# Patient Record
Sex: Male | Born: 1955 | Hispanic: No | Marital: Married | Smoking: Never smoker
Health system: Southern US, Community
[De-identification: ages and names within clinical notes are randomized; demographics above are authoritative.]

## PROBLEM LIST (undated history)

## (undated) ENCOUNTER — Inpatient Hospital Stay
Admission: EM | Payer: No Typology Code available for payment source | Source: Home / Self Care | Attending: Student in an Organized Health Care Education/Training Program | Admitting: Student in an Organized Health Care Education/Training Program

## (undated) DIAGNOSIS — I2510 Atherosclerotic heart disease of native coronary artery without angina pectoris: Secondary | ICD-10-CM

## (undated) DIAGNOSIS — E785 Hyperlipidemia, unspecified: Secondary | ICD-10-CM

## (undated) DIAGNOSIS — J302 Other seasonal allergic rhinitis: Secondary | ICD-10-CM

## (undated) DIAGNOSIS — E559 Vitamin D deficiency, unspecified: Secondary | ICD-10-CM

## (undated) DIAGNOSIS — H539 Unspecified visual disturbance: Secondary | ICD-10-CM

## (undated) DIAGNOSIS — H9190 Unspecified hearing loss, unspecified ear: Secondary | ICD-10-CM

## (undated) DIAGNOSIS — I219 Acute myocardial infarction, unspecified: Secondary | ICD-10-CM

## (undated) DIAGNOSIS — D689 Coagulation defect, unspecified: Secondary | ICD-10-CM

## (undated) HISTORY — DX: Vitamin D deficiency, unspecified: E55.9

## (undated) HISTORY — DX: Other seasonal allergic rhinitis: J30.2

## (undated) HISTORY — PX: NOSE SURGERY: SHX723

## (undated) HISTORY — DX: Coagulation defect, unspecified: D68.9

## (undated) HISTORY — DX: Hyperlipidemia, unspecified: E78.5

---

## 2020-10-17 ENCOUNTER — Other Ambulatory Visit: Payer: Self-pay

## 2020-10-17 ENCOUNTER — Ambulatory Visit (INDEPENDENT_AMBULATORY_CARE_PROVIDER_SITE_OTHER): Payer: Medicaid Other | Admitting: Internal Medicine

## 2020-10-17 VITALS — BP 134/69 | HR 98 | Temp 98.0°F | Ht 66.0 in | Wt 174.2 lb

## 2020-10-17 DIAGNOSIS — E7800 Pure hypercholesterolemia, unspecified: Secondary | ICD-10-CM

## 2020-10-17 DIAGNOSIS — Z0000 Encounter for general adult medical examination without abnormal findings: Secondary | ICD-10-CM | POA: Diagnosis present

## 2020-10-17 DIAGNOSIS — E559 Vitamin D deficiency, unspecified: Secondary | ICD-10-CM

## 2020-10-17 DIAGNOSIS — N3943 Post-void dribbling: Secondary | ICD-10-CM

## 2020-10-17 DIAGNOSIS — H9192 Unspecified hearing loss, left ear: Secondary | ICD-10-CM

## 2020-10-17 LAB — POCT GLYCOSYLATED HEMOGLOBIN (HGB A1C): Hemoglobin A1C: 5.2 % (ref 4.0–5.6)

## 2020-10-17 LAB — GLUCOSE, CAPILLARY: Glucose-Capillary: 88 mg/dL (ref 70–99)

## 2020-10-17 NOTE — Patient Instructions (Signed)
Thank you for allowing [place] to provide your care today. Today you came in for check up and esrablish care with [place].  Your heart and lung exam are normal.   I order some blood work and will call you with the result. I refer you to ear-nose-throat (ENT) doctor for your hearing loss  Please come back to clinic in clinic in 3 months to follow up with your PCP or sooner as needed. As always, if having severe symptoms, please seek medical attention at emergency room. Should you have any questions or concerns please call the internal medicine clinic at [phone].    Thank you!    ?~    ?    ?    ? ?.  ? ?     ?.  ?   ?  ?? .     ?   ?     ? ?.    ? ~ ??  ~     ??  ?    3  ?  ~??~  ~??~  ~? .  ?     ?   Z  ~?.      ? ?  ~??~ ?   [phone]  ??.
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
## 2020-10-18 LAB — LIPID PANEL
Chol/HDL Ratio: 4.8 ratio (ref 0.0–5.0)
Cholesterol, Total: 207 mg/dL — ABNORMAL HIGH (ref 100–199)
HDL: 43 mg/dL (ref >39)
LDL Chol Calc (NIH): 134 mg/dL — ABNORMAL HIGH (ref 0–99)
Triglycerides: 165 mg/dL — ABNORMAL HIGH (ref 0–149)
VLDL Cholesterol Cal: 30 mg/dL (ref 5–40)

## 2020-10-18 LAB — PSA: Prostate Specific Ag, Serum: 3.2 ng/mL (ref 0.0–4.0)

## 2020-10-18 LAB — VITAMIN D 25 HYDROXY (VIT D DEFICIENCY, FRACTURES): Vit D, 25-Hydroxy: 24.9 ng/mL — ABNORMAL LOW (ref 30.0–100.0)

## 2020-10-19 DIAGNOSIS — Z0000 Encounter for general adult medical examination without abnormal findings: Secondary | ICD-10-CM | POA: Insufficient documentation

## 2020-10-19 DIAGNOSIS — N3943 Post-void dribbling: Secondary | ICD-10-CM | POA: Insufficient documentation

## 2020-10-19 DIAGNOSIS — H9192 Unspecified hearing loss, left ear: Secondary | ICD-10-CM | POA: Insufficient documentation

## 2020-10-19 NOTE — Assessment & Plan Note (Signed)
Patient reports some urinary dribbling at end of urination. Denies any frequency during the day. No difficulty in initiating urination. He would like to have his PSA checked. Explained that we do not need it as a screen test for BPH but will go ahead and order that per patient's request.   -PSA
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
## 2020-10-19 NOTE — Assessment & Plan Note (Addendum)
No lab in the chart and patient states that he has not seen a doctor in past few years for check up. Will order some screeing test. He is outdate for all age appropriate vaccination including COVID.  -HbA1c -Lipid panel -Vit D level -Will discuss screening colonoscopy next visit

## 2020-10-21 ENCOUNTER — Other Ambulatory Visit (HOSPITAL_COMMUNITY): Payer: Self-pay | Admitting: Internal Medicine

## 2020-10-21 MED ORDER — ROSUVASTATIN CALCIUM 20 MG PO TABS: 20.0000 mg | ORAL_TABLET | Freq: Every day | ORAL | 5 refills | Status: DC

## 2020-10-21 MED ORDER — VITAMIN D 50 MCG (2000 UT) PO TABS: 2000.0000 [IU] | ORAL_TABLET | Freq: Every day | ORAL | 1 refills | Status: DC

## 2020-10-21 MED FILL — ROSUVASTATIN CALCIUM 20 MG: 20 | 60 days supply | Qty: 60 | Fill #0

## 2020-10-21 NOTE — Addendum Note (Signed)
Addended byChevis Pretty on: 10/21/2020 09:36 AM   Modules accepted: Orders

## 2020-10-22 NOTE — Progress Notes (Signed)
Internal Medicine Clinic Attending  Case discussed with Dr. Basaraba  At the time of the visit.  We reviewed the resident's history and exam and pertinent patient test results.  I agree with the assessment, diagnosis, and plan of care documented in the resident's note.  

## 2020-10-29 NOTE — Congregational Nurse Program (Signed)
Patient came in today to discuss referrals that were sent out at his last PCP visit on 10/17/2020. He states that he would like to see his PCP again and that the 3 month follow up visit is too far away.   Per the last OV note, the patient has decreased hearing in his left ear and a referral to ENT (Dr. Suszanne Conners) was made. It was formally approved to be sent out on 2/10 because patient's Medicaid needed to be changed to [facility]. Called Dr. Avel Sensor office this morning and their office said that they don't take [facility] Medicaid. Spoke to Referral Coordinator at [facility] who said that they were aware of the problem and had put in a new request to a different office. Will have an appointment next week and that the sponsor will be notified.   Arman Bogus RN BSn PCCN  [facility]regational Nurse [phone]-cell [phone]-office

## 2020-11-11 ENCOUNTER — Telehealth: Payer: Self-pay | Admitting: Pediatric Intensive Care

## 2020-11-11 NOTE — Telephone Encounter (Signed)
Call to [facility] regarding client's ENT referral. Spoke with May. She will send message to referral coordinator to return call. Shann Medal Rn BSN CNP [phone]

## 2020-11-19 ENCOUNTER — Telehealth: Payer: Self-pay | Admitting: Pediatric Intensive Care

## 2020-11-25 ENCOUNTER — Encounter: Payer: Self-pay | Admitting: Pediatric Intensive Care

## 2020-11-25 NOTE — Congregational Nurse Program (Signed)
  Dept: [phone]   Congregational Nurse Program Note  Date of Encounter: 11/25/2020  Past Medical History: No past medical history on file.  Encounter Details:Spoke with client at [facility]- client states he wants a re-check on his cholesterol level. CN reviewed PCP appointment with client and recommends that he has a level drwn at that appointment. Steven Medal RN BSN CNP [phone]

## 2020-12-02 ENCOUNTER — Telehealth: Payer: Self-pay | Admitting: Pediatric Intensive Care

## 2020-12-02 NOTE — Telephone Encounter (Signed)
Call to [facility]- spoke with French Ana. Client's appointment for hearing screen is at  0920 on 3/24 arrive 30 minutes early. Hearing testing is first appointment with ENT to follow at 1000. CN advised clinic of language needs.  Shann Medal RN BSN CNP [phone]

## 2020-12-02 NOTE — Congregational Nurse Program (Signed)
Confirmed appointment for patient at [facility] at 930 12/04/20. Patient's spouse was unsure of date/time of appointment  Arman Bogus RN BSn [facility] Congregational Nurse [phone]-cell [phone]-office

## 2020-12-09 ENCOUNTER — Encounter: Payer: Self-pay | Admitting: Pediatric Intensive Care

## 2020-12-09 NOTE — Congregational Nurse Program (Signed)
  Dept: [phone]   Congregational Nurse Program Note  Date of Encounter: 12/09/2020  Past Medical History: No past medical history on file.  Encounter Details: Patient wanted to recheck blood pressure. Will see Benetta Spar, RN Congregational Nurse to coordinate upcoming appointments.  Arman Bogus RN BSn PCCN  [facility] [phone]-cell [phone]-office

## 2020-12-16 ENCOUNTER — Telehealth: Payer: Self-pay | Admitting: Pediatric Intensive Care

## 2020-12-16 NOTE — Congregational Nurse Program (Signed)
  Dept: [phone]   Congregational Nurse Program Note  Date of Encounter: 12/09/2020  Past Medical History: No past medical history on file.  Encounter Details: Client in to remind CN that he would like his cholesterol re-checked when he gos to his wellness follow up.  Shann Medal RN BSN CNP [phone]

## 2020-12-16 NOTE — Telephone Encounter (Signed)
Call to [facility]. Appointment scheduled for 12/30/2020 at 1200. Shann Medal RN BSN CNP [phone]

## 2020-12-17 NOTE — Telephone Encounter (Signed)
Client left message for CN to return call. Shann Medal RN BSN CNP [phone]

## 2021-01-14 ENCOUNTER — Telehealth: Payer: Self-pay | Admitting: *Deleted

## 2021-01-14 ENCOUNTER — Encounter: Payer: Medicaid Other | Admitting: Student

## 2021-01-14 NOTE — Telephone Encounter (Signed)
Interpretor attempted to contact pt regarding missed appt-no ans-unable to leave message.Steven Spittle Cassady5/4/202211:56 AM

## 2021-01-28 NOTE — Congregational Nurse Program (Signed)
  Dept: [phone]   Congregational Nurse Program Note  Date of Encounter: 01/28/2021  Past Medical History: No past medical history on file.  Encounter Details:  CNP Questionnaire - 01/28/21 1159      Questionnaire   Do you give verbal consent to treat you today? Yes    Visit Setting Church or Organization    Location Patient Served At [facility]    Patient Status Refugee    Insurance Medicaid    Intervention Advocate;Assess (including screenings);Counsel;Educate;Refer    Transportation Need transportation assistance    Medication Have medication insecurities    Referrals PCP - [place]    ED Visit Averted Yes          Steven Malone would like to rescheduled a missed appointment. PCP office contacted and message left for the coordinator to call me back with appoint date and time. Arman Bogus RN BSn PCCN  [facility] [phone]-cell [phone]-office

## 2021-02-09 ENCOUNTER — Encounter (HOSPITAL_COMMUNITY): Payer: Self-pay | Admitting: Emergency Medicine

## 2021-02-09 ENCOUNTER — Emergency Department (HOSPITAL_COMMUNITY)
Admission: EM | Admit: 2021-02-09 | Discharge: 2021-02-09 | Disposition: A | Discharge status: Home / Self Care | Payer: Medicaid Other | Attending: Emergency Medicine | Admitting: Emergency Medicine

## 2021-02-09 ENCOUNTER — Emergency Department (HOSPITAL_COMMUNITY): Payer: Medicaid Other

## 2021-02-09 ENCOUNTER — Telehealth (HOSPITAL_COMMUNITY): Payer: Self-pay | Admitting: Medical

## 2021-02-09 ENCOUNTER — Other Ambulatory Visit: Payer: Self-pay

## 2021-02-09 DIAGNOSIS — R519 Headache, unspecified: Secondary | ICD-10-CM | POA: Diagnosis present

## 2021-02-09 DIAGNOSIS — R930 Abnormal findings on diagnostic imaging of skull and head, not elsewhere classified: Secondary | ICD-10-CM

## 2021-02-09 DIAGNOSIS — J0110 Acute frontal sinusitis, unspecified: Secondary | ICD-10-CM | POA: Diagnosis not present

## 2021-02-09 MED ORDER — METOCLOPRAMIDE HCL 5 MG/ML IJ SOLN
10.0000 mg | Freq: Once | INTRAMUSCULAR | Status: AC
  Administered 2021-02-09: 10 mg via INTRAVENOUS
  Filled 2021-02-09: qty 2

## 2021-02-09 MED ORDER — FLUTICASONE PROPIONATE 50 MCG/ACT NA SUSP: 1.0000 | Freq: Every day | NASAL | 0 refills | Status: AC

## 2021-02-09 MED ORDER — FLUORESCEIN SODIUM 1 MG OP STRP
1.0000 | ORAL_STRIP | Freq: Once | OPHTHALMIC | Status: AC
  Administered 2021-02-09: 1 via OPHTHALMIC
  Filled 2021-02-09: qty 1

## 2021-02-09 MED ORDER — CETIRIZINE HCL 10 MG PO TABS: 10.0000 mg | ORAL_TABLET | Freq: Every day | ORAL | 0 refills | Status: DC

## 2021-02-09 MED ORDER — DIPHENHYDRAMINE HCL 50 MG/ML IJ SOLN
50.0000 mg | Freq: Once | INTRAMUSCULAR | Status: AC
  Administered 2021-02-09: 50 mg via INTRAVENOUS
  Filled 2021-02-09: qty 1

## 2021-02-09 MED ORDER — TETRACAINE HCL 0.5 % OP SOLN
2.0000 [drp] | Freq: Once | OPHTHALMIC | Status: AC
  Administered 2021-02-09: 2 [drp] via OPHTHALMIC
  Filled 2021-02-09: qty 4

## 2021-02-09 MED ORDER — CETIRIZINE HCL 10 MG PO TABS: 10.0000 mg | ORAL_TABLET | Freq: Every day | ORAL | 0 refills | Status: AC

## 2021-02-09 MED ORDER — AMOXICILLIN-POT CLAVULANATE 875-125 MG PO TABS: 1.0000 | ORAL_TABLET | Freq: Two times a day (BID) | ORAL | 0 refills | Status: AC

## 2021-02-09 MED ORDER — FLUTICASONE PROPIONATE 50 MCG/ACT NA SUSP: 1.0000 | Freq: Every day | NASAL | 0 refills | Status: DC

## 2021-02-09 MED ORDER — SODIUM CHLORIDE 0.9 % IV BOLUS
1000.0000 mL | Freq: Once | INTRAVENOUS | Status: AC
  Administered 2021-02-09: 1000 mL via INTRAVENOUS

## 2021-02-09 NOTE — ED Provider Notes (Signed)
[facility]-EMERGENCY DEPT Provider Note   CSN: [id number] Arrival date & time: 02/09/21  1058     History Chief Complaint  Patient presents with  . Eye Pain    Steven Malone is a 65 y.o. male who presents to the ED today with complaint of gradual onset, constant, achy/throbbing, severe, pain around his left eye mostly in the left frontal area that began yesterday around noon. Pt denies any specific eye pain itself, foreign body sensation, vision changes. No trauma to the eye. He mentions that he had a surgery to bilateral eyes in [place] 2 years ago and was concerned; he is unsure what surgery he had specifically but mentions getting his lenses replaced. He took 600 mg Ibuprofen without much relief yesterday prompting ED visit today. He has never experienced this pain in the past. No hx of migraine headaches. Son reports that besides acting painful pt is as baseline. Pt denies fevers, chills, photophobia, phonophobia, nausea, vomiting, confusion, speech changes, unilateral weakness or numbness, or any other associated symptoms.   The history is provided by the patient, a relative and medical records. The history is limited by a language barrier. A language interpreter was used.       History reviewed. No pertinent past medical history.  Patient Active Problem List   Diagnosis Date Noted  . Encounter for preventive care 10/19/2020  . Urinary dribbling 10/19/2020  . Hearing decreased, left 10/19/2020    History reviewed. No pertinent family history.     Home Medications Prior to Admission medications   Medication Sig Start Date End Date Taking? Authorizing Provider  amoxicillin-clavulanate (AUGMENTIN) 875-125 MG tablet Take 1 tablet by mouth every 12 (twelve) hours. 02/09/21  Yes Maryna Yeagle, PA-C  cetirizine (ZYRTEC ALLERGY) 10 MG tablet Take 1 tablet (10 mg total) by mouth daily. 02/09/21 03/11/21 Yes Caidyn Blossom, PA-C  fluticasone (FLONASE)  50 MCG/ACT nasal spray Place 1 spray into both nostrils daily. 02/09/21  Yes Kristelle Cavallaro, PA-C  Cholecalciferol (VITAMIN D) 50 MCG (2000 UT) tablet Take 1 tablet (2,000 Units total) by mouth daily. 10/21/20   Masoudi, Shawna [place], MD  Cholecalciferol (VITAMIN D3) 50 MCG (2000 UT) TABS TAKE 1 TABLET (2,000 UNITS TOTAL) BY MOUTH DAILY. 10/21/20 10/21/21  Masoudi, Elhamalsadat, MD  rosuvastatin (CRESTOR) 20 MG tablet TAKE 1 TABLET (20 MG TOTAL) BY MOUTH DAILY. 10/21/20 10/21/21  MasoudiShawna [place], MD    Allergies    Patient has no known allergies.  Review of Systems   Review of Systems  Constitutional: Negative for chills and fever.  Eyes: Negative for photophobia, discharge, redness, itching and visual disturbance.  Respiratory: Negative for cough.   Gastrointestinal: Negative for nausea and vomiting.  Musculoskeletal: Negative for myalgias.  Neurological: Positive for headaches. Negative for syncope, speech difficulty, weakness and numbness.  All other systems reviewed and are negative.   Physical Exam Updated Vital Signs BP (!) 131/91 (BP Location: Right Arm)   Pulse (!) 59   Temp 98 F (36.7 C) (Oral)   Resp 16   SpO2 98%   Physical Exam Vitals and nursing note reviewed.  Constitutional:      Appearance: He is not ill-appearing or diaphoretic.  HENT:     Head: Normocephalic and atraumatic.  Eyes:     General: No scleral icterus.       Right eye: No discharge.        Left eye: No discharge.     Intraocular pressure: Left eye pressure is 21 mmHg.  Measurements were taken using a handheld tonometer.    Extraocular Movements: Extraocular movements intact.     Conjunctiva/sclera: Conjunctivae normal.     Pupils: Pupils are equal, round, and reactive to light.     Left eye: No corneal abrasion or fluorescein uptake.  Cardiovascular:     Rate and Rhythm: Normal rate and regular rhythm.     Pulses: Normal pulses.  Pulmonary:     Effort: Pulmonary effort is normal.     Breath  sounds: Normal breath sounds. No wheezing, rhonchi or rales.  Abdominal:     Palpations: Abdomen is soft.     Tenderness: There is no abdominal tenderness. There is no guarding or rebound.  Musculoskeletal:     Cervical back: Neck supple.  Skin:    General: Skin is warm and dry.  Neurological:     Mental Status: He is alert.     Comments: Alert and oriented to self, place, time and event.   Speech is fluent, clear without dysarthria or dysphasia.   Strength 5/5 in upper/lower extremities  Sensation intact in upper/lower extremities   Normal gait.  Negative Romberg. No pronator drift.  Normal finger-to-nose and feet tapping.  CN I not tested  CN II grossly intact visual fields bilaterally. Did not visualize posterior eye.   CN III, IV, VI PERRLA and EOMs intact bilaterally  CN V Intact sensation to sharp and light touch to the face  CN VII facial movements symmetric  CN VIII not tested  CN IX, X no uvula deviation, symmetric rise of soft palate  CN XI 5/5 SCM and trapezius strength bilaterally  CN XII Midline tongue protrusion, symmetric L/R movements      ED Results / Procedures / Treatments   Labs (all labs ordered are listed, but only abnormal results are displayed) Labs Reviewed - No data to display  EKG None  Radiology CT Head Wo Contrast  Result Date: 02/09/2021 CLINICAL DATA:  Headache. EXAM: CT HEAD WITHOUT CONTRAST TECHNIQUE: Contiguous axial images were obtained from the base of the skull through the vertex without intravenous contrast. COMPARISON:  None. FINDINGS: Brain: No evidence of acute infarction, hemorrhage, hydrocephalus, extra-axial collection or mass lesion/mass effect. Vascular: No hyperdense vessel or unexpected calcification. Skull: Normal. Negative for fracture or focal lesion. Sinuses/Orbits: Globes and orbits unremarkable. Left frontal sinus is opacified. Moderate ethmoid mucosal thickening. Mild maxillary and sphenoid mucosal thickening. Other:  None. IMPRESSION: 1.  No intracranial abnormality. 2.  Sinus disease. Electronically Signed   By: Amie Portlandavid  Ormond M.D.   On: 02/09/2021 14:09    Procedures Procedures   Medications Ordered in ED Medications  fluorescein ophthalmic strip 1 strip (1 strip Left Eye Given by Other 02/09/21 1356)  tetracaine (PONTOCAINE) 0.5 % ophthalmic solution 2 drop (2 drops Left Eye Given by Other 02/09/21 1356)  metoCLOPramide (REGLAN) injection 10 mg (10 mg Intravenous Given 02/09/21 1308)  diphenhydrAMINE (BENADRYL) injection 50 mg (50 mg Intravenous Given 02/09/21 1309)  sodium chloride 0.9 % bolus 1,000 mL (1,000 mLs Intravenous New Bag/Given 02/09/21 1344)    ED Course  I have reviewed the triage vital signs and the nursing notes.  Pertinent labs & imaging results that were available during my care of the patient were reviewed by me and considered in my medical decision making (see chart for details).    MDM Rules/Calculators/A&P  65 year old Dari speaking male who presents to the ED today complaining of left-sided pain specifically around his eye and into his forehead that began yesterday.  Chief complaint redness eye pain however patient denies any specific eye pain itself and states the pain is only around the eye.  He denies any foreign body sensation.  Denies any visual changes.  Past surgical history of what appears to be possible cataract surgery and lens replacement bilaterally 2 years ago.  On arrival to the ED vitals are stable.  Patient is afebrile, nontachycardic nontachypneic and appears to be in no acute distress.  On my exam there is no conjunctival injection bilaterally, no drainage.  His pupils are equal round and reactive to light.  He denies any pain with extraocular movements.  No signs of preseptal or septal cellulitis at this time.  He has no focal neurodeficits on exam today no meningeal signs.  He denies any other associated symptoms at this time however his  symptoms do seem more like a headache versus eye pain.  Nonetheless it is difficult to assess whether all of the information is accurate due to using an interpreter at this time.  We will plan to assess eye with fluorescein stain as well as Tono-Pen.  Will obtain visual acuity at this time.  We will plan to treat for possible migraine headache with fluids and medications.  Given age and patient never experiencing these symptoms in the past we will plan for CT head at this time.   No fluorescein uptake on exam. Normal IOP.  Prior to CT scan pt received benadryl and reglan and reports significant improvement in his headache. Will await CT results and provide Toradol and fluids with reevaluation.   CT Head: Left frontal sinus is opacified. Moderate ethmoid mucosal  thickening. Mild maxillary and sphenoid mucosal thickening.   Suspect sinus infection is causing pt's discomfort today. Wife reports he has had a lot of issues with "allergies" in the past consistent with sneezing and congestion. Will treat with abx at this time. Pt advised to follow up with PCP for same. They would also like a referral to an ophthalmologist in the area given hx of [place] 2 [place]; will provide information. Pt stable for discharge at this time.   This note was prepared using Dragon voice recognition software and may include unintentional dictation errors due to the inherent limitations of voice recognition software.  Final Clinical Impression(s) / ED Diagnoses Final diagnoses:  Opacification of frontal sinus  Acute frontal sinusitis, recurrence not specified  Bad headache    Rx / DC Orders ED Discharge Orders         Ordered    amoxicillin-clavulanate (AUGMENTIN) 875-125 MG tablet  Every 12 hours        02/09/21 1435    fluticasone (FLONASE) 50 MCG/ACT nasal spray  Daily        02/09/21 1435    cetirizine (ZYRTEC ALLERGY) 10 MG tablet  Daily        02/09/21 1435            Discharge Instructions     Your CT scan showed mucosal thickening and opacification of your sinuses on the left side of your face which is likely causing your headache.   Please pick up antibiotics and take as prescribed for a sinus infection. I have also prescribed Flonase and Zyrtec which will help dry out your sinuses and take care of allergies.  Follow up with Ear Nose and Throat specialist Dr. Marene Lenz for further evaluation of your sinus infection  I have also provided information for an ophthalmologist Dr. Cathey Endow that you can follow up with as needed given previous eye surgery history.   Return to the ED as needed for any new/worsening symptoms       Tanda Rockers, Cordelia Poche 02/09/21 1437    Benjiman Core, MD 02/09/21 [phone]

## 2021-02-09 NOTE — Discharge Instructions (Signed)
Your CT scan showed mucosal thickening and opacification of your sinuses on the left side of your face which is likely causing your headache.   Please pick up antibiotics and take as prescribed for a sinus infection. I have also prescribed Flonase and Zyrtec which will help dry out your sinuses and take care of allergies.   Follow up with Ear Nose and Throat specialist Dr. Marene Lenz for further evaluation of your sinus infection  I have also provided information for an ophthalmologist Dr. Cathey Endow that you can follow up with as needed given previous eye surgery history.   Return to the ED as needed for any new/worsening symptoms

## 2021-02-09 NOTE — ED Triage Notes (Signed)
Pt arrives pov with complaints of severe eye pain on L side. Pt states pain is above and around eye. Pt had operation on eye 2 years ago.

## 2021-02-11 ENCOUNTER — Encounter: Payer: Self-pay | Admitting: Pediatric Intensive Care

## 2021-02-18 ENCOUNTER — Encounter: Payer: Self-pay | Admitting: Pediatric Intensive Care

## 2021-02-18 NOTE — Congregational Nurse Program (Signed)
  Dept: [phone]   Congregational Nurse Program Note  Date of Encounter: 02/11/2021  Past Medical History: No past medical history on file.  Encounter Details:Client visit at [facility]. He has his audiology exam and requests assistance with making an appointment with [facility] through [facility]. CN will contact number provided by client. Shann Medal RN BSN CNP [phone]

## 2021-02-24 NOTE — Congregational Nurse Program (Signed)
  Dept: [phone]   Congregational Nurse Program Note  Date of Encounter: 02/24/2021  Past Medical History: No past medical history on file.  Encounter Details:  Mr Langhans is requesting transportation assistance. He has appointment today at 2:15pm at [facility].  [facility] services scheduled a ride with pick up time 1:40pm. Patient understands instructions and that the taxi will wait for only 5 minutes and he should be ready prior to pick up time.  Arman Bogus RN BSn PCCN  [facility] [phone]-cell [phone]-office

## 2021-03-03 ENCOUNTER — Encounter: Payer: Self-pay | Admitting: Pediatric Intensive Care

## 2021-03-03 ENCOUNTER — Telehealth: Payer: Self-pay | Admitting: Pediatric Intensive Care

## 2021-03-03 NOTE — Telephone Encounter (Signed)
Call to transportation services to set up ride to [facility] appointment for this afternoon at 1600. Shann Medal RN BSN CNP [phone]

## 2021-03-04 ENCOUNTER — Telehealth: Payer: Self-pay | Admitting: Pediatric Intensive Care

## 2021-03-04 NOTE — Telephone Encounter (Signed)
Call to [facility]- client missed appointment in May and needs follow up. Schedule is not open at this time. Call back 7/5 to make appointment. Shann Medal Rn BSN CNP [phone]

## 2021-03-11 ENCOUNTER — Encounter: Payer: Self-pay | Admitting: Pediatric Intensive Care

## 2021-03-12 NOTE — Congregational Nurse Program (Signed)
  Dept: [phone]   Congregational Nurse Program Note  Date of Encounter: 02/18/2021  Past Medical History: No past medical history on file.  Encounter Details: Encounter to assist client with application for hearing aid. CN advised client that [facility] will call tomorrow at 1000 to screen for eligibility. Shann Medal RN BSN CNP [phone]

## 2021-03-12 NOTE — Congregational Nurse Program (Signed)
  Dept: [phone]   Congregational Nurse Program Note  Date of Encounter: 03/11/2021  Past Medical History: No past medical history on file.  Encounter Details: CN advised client that she is working with his CM at [facility] to schedule a dental appointment. Also explained that CN will call [facility] next week to schedule follow up appointment with PCP. Shann Medal RN BSN CNP [phone]

## 2021-03-12 NOTE — Congregational Nurse Program (Signed)
  Dept: [phone]   Congregational Nurse Program Note  Date of Encounter: 03/03/2021  Past Medical History: No past medical history on file.  Encounter Details: Encounter with Dari interpreter provided by [facility]. Client has appointment scheduled with [facility] this afternoon. He has continued complaints of dental pain on left side. CN advised she will call dental primary to determine when client needs to return to oral surgeon. Shann Medal RN BSN CNP [phone]

## 2021-03-25 ENCOUNTER — Telehealth: Payer: Self-pay | Admitting: Pediatric Intensive Care

## 2021-03-25 NOTE — Telephone Encounter (Signed)
Call to [facility] to schedule follow up appointment for client. Appointment made for 7/26 at 2:15.  Shann Medal RN BSN CNP [phone]

## 2021-04-06 ENCOUNTER — Telehealth: Payer: Self-pay | Admitting: Pediatric Intensive Care

## 2021-04-06 NOTE — Telephone Encounter (Signed)
Left message for client tha this CN has scheduled an [employer]oad ride for his appointment tomorrow at [facility]. Shann Medal RN BSN CNP [phone]

## 2021-04-06 NOTE — Telephone Encounter (Signed)
Call to client's wide to confirm appointment for client tomorrow. CN advised that Benedetto Goad ride is arranged. Shann Medal RN BSN CNP [phone]

## 2021-04-07 ENCOUNTER — Telehealth: Payer: Self-pay | Admitting: Pediatric Intensive Care

## 2021-04-07 ENCOUNTER — Other Ambulatory Visit (HOSPITAL_COMMUNITY): Payer: Self-pay

## 2021-04-07 ENCOUNTER — Ambulatory Visit (INDEPENDENT_AMBULATORY_CARE_PROVIDER_SITE_OTHER): Payer: Medicaid Other | Admitting: Internal Medicine

## 2021-04-07 ENCOUNTER — Encounter: Payer: Self-pay | Admitting: Internal Medicine

## 2021-04-07 VITALS — BP 102/71 | HR 62 | Temp 97.5°F | Ht 66.0 in | Wt 166.5 lb

## 2021-04-07 DIAGNOSIS — Z23 Encounter for immunization: Secondary | ICD-10-CM | POA: Diagnosis present

## 2021-04-07 DIAGNOSIS — H9192 Unspecified hearing loss, left ear: Secondary | ICD-10-CM | POA: Diagnosis not present

## 2021-04-07 DIAGNOSIS — Z1159 Encounter for screening for other viral diseases: Secondary | ICD-10-CM

## 2021-04-07 DIAGNOSIS — E785 Hyperlipidemia, unspecified: Secondary | ICD-10-CM

## 2021-04-07 DIAGNOSIS — E7800 Pure hypercholesterolemia, unspecified: Secondary | ICD-10-CM

## 2021-04-07 DIAGNOSIS — Z0000 Encounter for general adult medical examination without abnormal findings: Secondary | ICD-10-CM

## 2021-04-07 DIAGNOSIS — E559 Vitamin D deficiency, unspecified: Secondary | ICD-10-CM

## 2021-04-07 MED ORDER — VITAMIN D 50 MCG (2000 UT) PO TABS
2000.0000 [IU] | ORAL_TABLET | Freq: Every day | ORAL | 3 refills | Status: DC
  Filled 2021-04-07: qty 60, 60d supply, fill #0

## 2021-04-07 MED ORDER — ZOSTER VAC RECOMB ADJUVANTED 50 MCG/0.5ML IM SUSR
0.5000 mL | Freq: Once | INTRAMUSCULAR | 0 refills | Status: DC
  Filled 2021-04-07 (×2): qty 0.5, 1d supply, fill #0

## 2021-04-07 MED ORDER — ROSUVASTATIN CALCIUM 20 MG PO TABS
20.0000 mg | ORAL_TABLET | Freq: Every day | ORAL | 3 refills | Status: AC
  Filled 2021-04-07 (×2): qty 90, 90d supply, fill #0

## 2021-04-07 MED ORDER — ROSUVASTATIN CALCIUM 20 MG PO TABS
20.0000 mg | ORAL_TABLET | Freq: Every day | ORAL | 5 refills | Status: DC
  Filled 2021-04-07: qty 60, 60d supply, fill #0

## 2021-04-07 MED ORDER — VITAMIN D 50 MCG (2000 UT) PO TABS
2000.0000 [IU] | ORAL_TABLET | Freq: Every day | ORAL | 3 refills | Status: AC
  Filled 2021-04-07: qty 90, 90d supply, fill #0

## 2021-04-07 MED ORDER — ZOSTER VAC RECOMB ADJUVANTED 50 MCG/0.5ML IM SUSR
0.5000 mL | INTRAMUSCULAR | 1 refills | Status: AC
  Filled 2021-04-07: qty 0.5, 60d supply, fill #0
  Filled 2021-04-07: qty 0.5, 1d supply, fill #0

## 2021-04-07 NOTE — Patient Instructions (Signed)
?? ????? ????? ?? ?????? ????? ?????? ????? ??????. ?? ?????? ??? ???????! ????? ?? ???? ????? ????? ??? ?? ???? ????? ?????????? ????? ??????? ? ?????????? ?? ???? ??? ????? ???? ? ????? ?? ?? ???? ?? ??? ?????? ?????? ???? ?????. ???? ??????? ?? ?? ???? ??? ??? ?? ?????? ?? ????? ?? ????? ? ???? ?? ????. ?? ??? ???? ????? ????!  ?? ????? ??? ?? ???? ??? ????? ???? ??:  ??????? ??????????: ???? ?????: ???? ????? ??????? ???????? ??????   C  ????? ??? ????? ??? ?????: ????? ???? ????? ???????  ???????: ?????? ????? ???? ???????? ???????????  ??????? ??????: ??? ??  ??????: ?? 6 ???.  ?? ??? ????? ?????: ?? ???? ????? ?? ???? ???? ?? ??????? ????? ?? ?????? ?? ?? ????? [phone] ??? ???? 9 ??? ?? 5 ???????? ? ??? ?? ????? ???? ?? ????? [phone] ???? ?????? ? ?? ?????? ????? ?? ???? ???????. ??? ????? ?? ???? ?? ?? ??? ??????? ????? ?????? ???? ?? 911 ???? ??????.  ????? ???? DO   Thank you for visiting the Internal Medicine Clinic today. It was a pleasure to meet you! Today we discussed your general health including previous lab results, different vaccines and tests that you are due for, and how well you are taking care of yourself overall. I am so glad to hear that you are eating well, drinking a lot of water, and exercising. Keep up the great work!  I have ordered the following for you:  Lab orders: Lipid panel: to check your cholesterol Hepatitis C screening  Vaccines given today: Tetanus shot Pneumonia shot  Referrals: Gastroenterology for a screening colonoscopy  Medication changes: None  Follow-up: In 6 months.  Remember: If you have any questions or concerns, please call our clinic at [phone] between 9am-5pm and after hours call [phone] and ask for the internal medicine resident on call. If you feel you are having a medical emergency please call 911.  Champ Mungo, DO

## 2021-04-07 NOTE — Progress Notes (Signed)
   CC: check-up  HPI:  Mr.Steven Malone is a 65 y.o. male with a past medical history of hyperlipidemia and vitamin D deficiency who presents today for a follow-up and requesting medication refills. He has no complaints at this time. His only concern is making sure his lab work is up to date.  Past Medical History:  Diagnosis Date   Hyperlipidemia    Vitamin D deficiency    Review of Systems:  Review of Systems  Constitutional:  Negative for chills and fever.  HENT:  Positive for hearing loss.   Respiratory:  Negative for shortness of breath.   Cardiovascular:  Negative for chest pain and leg swelling.  Gastrointestinal:  Negative for abdominal pain, constipation, diarrhea, nausea and vomiting.  Genitourinary:  Negative for dysuria.  Musculoskeletal:  Negative for myalgias.  Neurological:  Negative for dizziness and headaches.    Physical Exam:  Vitals:   04/07/21 1420  BP: 102/71  Pulse: 62  Temp: (!) 97.5 F (36.4 C)  TempSrc: Oral  SpO2: 98%  Weight: 166 lb 8 oz (75.5 kg)  Height: 5\' 6"  (1.676 m)   Physical Exam Vitals and nursing note reviewed.  Constitutional:      Appearance: Normal appearance.  Cardiovascular:     Rate and Rhythm: Normal rate and regular rhythm.     Heart sounds: Normal heart sounds.  Pulmonary:     Effort: Pulmonary effort is normal.     Breath sounds: Normal breath sounds.  Abdominal:     General: Abdomen is flat.     Palpations: Abdomen is soft.  Skin:    General: Skin is warm and dry.  Neurological:     General: No focal deficit present.     Mental Status: He is alert and oriented to person, place, and time.  Psychiatric:        Mood and Affect: Mood normal.        Behavior: Behavior normal.     Assessment & Plan:   See Encounters Tab for problem based charting.  Patient discussed with Dr. 

## 2021-04-07 NOTE — Telephone Encounter (Signed)
Call to transportation services to set up ride to dental appointment.  Shann Medal RN BSN CNP [phone]

## 2021-04-07 NOTE — Telephone Encounter (Signed)
Received message from client that he has a dental appointment scheduled for today at 1230. He is concerned that he will not be able to go to his dental appointment and his PCP appointment this afternoon. CN will call the dentists to determine the length of the appointment. Shann Medal RN BSN CNP [phone]

## 2021-04-07 NOTE — Assessment & Plan Note (Signed)
Tdap given in clinic today.

## 2021-04-07 NOTE — Assessment & Plan Note (Signed)
Patient followed by ENT, audiology.

## 2021-04-07 NOTE — Assessment & Plan Note (Addendum)
Reassured patient that PSA to check his prostate and Hb A1c to check for diabetes were normal when they were checked in February, and that these are labs we will be happy to monitor for him.  Patient needs following care gap resolution today: Referral to GI for colonoscopy

## 2021-04-07 NOTE — Assessment & Plan Note (Signed)
Hepatitis C screening labs drawn today.

## 2021-04-07 NOTE — Assessment & Plan Note (Signed)
Vitamin D was 24.9 in 10/2020. Patient states that at this time he was experiencing leg pain that resolved with initiation of vitamin D supplementation. Will continue him on vitamin D supplementation.

## 2021-04-07 NOTE — Assessment & Plan Note (Signed)
Prevnar 20 given today. 

## 2021-04-07 NOTE — Assessment & Plan Note (Addendum)
Patient started on rosuvastatin 20 mg in 10/2020 after he was found to have elevated cholesterol, triglycerides, and LDL. ASCVD is 11.2%.  - Recheck lipid panel - Patient does state that he has been out of his medication for a few weeks. I will send in refills for this today.  Addendum 04/10/2021: LDL mildly elevated at 103. Patient admits to being out of his medication for a few weeks. We will plan to recheck at follow-up visit once he is reestablished on regular dosing.

## 2021-04-07 NOTE — Telephone Encounter (Signed)
Message to client with Benedetto Goad pick up times. CN advised to call/text if difficulty with ride from dentists office. Client understands. Shann Medal RN BSN CNP [phone]

## 2021-04-07 NOTE — Assessment & Plan Note (Signed)
Prescription for shingles vaccine sent to pharmacy.

## 2021-04-07 NOTE — Telephone Encounter (Signed)
Call to client's dentists to confirm appointment and determine appointment length. Client has 30 minute appointment. Shann Medal RN BSN CNP [phone]

## 2021-04-08 ENCOUNTER — Other Ambulatory Visit (HOSPITAL_COMMUNITY): Payer: Self-pay

## 2021-04-08 LAB — LIPID PANEL
Chol/HDL Ratio: 3.5 ratio (ref 0.0–5.0)
Cholesterol, Total: 161 mg/dL (ref 100–199)
HDL: 46 mg/dL (ref >39)
LDL Chol Calc (NIH): 103 mg/dL — ABNORMAL HIGH (ref 0–99)
Triglycerides: 60 mg/dL (ref 0–149)
VLDL Cholesterol Cal: 12 mg/dL (ref 5–40)

## 2021-04-08 LAB — HEPATITIS C ANTIBODY: Hep C Virus Ab: 0.2 s/co ratio (ref 0.0–0.9)

## 2021-04-12 NOTE — Progress Notes (Signed)
Internal Medicine Clinic Attending  I saw and evaluated the patient.  I personally confirmed the key portions of the history and exam documented by Dr.  Dean  and I reviewed pertinent patient test results.  The assessment, diagnosis, and plan were formulated together and I agree with the documentation in the resident's note.  

## 2021-04-15 ENCOUNTER — Other Ambulatory Visit (HOSPITAL_COMMUNITY): Payer: Self-pay

## 2021-05-12 ENCOUNTER — Telehealth: Payer: Self-pay | Admitting: Pediatric Intensive Care

## 2021-05-12 NOTE — Telephone Encounter (Signed)
Call to [facility]/Audiology in [facility]. Spoke with receptionist. Leotis Shames in benefits is calling [facility] to see if client is eligible for screening and hearing aid through Medicaid. CN gave number for CN to return call at [phone]. Shann Medal RN BSN CNP [phone]

## 2021-05-12 NOTE — Telephone Encounter (Signed)
Call from Leotis Shames, Psychiatric nurse. Client is eligible for hearing aids x2 through [facility]. This will require a prior authorization. Client will need to been evaluated in their office. The evaluation appointment is covered through Medicaid benefits. Shann Medal RN BSN CNP [phone]

## 2021-05-12 NOTE — Telephone Encounter (Signed)
Call to [facility]. Spoke with receptionist to determine if client was actually seen for ENT/audiology appointments on Friday in [place] office. She explained that client would be charged for audiology appointment in that office as they don't accept [facility] Medicaid. Client did not have ENT or audiology appointment. His family member was notified that he would have to be evaluated in [place] for the state hearing aid program. Client has appointment with audiology in [facility]. CN will call [place] office regarding appointment. Shann Medal RN BSN CNP [phone]

## 2021-05-13 ENCOUNTER — Telehealth: Payer: Self-pay | Admitting: Pediatric Intensive Care

## 2021-05-13 NOTE — Telephone Encounter (Signed)
Call from Dr Renata Caprice (audiologist) regarding process for securing bilateral hearing aids for client. She explained that [facility] contracts with 3rd party [employer]. Client will need to go to appointment at [facility]. She communicated with Dr Wynona Neat during call. She will submit prior authorization to [facility] based on current exam. Marcy Panning provider will communicate date/time for fitting with CN. Please call [place] office if no response by next Wednesday. Shann Medal RN BSN CNP [phone]

## 2021-05-13 NOTE — Telephone Encounter (Signed)
Call from client's daughter in law. Updated [name] on status of hearing aids based on conversation with Manufacturing systems engineer at [employer]. She will go to appointment with client on 9/6. Shann Medal RN BSN CNP [phone]

## 2021-05-20 ENCOUNTER — Telehealth: Payer: Self-pay | Admitting: Pediatric Intensive Care

## 2021-05-20 NOTE — Telephone Encounter (Signed)
Left message for Dr Renata Caprice, audiologist at [facility]/Audiology in [facility]. Regarding next appointment for client at [facility]. Shann Medal RN BSN CNP [phone]

## 2021-06-04 ENCOUNTER — Telehealth: Payer: Self-pay | Admitting: Pediatric Intensive Care

## 2021-06-04 NOTE — Telephone Encounter (Signed)
Call to [employer], case manager at [facility] She states that client and family said that client has an appointment at [facility] today. Family stated that this may be for hearing aid fitting. CN advised that this office may not do the fitting (per DR Renata Caprice at that office) CN will call [place] office and clarify appointment. Shann Medal RN BSN CNP [phone]

## 2021-06-23 ENCOUNTER — Encounter: Payer: Self-pay | Admitting: Pediatric Intensive Care

## 2021-06-23 NOTE — Congregational Nurse Program (Signed)
  Dept: [phone]   Congregational Nurse Program Note  Date of Encounter: 06/23/2021  Past Medical History: Past Medical History:  Diagnosis Date   Hyperlipidemia    Vitamin D deficiency     Encounter Details: Call to client's dental provider to make appointment for client. Text message to client regarding appointment. Shann Medal RN BSN CCNP [phone]

## 2021-06-25 ENCOUNTER — Telehealth: Payer: Self-pay | Admitting: Pediatric Intensive Care

## 2021-06-25 NOTE — Telephone Encounter (Signed)
Call to Dr Ocie Doyne DDS. This is the number that client provided for dental care. The client is not established at this clinic. First available appointment for new patient is 12/19. Will advise client. Shann Medal RN BSN CNP [phone]

## 2021-07-02 ENCOUNTER — Encounter: Payer: Self-pay | Admitting: Pediatric Intensive Care

## 2021-07-02 NOTE — Congregational Nurse Program (Signed)
  Dept: [phone]   Congregational Nurse Program Note  Date of Encounter: 07/02/2021  Past Medical History: Past Medical History:  Diagnosis Date   Hyperlipidemia    Vitamin D deficiency     Encounter Details: Notified client that Steven Malone ride has been set up for his dental appointment on 10/26. His estimated pickup time in 1230. Client understands. His mobile number has been updated in Epic and transportation database.  Shann Medal RN BSN CCNP [phone]

## 2021-07-08 ENCOUNTER — Encounter: Payer: Self-pay | Admitting: Pediatric Intensive Care

## 2021-07-09 NOTE — Congregational Nurse Program (Signed)
  Dept: [phone]   Congregational Nurse Program Note  Date of Encounter: 07/08/2021  Past Medical History: Past Medical History:  Diagnosis Date   Hyperlipidemia    Vitamin D deficiency     Encounter Details:  CNP Questionnaire - 07/08/21 1340       Questionnaire   Do you give verbal consent to treat you today? Yes    Location Patient Served  [facility]    Visit Setting Phone/Text/Email    Patient Status Refugee    Insurance [facility]    Insurance Referral N/A    Medication N/A    Medical Provider Yes    Screening Referrals N/A    Medical Referral N/A    Medical Appointment Made N/A    Food N/A    Transportation Need transportation assistance;Provided transportation assistance    Housing/Utilities N/A    Interpersonal Safety N/A    Intervention Case Management    ED Visit Averted N/A    Life-Saving Intervention Made N/A    Visit Setting Phone/Text/Email            Received text from client that he needs assistance with scheduling [employer]ast for ride from dental appointment. CN called transportation services and returned text to client that he would receive a text message when the ride was available. Shann Medal RN BSN CNP [phone]

## 2022-01-11 ENCOUNTER — Ambulatory Visit
Admission: RE | Admit: 2022-01-11 | Discharge: 2022-01-11 | Disposition: A | Discharge status: Home / Self Care | Payer: No Typology Code available for payment source | Source: Ambulatory Visit | Attending: Family Medicine | Admitting: Family Medicine

## 2022-01-11 ENCOUNTER — Encounter (INDEPENDENT_AMBULATORY_CARE_PROVIDER_SITE_OTHER): Payer: Self-pay | Admitting: Family Medicine

## 2022-01-11 ENCOUNTER — Ambulatory Visit (INDEPENDENT_AMBULATORY_CARE_PROVIDER_SITE_OTHER): Payer: No Typology Code available for payment source | Admitting: Family Medicine

## 2022-01-11 VITALS — BP 124/76 | HR 76 | Temp 98.2°F | Resp 16 | Wt 168.0 lb

## 2022-01-11 DIAGNOSIS — Z13220 Encounter for screening for lipoid disorders: Secondary | ICD-10-CM

## 2022-01-11 DIAGNOSIS — Z131 Encounter for screening for diabetes mellitus: Secondary | ICD-10-CM

## 2022-01-11 DIAGNOSIS — Z974 Presence of external hearing-aid: Secondary | ICD-10-CM | POA: Insufficient documentation

## 2022-01-11 DIAGNOSIS — M25561 Pain in right knee: Secondary | ICD-10-CM | POA: Insufficient documentation

## 2022-01-11 DIAGNOSIS — G8929 Other chronic pain: Secondary | ICD-10-CM | POA: Insufficient documentation

## 2022-01-11 DIAGNOSIS — K625 Hemorrhage of anus and rectum: Secondary | ICD-10-CM

## 2022-01-11 DIAGNOSIS — M25562 Pain in left knee: Secondary | ICD-10-CM | POA: Insufficient documentation

## 2022-01-11 LAB — COMPREHENSIVE METABOLIC PANEL
ALT: 17 U/L (ref 0–55)
AST (SGOT): 21 U/L (ref 5–41)
Albumin/Globulin Ratio: 1.2 (ref 0.9–2.2)
Albumin: 4.1 g/dL (ref 3.5–5.0)
Alkaline Phosphatase: 87 U/L (ref 37–117)
Anion Gap: 4 — ABNORMAL LOW (ref 5.0–15.0)
BUN: 12 mg/dL (ref 9.0–28.0)
Bilirubin, Total: 0.3 mg/dL (ref 0.2–1.2)
CO2: 30 mEq/L — ABNORMAL HIGH (ref 17–29)
Calcium: 9.7 mg/dL (ref 8.5–10.5)
Chloride: 105 mEq/L (ref 99–111)
Creatinine: 0.8 mg/dL (ref 0.5–1.5)
Globulin: 3.3 g/dL (ref 2.0–3.6)
Glucose: 93 mg/dL (ref 70–100)
Potassium: 4.6 mEq/L (ref 3.5–5.3)
Protein, Total: 7.4 g/dL (ref 6.0–8.3)
Sodium: 139 mEq/L (ref 135–145)

## 2022-01-11 LAB — CBC AND DIFFERENTIAL
Absolute NRBC: 0 10*3/uL (ref 0.00–0.00)
Basophils Absolute Automated: 0.03 10*3/uL (ref 0.00–0.08)
Basophils Automated: 0.5 %
Eosinophils Absolute Automated: 0.42 10*3/uL (ref 0.00–0.44)
Eosinophils Automated: 6.3 %
Hematocrit: 44.7 % (ref 37.6–49.6)
Hgb: 14.4 g/dL (ref 12.5–17.1)
Immature Granulocytes Absolute: 0.01 10*3/uL (ref 0.00–0.07)
Immature Granulocytes: 0.2 %
Instrument Absolute Neutrophil Count: 3.58 10*3/uL (ref 1.10–6.33)
Lymphocytes Absolute Automated: 2.1 10*3/uL (ref 0.42–3.22)
Lymphocytes Automated: 31.6 %
MCH: 29 pg (ref 25.1–33.5)
MCHC: 32.2 g/dL (ref 31.5–35.8)
MCV: 89.9 fL (ref 78.0–96.0)
MPV: 11.1 fL (ref 8.9–12.5)
Monocytes Absolute Automated: 0.51 10*3/uL (ref 0.21–0.85)
Monocytes: 7.7 %
Neutrophils Absolute: 3.58 10*3/uL (ref 1.10–6.33)
Neutrophils: 53.7 %
Nucleated RBC: 0 /100 WBC (ref 0.0–0.0)
Platelets: 304 10*3/uL (ref 142–346)
RBC: 4.97 10*6/uL (ref 4.20–5.90)
RDW: 13 % (ref 11–15)
WBC: 6.65 10*3/uL (ref 3.10–9.50)

## 2022-01-11 LAB — HEMOLYSIS INDEX: Hemolysis Index: 4 Index (ref 0–24)

## 2022-01-11 LAB — GFR: EGFR: >60

## 2022-01-11 LAB — HEMOGLOBIN A1C
Average Estimated Glucose: 99.7 mg/dL
Hemoglobin A1C: 5.1 % (ref 4.6–5.6)

## 2022-01-11 LAB — LIPID PANEL
Cholesterol / HDL Ratio: 3.8 Index
Cholesterol: 173 mg/dL (ref 0–199)
HDL: 45 mg/dL (ref 40–9999)
LDL Calculated: 109 mg/dL — ABNORMAL HIGH (ref 0–99)
Triglycerides: 95 mg/dL (ref 34–149)
VLDL Calculated: 19 mg/dL (ref 10–40)

## 2022-01-11 NOTE — Progress Notes (Signed)
[facility]                       Date of Exam: 01/11/2022 2:46 PM        Patient ID: [name] [name] is a 66 y.o. male.  Attending Physician: Oswaldo Done, MD        Chief Complaint:    Chief Complaint   Patient presents with    Establish Care    Leg Pain    Rectal Bleeding         Assessment / Plan :    1. Bilateral chronic knee pain  Chronic, possibly has arthritis, will get xrays checked, consider use of nsaids prn. Will check CMP first  - XR Knee Left 4+ Views; Future  - Comprehensive metabolic panel    2. Blood per rectum  Chronic, possibly due to hemorrhoids, will check CBC, overdue for colonoscopy, will send referral  - Referral to Gastroenterology ([place]); Future  - CBC and differential    3. Screening for lipid disorders  Will get screening done.  - Lipid panel    4. Screening for diabetes mellitus  Will get screening done.  - Hemoglobin A1C    5. Uses hearing aid  History of hearing loss.                 HPI:    HPI  Patient is new to the practice here to establish care, speaks Dari/Farsi, we have video translator.     Used to live in [place]. Moved to [place] last August 2021. He did not have a usual provider would travel to [place] to get health care as needed. Reports that he generally is healthy.     Bilateral knee pain  Left is worse than the right. Notices this more when the weather is cold. Has been going on for 3-4 years. Has been using clothing to keep it warm. No pain currently. Tries to walk for exercise.     Rectal bleeding  Has had some blood in stool when he has a bowel movement. Has been going on for 2 years now. No black stool. Bleeding is when he wipes.       Wants to get cholesterol and blood work checked. No prior history of diabetes or hypertension. Not taking medications.     He has hearing loss and is using hearing aids.                  Problem List:    Patient Active Problem List   Diagnosis    Uses hearing aid    Bilateral chronic knee pain              Current Meds:    No outpatient medications have been marked as taking for the 01/11/22 encounter (Office Visit) with Kensley Lares, Emogene Morgan, MD.          Allergies:    No Known Allergies          Past Surgical History:    Past Surgical History:   Procedure Laterality Date    NOSE SURGERY             Family History:    History reviewed. No pertinent family history.        Social History:    Social History     Tobacco Use    Smoking status: Never    Smokeless tobacco: Never   Vaping Use  Vaping status: Never Used   Substance Use Topics    Alcohol use: Not Currently    Drug use: Never           The following sections were reviewed this encounter by the provider:   Tobacco  Allergies  Meds  Problems  Med Hx  Surg Hx  Fam Hx                The following sections were reviewed this encounter by the provider:   Tobacco  Allergies  Meds  Problems  Med Hx  Surg Hx  Fam Hx             ROS:    Review of Systems   Constitutional:  Negative for chills and fever.   Gastrointestinal:  Negative for nausea and vomiting.                Vital Signs:    BP 124/76   Pulse 76   Temp 98.2 F (36.8 C) (Temporal)   Resp 16   Wt 76.2 kg (168 lb)   SpO2 98%          Physical Exam:    Physical Exam  Constitutional:       Appearance: Normal appearance.   Cardiovascular:      Rate and Rhythm: Normal rate and regular rhythm.   Pulmonary:      Effort: Pulmonary effort is normal. No respiratory distress.      Breath sounds: Normal breath sounds.   Abdominal:      General: Abdomen is flat. There is no distension.      Palpations: Abdomen is soft.      Tenderness: There is no abdominal tenderness.   Neurological:      General: No focal deficit present.      Mental Status: He is alert and oriented to person, place, and time.                        Follow-up:    Return in about 6 weeks (around 02/22/2022) for knee pain and lab review 30 minutes.         [address] R Cheronda Erck III, MD

## 2022-02-02 ENCOUNTER — Ambulatory Visit (INDEPENDENT_AMBULATORY_CARE_PROVIDER_SITE_OTHER): Payer: No Typology Code available for payment source | Admitting: Gastroenterology

## 2022-02-02 NOTE — Progress Notes (Deleted)
Jule Economy MD, MS.  [address] [place], [place] [address]  Appointments: [phone]    Reason for Consultation:   Rectal bleeding    Assessment and Plan:   Rectal bleeding was discussed in detail with the patient. Potential etiologies include anorectal pathology (such as hemorrhoids, anal fissure, etc), diverticular bleed, large/advanced polyps, colorectal mass, vascular lesion, colitis/proctitis. Further evaluation with colonoscopy is recommended, and additional recommendations may be made based on the results.      The colonoscopy procedure was explained to the patient, including indication, alternatives, possible benefits, and potential risks (including but not limited to bleeding, infection, perforation, aspiration, splenic avulsion, anesthesia complications such as slowed breathing and lowered blood pressure). The patient appeared to understand and agrees to proceed.      History:   [name] [name] is a 66 y.o. male who presents to the office       denies any fevers, chills, recent weight change, nausea, vomiting, heartburn, dysphagia, odynophagia, change in appetite, diarrhea, constipation, hematochezia, melena or jaundice.       minutes was spent on this encounter.  This includes the time spent reviewing pathology and imaging reports prior to the encounter as well as time spent discussing the care with other physicians and also face to face time with the patient.       Past Medical History:     Past Medical History:   Diagnosis Date    Seasonal allergic rhinitis        Past Surgical History:     Past Surgical History:   Procedure Laterality Date    NOSE SURGERY         Family History:   No family history on file.    Social History:     Social History     Socioeconomic History    Marital status: Married   Occupational History    Occupation: retired     Comment: Engineer, building services   Tobacco Use    Smoking status: Never    Smokeless tobacco: Never   Vaping Use    Vaping status: Never Used    Substance and Sexual Activity    Alcohol use: Not Currently    Drug use: Never   Social History Narrative    Lives with wife, daughter in law and son.     And 2 sons 2 daughter         Allergies:   No Known Allergies    Medications:   No current outpatient medications on file.    Review of Systems:   Constitutional:No fever,chills,weight loss or gain.  Integument:No skin rashes or lesions.  Eyes:No changes in vision  ENMT:No changes in hearing,nasal discharge,sore throat,oral lesions.  Respiratory:No shortness of breath or cough.  Cardiovascular:No chest pain or palpitations.  Genitourinary:No nocturia,urinary frequency, or dysuria.  Gastrointestinal:See HPI.  Musculoskeletal:No back or joint pain.  Neurologic:No migraine headaches,numbness, or tingling.  Endocrine:No polydipsia,cold or heat intolerance.  Hematologic:No bleeding or bruising.  Psychiatric:No depression or anxiety.     Physical Exam:   There were no vitals filed for this visit.     General appearance - Well developed and well nourished. Normal gait.  HEENT- Normocephalic. Eomi. Sclera anicteric. Normal hearing. Nares normal.  Oropharynx normal.  Neck - Supple.  No thyromegaly.  No cervical lymphadenopathy.  Chest - clear to percussion and auscultation.  No wheeze, rhonchi, or rales.  Heart - regular rate and rhythm without murmurs, gallops, or rubs.  Abdomen - normal bowel sounds.  No focal tenderness to palpation. No hepatosplenomegaly.  No masses. No ascites.  Rectal - deferred until time of colonoscopy.  Musculoskeletal - normal range of motion of arms and legs.  Extremities - no clubbing, cyanosis, or edema.  Skin - no rashes or lesions.  Neurologic - Alert and oriented to person, place and time.  No focal motor or sensory deficits.  No asterixis.  Recent and remote memory normal.

## 2022-02-22 ENCOUNTER — Encounter (INDEPENDENT_AMBULATORY_CARE_PROVIDER_SITE_OTHER): Payer: Self-pay

## 2022-02-22 ENCOUNTER — Ambulatory Visit (INDEPENDENT_AMBULATORY_CARE_PROVIDER_SITE_OTHER): Payer: No Typology Code available for payment source | Admitting: Family Medicine

## 2022-03-11 ENCOUNTER — Ambulatory Visit (INDEPENDENT_AMBULATORY_CARE_PROVIDER_SITE_OTHER): Payer: No Typology Code available for payment source | Admitting: Gastroenterology

## 2022-03-11 ENCOUNTER — Encounter (INDEPENDENT_AMBULATORY_CARE_PROVIDER_SITE_OTHER): Payer: Self-pay | Admitting: Gastroenterology

## 2022-03-11 ENCOUNTER — Telehealth: Payer: Self-pay | Admitting: Gastroenterology

## 2022-03-11 DIAGNOSIS — K625 Hemorrhage of anus and rectum: Secondary | ICD-10-CM

## 2022-03-11 MED ORDER — GOLYTELY 236 G PO SOLR
8.0000 L | Freq: Once | ORAL | 0 refills | Status: AC
Start: 2022-03-11 — End: 2022-03-11

## 2022-03-11 NOTE — Patient Instructions (Addendum)
Please schedule a colonoscopy    To schedule or reschedule a procedure, please call my procedure coordinator Robyn Free at [phone].     3 Day Colonoscopy Preparation Instructions - COLYTE, GAVILYTE, GOLYTELY, OR NULYTELY  Please carefully read 10 days before your procedure.    IF YOU ARE ON BLOOD THINNERS (COUMADIN, PLAVIX, etc.), INSULIN OR OTHER DIABETIC MEDICATIONS, PLEASE LET US KNOW AND CHECK WITH YOUR PRESCRIBING PHYSICIAN FOR INSTRUCTIONS. Your prescribing provider needs to determine if you should stop or stay on your blood thinner before procedure.  Not following these instructions may result in cancellation.      General Endoscopy Information  Do no chew gum or suck on hard candy the day of your procedure.  You must have a responsible adult to accompany you home after the procedure. This person must pick you up in the endoscopy unit. If you do not have a responsible adult to accompany you home, your procedure will be cancelled.   You may not operate a motor vehicle for the remainder of the day following your procedure.   You may not take a taxi, Uber or bus home unless accompanied by a responsible adult.   If your insurance company requires a REFERRAL, YOU MUST BRING IT WITH YOU. Also, please bring your current insurance card(s), copay (if applicable), and a current picture ID with you on the day of your procedure.   Our office will contact your insurance carrier to verify coverage and, if required, obtain preauthorization for your procedure. However, preauthorization is not a guarantee of payment and you will be responsible for any deductibles, copays, co-insurance, and/or any other plan specific out-of-pocket expenses.   Dependent upon your family history, personal history, prior gastroenterology diagnoses, or findings discovered during your colonoscopy, your procedure may be considered preventative or diagnostic. This determination will not be made until after the procedure has concluded and will be  based upon the findings of your exam. In our experience, many insurance carriers cover preventative and diagnostic colonoscopies differently, and as a result, your out-of-pocket payment may also differ. If you have any questions about your coverage, please contact your insurance carrier directly.   If you have any questions, please contact your GI physician's office during normal business hours.    Preparation Instructions    Nine (9) days before the procedure date:  Daily MiraLAX - you will need to purchase a bottle of MiraLAX from any pharmacy. This is over the counter and does not require a prescription.  Please take one scoop (mixed in an 8 oz. glass of water) once daily for 7 days prior to your 3-day prep.  Avoid any salads or high fiber foods including wheat bread, nuts, seeds, popcorn, and fiber supplements.  Fill your prescriptions for your bowl prep. You should have two Colyte prescriptions.    Two (2) days before the procedure: Full Dose     Drink only clear liquids the entire day. No solid food should be taken. Clear liquids include: water, broth without any solid pieces, apple juice, white grape juice, pulp free lemonade, sprite, ginger-ale, coffee or tea without milk or nondairy creamers, plain l-) (no added fruit or toppings, no red, purple, or blue Jell-O). See clear diet below.  5 p.m. - Gavilyte, Golytely, Nulytely or Colyte Prep: mix the powder with water in the provided plastic container to the 'fill" line and chill in the refrigerator. Begin drinking the solution at approximately 5 p.m. Drink one 8-ounce glass every 10 - 15   minutes until the solution is finished, which takes approximately 4 hours. You must drink the entire solution.   You may add "Crystal Light" powdered lemonade (as an alternative to the flavor packets) to the solution to improve its taste.  No solid food should be taken during or after the prep. Please continue to drink clear liquids until you go to sleep.     * It is not  uncommon for individuals to experience bloating or nausea when drinking a solution. If have vomiting or other concerning symptoms, please call your GI physician's office.    One (1) day before the procedure: Split Dose: 1st Half    Drink only clear liquids the entire day. No solid food should be taken. Clear liquids include: water, broth without any solid pieces, apple juice, white grape juice, pulp free lemonade, sprite, ginger-ale, coffee or tea without milk or nondairy creamers, plain Jell-O (no added fruit or toppings, no red, purple, or blue Jell-O). See clear diet below.  Prepare the solution: Gavilyte, Golytely, Nulytely or Colyte Prep: mixt the powder with water in the provided plastic container to the 'fill" line and chill in the refrigerator. Begin drinking the solution at approximately 5 p.m. Drink one 8-ounce glass every 10 - 15 minutes until the solution is finished, which takes approximately 4 hours. You must drink the entire solution.   You may add "Crystal Light" powdered lemonade (as an alternative to the flavor packets) to the solution to improve its taste.  No solid food should be taken during or after the prep.   At 5 p.m.: Begin 1st dose of the prep solution at a rate of 8 ounces every 15-30 minutes (over 1-2 hours) Until half of the prep solution is finished. If you feel full or nauseated by drinking the solution, then slow down and finish the first half of the solution before midnight.  Please continue to drink clear liquids until you go to bed.    The day of your procedure: Split dose - 2nd half    6 hours before your procedure time: Start 2nd dose of prep solution. Depending on your procedure start time this may require waking up early to complete the prep.  Drink the solution at a rate of 8 ounces every 15-30 minutes (over 1-2 hours) until you finish the entire solution.  It is important that you finish the remaining 2 liters of the prep solution at least 4 hours before your scheduled  procedure.  Complete 2nd dose 4 hours prior to your scheduled start time. Do not take anything by mouth starting 4 hours before your procedure.  Do not eat hard candy or chew gum.   Wear comfortable clothing that is easy to remove and leave jewelry at home. Leave any valuables (i.e., purse, cell phone etc.) with family or companion.  Report to the Endoscopy Procedure Unit 90 minutes before your scheduled procedure time.   Once in the pre-procedural area, you will be asked to put on a hospital gown. A nurse will review your medical history with you (Bring a list of your current medication, allergies, and a copy of your recent EKG). An intravenous line (IV) will be started for your sedation during the procedure.   When your procedure is done, you may remain in the recovery room for up to 1 hour.   Your doctor will discuss the results of your procedure with you and give you a copy of your report.    CLEAR LIQUID DIET     THESE ITEMS ARE ALLOWED:  Water  Clear broth: beef, chicken, vegetable  Juices  Apple juice or cider  White grape juice, white cranberry juice  Tang  Lemonade  Soda (clear color)  Tea  Coffee (without cream)  Clear gelatin (without fruit)  Popsicles (without fruit or cream)  Italian ices THESE ITEMS ARE NOT ALLOWED:  Milk  Cream  Milkshakes  Tomato juice  Orange juice  Cream soups  Any soup other than the listed broth  Oatmeal  Cream of Wheat  Grapefruit juice  Alcohol     PLEASE NOTE: It is extremely important to follow the preparation listed above, so that the doctor will be able to see your entire colon. Your colon must be clear of any stool. Inadequate preparation limits the value of this procedure and could necessitate rescheduling of the examination.     * If you need to reschedule or cancel your appointment, please call you GI physician's office.        FREQUENTLY ASKED QUESTIONS:      My procedure is in the afternoon. Can I eat in the morning?   A: No. To ensure your safety during the procedure,  it is important that the stomach is empty. Any food or liquid in the stomach at the time of the procedure places you at risk of aspirating these contents into the lung leading to a serious complication called aspiration pneumonia. You can drink water until 4 hours before your procedure time.     I ate breakfast (lunch or dinner) the day before my colonoscopy. Is that okay?    A: If the preparation instructions were not followed properly, residual stool may remain in the colon and hide important findings from the examining physician. In some cases, if the colon preparation is not good, you may have to repeat the preparation and the exam. If you accidentally eat any solid food the day before your exam, please call and ask to speak with a member of the nursing staff. You may be asked to reschedule your procedure.     I don't have a ride. Is that okay?   A: No. If you do not have a responsible adult to accompany you home, YOUR PROCEDURE WILL BE CANCELLED.    How many days prior to the procedure should I discontinue my Coumadin, Plavix or other blood thinning medications?   A: If you are taking any blood thinning medications please let your endoscopist know. Generally speaking, you should quit taking your blood thinning medication 2-7 days prior to some procedures depending on the medication; however, you must check with your endoscopist and your prescribing physician to ensure that it is needed and safe for you to do so.    What medications can I take the day before and the day of my procedure?  A: The day prior to your procedure take your medications the way you normally would. However, for those patients taking any type of bowel cleansing preparation, be advised that you may undergo a prolonged period of diarrhea that may flush oral medications out of your system before they have time to take effect. The morning of your procedure you should take any blood pressure or heart medications you may be on with a small sip of  water. You can hold most other medications and take them once your procedure has been completed. If you have questions about specific medication(s), please call a member of the endoscopy unit clinical staff.    I am diabetic.   Do I take my insulin?   A: You must direct the question to the physician who placed you on this medication. Please check your blood sugar the morning of your procedure as you normally would. If you have any questions about your diabetes management in conjunction with your fast for your endoscopic procedure, please consult your primary physician.     I am on pain medication? Can I take it prior to my procedure?   A: You may take your prescription pain medications prior to your procedure with a small sip of water. Please inform the pre-procedural clinical staff of any medications you've taken the day of your procedure. If you have any questions, please call a member of the endoscopy unit clinical staff.    I'm having a menstrual period. Should I reschedule my colonoscopy appointment?   A: No. Your menstrual period will not interfere with your physician's ability to complete your procedure.     May I continue taking my iron tablets?   A: No. Iron may cause the formation of dark color stools which can make it difficult for the physician to complete your colonoscopy if your preparation is less than optimal. We recommend you stop taking your oral iron supplements at least one week prior to your procedure.     I have been on Aspirin therapy for my heart. Should I continue to take it?   A: Aspirin may affect blood coagulation. Please check with your endoscopist and prescribing physician.     I am having a colonoscopy tomorrow. I started my colon preparation on time but now I am experiencing diarrhea and/or a bloating feeling. What should I do?   A: Nausea, vomiting and a sense of fullness or bloating can occur any time after beginning your colon preparation. However, it is important that you drink all  the preparation. For most people, taking an hour break from the preparation will usually help. Then continue taking the preparation as ordered. If the vomiting returns or symptoms get worse, please call your GI physician's office.    If you have any questions or concerns, please don't hesitate to call.     Sincerely,    [facility]

## 2022-03-11 NOTE — Progress Notes (Signed)
[name] Economy MD, MS.  [address] [place], [place] [address]  Appointments: [phone]    Interpreter : Candice Camp [phone]    Reason for Consultation:   Rectal bleeding    Assessment and Plan:   Rectal bleeding was discussed in detail with the patient. Potential etiologies include anorectal pathology (such as hemorrhoids, anal fissure, etc), diverticular bleed, large/advanced polyps, colorectal mass, vascular lesion, colitis/proctitis. Further evaluation with colonoscopy is recommended, and additional recommendations may be made based on the results.      The colonoscopy procedure was explained to the patient, including indication, alternatives, possible benefits, and potential risks (including but not limited to bleeding, infection, perforation, aspiration, splenic avulsion, anesthesia complications such as slowed breathing and lowered blood pressure).     Patient did not want to proceed with a colonoscopy.  He will follow-up with a primary care physician.      History:   [name] [name] is a 65 y.o. male who presents to the office    He is a consult for rectal bleeding.  Patient reports rectal bleeding has been going on for about 2 years and currently he has not had any bloody stools in 2 to 3 months.  Most of his blood is seen on toilet tissue when wiping.  He denies rectal pain.  Hemoglobin is 14    Patient denies any fevers, chills, recent weight change, nausea, vomiting, heartburn, dysphagia, odynophagia, change in appetite, diarrhea, constipation, melena or jaundice.        Past Medical History:     Past Medical History:   Diagnosis Date    Seasonal allergic rhinitis        Past Surgical History:     Past Surgical History:   Procedure Laterality Date    NOSE SURGERY         Family History:   No family history on file.    Social History:     Social History     Socioeconomic History    Marital status: Married   Occupational History    Occupation: retired     Comment: Engineer, building services    Tobacco Use    Smoking status: Never    Smokeless tobacco: Never   Haematologist Use: Never used   Substance and Sexual Activity    Alcohol use: Not Currently    Drug use: Never   Social History Narrative    Lives with wife, daughter in law and son.     And 2 sons 2 daughter         Allergies:   No Known Allergies    Medications:   No current outpatient medications on file.    Review of Systems:   Constitutional:No fever,chills,weight loss or gain.  Integument:No skin rashes or lesions.  Eyes:No changes in vision  ENMT:No changes in hearing,nasal discharge,sore throat,oral lesions.  Respiratory:No shortness of breath or cough.  Cardiovascular:No chest pain or palpitations.  Genitourinary:No nocturia,urinary frequency, or dysuria.  Gastrointestinal:See HPI.  Musculoskeletal:No back or joint pain.  Neurologic:No migraine headaches,numbness, or tingling.  Endocrine:No polydipsia,cold or heat intolerance.  Hematologic:No bleeding or bruising.  Psychiatric:No depression or anxiety.     Physical Exam:   There were no vitals filed for this visit.     General appearance - Well developed and well nourished. Normal gait.  HEENT- Normocephalic. Eomi. Sclera anicteric. Normal hearing. Nares normal.  Oropharynx normal.  Neck - Supple.  No thyromegaly.  No cervical lymphadenopathy.  Chest - clear  to percussion and auscultation.  No wheeze, rhonchi, or rales.  Heart - regular rate and rhythm without murmurs, gallops, or rubs.  Abdomen - normal bowel sounds.  No focal tenderness to palpation. No hepatosplenomegaly.  No masses. No ascites.  Rectal - deferred until time of colonoscopy.  Musculoskeletal - normal range of motion of arms and legs.  Extremities - no clubbing, cyanosis, or edema.  Skin - no rashes or lesions.  Neurologic - Alert and oriented to person, place and time.  No focal motor or sensory deficits.  No asterixis.  Recent and remote memory normal.     68 minutes was spent on this encounter.  This includes  the time spent reviewing pathology and imaging reports prior to the encounter as well as time spent discussing the care with other physicians and also face to face time with the patient.

## 2022-03-12 ENCOUNTER — Ambulatory Visit (INDEPENDENT_AMBULATORY_CARE_PROVIDER_SITE_OTHER): Payer: No Typology Code available for payment source | Admitting: Physician Assistant

## 2022-03-15 NOTE — Telephone Encounter (Signed)
Called pharmacy- gave instructions for 3 day prep- pharmacy will get the medication ready for pt.

## 2022-04-19 ENCOUNTER — Emergency Department
Admission: EM | Admit: 2022-04-19 | Discharge: 2022-04-19 | Disposition: A | Discharge status: Home / Self Care | Payer: No Typology Code available for payment source | Attending: Emergency Medicine | Admitting: Emergency Medicine

## 2022-04-19 DIAGNOSIS — W01198A Fall on same level from slipping, tripping and stumbling with subsequent striking against other object, initial encounter: Secondary | ICD-10-CM | POA: Insufficient documentation

## 2022-04-19 DIAGNOSIS — R040 Epistaxis: Secondary | ICD-10-CM | POA: Insufficient documentation

## 2022-04-19 DIAGNOSIS — Y93A1 Activity, exercise machines primarily for cardiorespiratory conditioning: Secondary | ICD-10-CM | POA: Insufficient documentation

## 2022-04-19 MED ORDER — OXYMETAZOLINE HCL 0.05 % NA SOLN
2.0000 | Freq: Once | NASAL | Status: DC
Start: 2022-04-19 — End: 2022-04-19
  Administered 2022-04-19: 2 via NASAL
  Filled 2022-04-19: qty 15

## 2022-04-19 NOTE — Discharge Instructions (Addendum)
If you have another nosebleed:  Blow your nose to clear any blood clots  Pinch your nares together very tightly and hold it for 10 minutes (no peeking!)  If your nose is still bleeding, spray 2 pumps of afrin up your nose, then put a cotton ball soaked with afrin into the bleeding side and hold pressure for another 10 minutes.  If this does not stop the bleeding, report to the nearest ER.

## 2022-04-19 NOTE — ED Provider Notes (Signed)
EMERGENCY DEPARTMENT NOTE     Patient initially seen and examined at   ED PHYSICIAN ASSIGNED       None           ED MIDLEVEL (APP) ASSIGNED       Date/Time Event User Comments    04/19/22 0143 PA/NP Provider Assigned Mirza Kidney L Cadee Agro L, PA assigned as Physician Assistant            HISTORY OF PRESENT ILLNESS   Translator Used : No Declined, preferred for son to interpret    Chief Complaint: Epistaxis (Due to fall)       66 y.o. male with past medical history as below presents for evaluation of nosebleed.  Patient was working out on the treadmill when he tripped and fell, hitting his nose.  No LOC, vision changes, vomiting, dizziness or headache. Denies anticoagulant use.    Independent Historian (other than patient): Family (list in HPI)  Additional History Provided by Independent Historian: Per son at nosebleed was initially very heavy but has greatly reduced to a slow dribble up until just prior to arrival.  MEDICAL HISTORY     Past Medical History:  Past Medical History:   Diagnosis Date    Seasonal allergic rhinitis        Past Surgical History:  Past Surgical History:   Procedure Laterality Date    NOSE SURGERY         Social History:  Social History     Socioeconomic History    Marital status: Married   Occupational History    Occupation: retired     Comment: Engineer, building services   Tobacco Use    Smoking status: Never    Smokeless tobacco: Never   Haematologist Use: Never used   Substance and Sexual Activity    Alcohol use: Not Currently    Drug use: Never   Social History Narrative    Lives with wife, daughter in law and son.     And 2 sons 2 daughter        Family History:  History reviewed. No pertinent family history.    Outpatient Medication:  There are no discharge medications for this patient.        REVIEW OF SYSTEMS   Review of Systems See History of Present Illness  PHYSICAL EXAM     ED Triage Vitals [04/19/22 0134]   Enc Vitals Group      BP 116/72      Heart Rate (!) 55       Resp Rate 19      Temp 97.8 F (36.6 C)      Temp Source Oral      SpO2 98 %      Weight 70 kg      Height 1.7 m      Head Circumference       Peak Flow       Pain Score 5      Pain Loc       Pain Edu?       Excl. in GC?      Physical Exam  Vitals and nursing note reviewed.   Constitutional:       General: He is not in acute distress.     Appearance: He is not ill-appearing or toxic-appearing.   HENT:      Head: Normocephalic and atraumatic. No contusion.      Right Ear: Tympanic membrane normal.  Left Ear: Tympanic membrane normal.      Nose: No nasal deformity, septal deviation or nasal tenderness.      Right Nostril: No foreign body or epistaxis.      Left Nostril: No epistaxis.      Comments: Clotted blood in right nare. No active bleeding from nares      Mouth/Throat:      Mouth: Mucous membranes are moist.      Pharynx: Oropharynx is clear. Uvula midline.   Eyes:      Extraocular Movements: Extraocular movements intact.      Pupils: Pupils are equal, round, and reactive to light.   Cardiovascular:      Rate and Rhythm: Normal rate.   Pulmonary:      Effort: Pulmonary effort is normal.   Abdominal:      Palpations: Abdomen is soft.      Tenderness: There is no abdominal tenderness.   Musculoskeletal:      Cervical back: Normal range of motion and neck supple.   Skin:     General: Skin is warm and dry.      Capillary Refill: Capillary refill takes less than 2 seconds.   Neurological:      General: No focal deficit present.      Mental Status: He is alert and oriented to person, place, and time.      Motor: No weakness.      Coordination: Coordination normal.      Gait: Gait normal.          MEDICAL DECISION MAKING     PRIMARY PROBLEM LIST      Acute illness/injury with risk to life or bodily function (based on differential diagnosis or evaluation) DIAGNOSIS:epistaxis  Chronic Illness Impacting Care of the above problem: Advanced age Increases complexity of evaluation and Increases the risk of poor wound  healing  ddx: anterior epistaxis, posterior epistaxis  DISCUSSION      66 y/o male with no significant pmh c/o epistaxis xseveral hours. VSS. No active bleeding on exam. No blood in posterior pharynx. No nasal bridge tenderness. Clot cleared and nares reexamined, no new bleeding. Reviewed epistaxis management with pt should nosebleed reoccur. Advised f/up with PCP. Return precautions discussed.     If patient is being hospitalized is severe sepsis or septic shock suspected?: N/A          External Records Reviewed?: Physician Office Records    Additional Notes                     Vital Signs: Reviewed the patient's vital signs.   Nursing Notes: Reviewed and utilized available nursing notes.  Medical Records Reviewed: Reviewed available past medical records.  Counseling: The emergency provider has spoken with the patient and discussed today's findings, in addition to providing specific details for the plan of care.  Questions are answered and there is agreement with the plan.      MIPS DOCUMENTATION              CARDIAC STUDIES    The following cardiac studies were independently interpreted by me the Emergency Medicine Provider.  For full cardiac study results please see chart.                                        EMERGENCY IMAGING STUDIES    The following imagine studies were independently  interpreted by me (emergency medicine provider):                       RADIOLOGY IMAGING STUDIES      No orders to display       EMERGENCY DEPT. MEDICATIONS      ED Medication Orders (From admission, onward)      Start Ordered     Status Ordering Provider    04/19/22 0144 04/19/22 0143    Once        Route: Each Nare  Ordered Dose: 2 spray       Discontinued Chidera Thivierge L            LABORATORY RESULTS    Ordered and independently interpreted AVAILABLE laboratory tests.   Results       ** No results found for the last 24 hours. **              CRITICAL CARE/PROCEDURES    Procedures    DIAGNOSIS      Diagnosis:  Final diagnoses:    Right-sided epistaxis       Disposition:  ED Disposition       ED Disposition   Discharge    Condition   --    Date/Time   Mon Apr 19, 2022  2:19 AM    Comment   [place] Hole discharge to home/self care.    Condition at disposition: Stable                 Prescriptions:  There are no discharge medications for this patient.          This note was generated by the Epic EMR system/ Dragon speech recognition and may contain inherent errors or omissions not intended by the user. Grammatical errors, random word insertions, deletions and pronoun errors  are occasional consequences of this technology due to software limitations. Not all errors are caught or corrected. If there are questions or concerns about the content of this note or information contained within the body of this dictation they should be addressed directly with the author for clarification.           Candy Sledge, [place]  04/19/22 2014

## 2022-06-15 IMAGING — CT CT HEAD W/O CM
3 series · 15 of 47 positions shown, 18 images · non-contrast
Comparison: None.

CLINICAL DATA: Headache.

EXAM:
CT HEAD WITHOUT CONTRAST
TECHNIQUE: Contiguous axial images were obtained from the base of the skull
through the vertex without intravenous contrast.

[Series 2: head wo · axial · 0.47mm/px · z∈[+1524,+1659]mm · 9 of 33 slices shown, 12 images]
[im 3/33  brain]
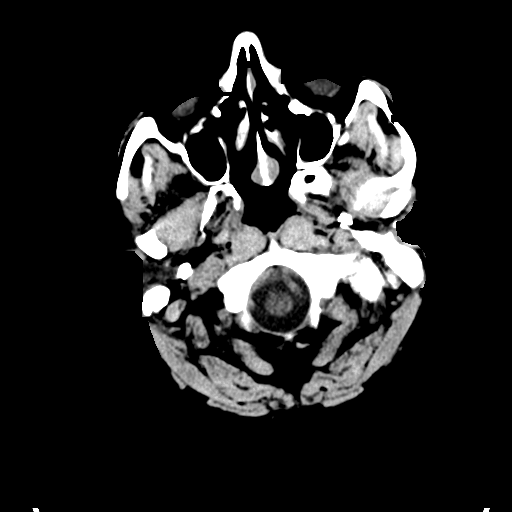
[im 3/33  bone]
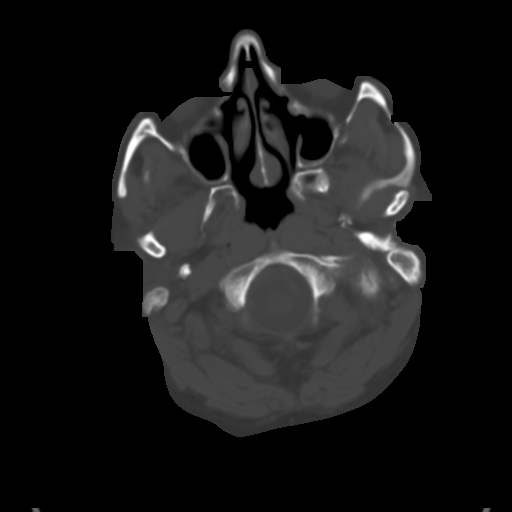
[im 6/33  brain]
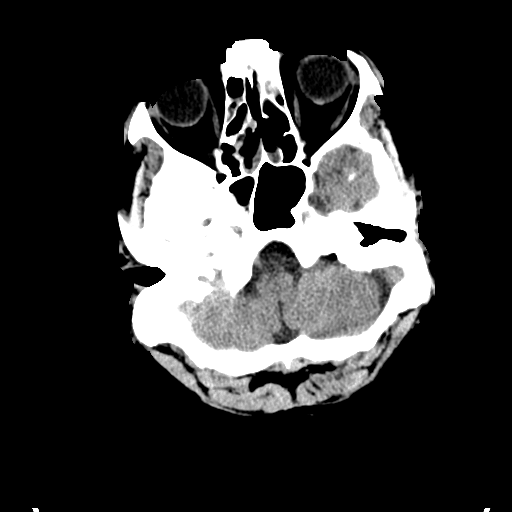
[im 9/33  brain]
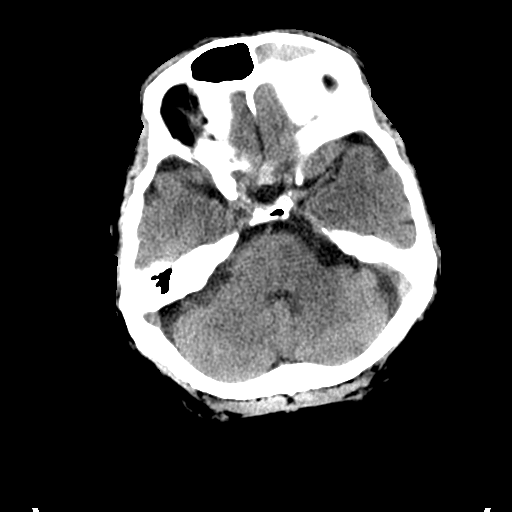
[im 13/33  brain]
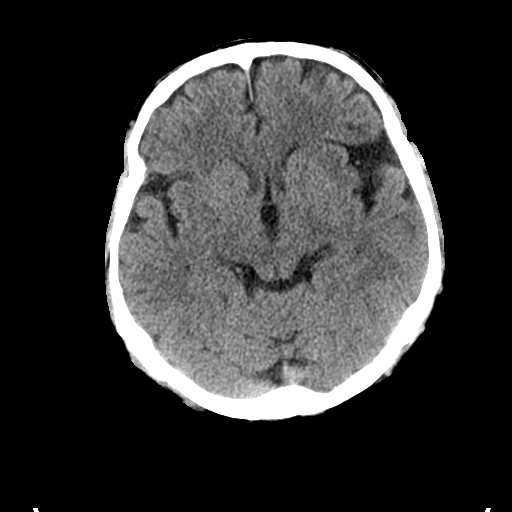
[im 17/33  brain]
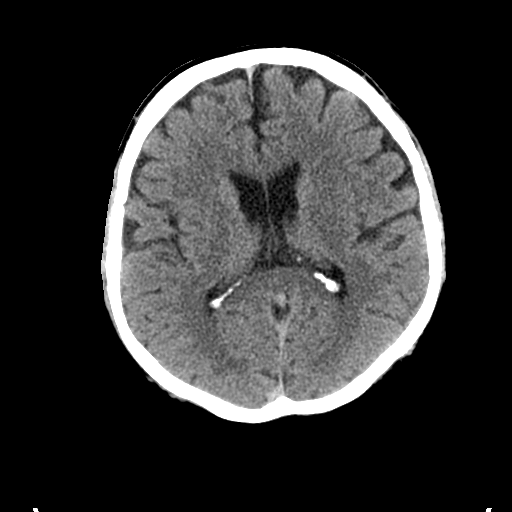
[im 17/33  bone]
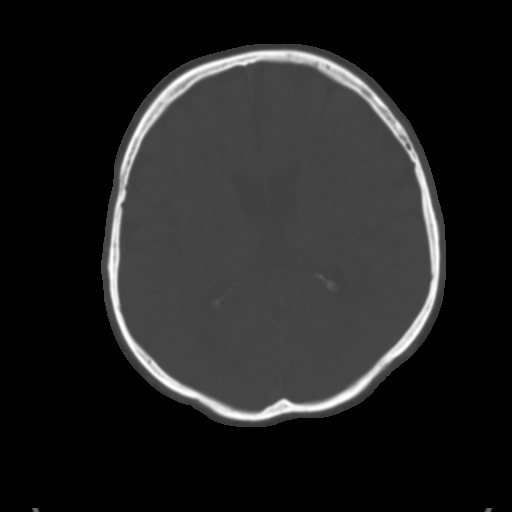
[im 20/33  brain]
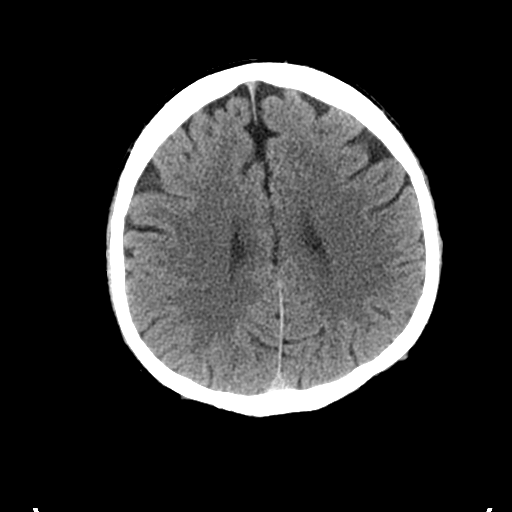
[im 24/33  brain]
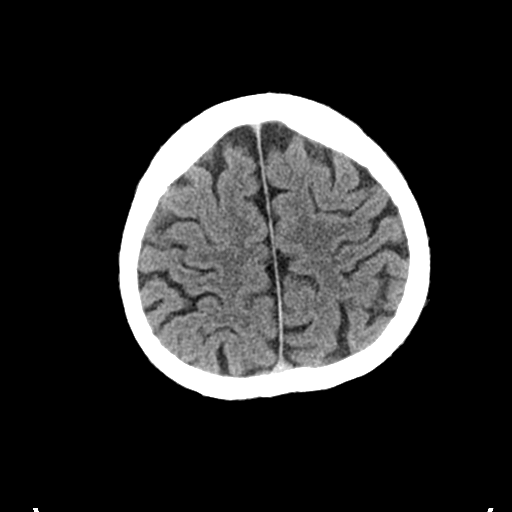
[im 27/33  brain]
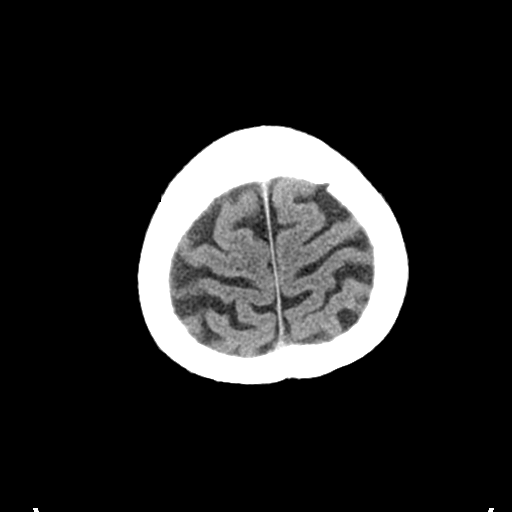
[im 30/33  brain]
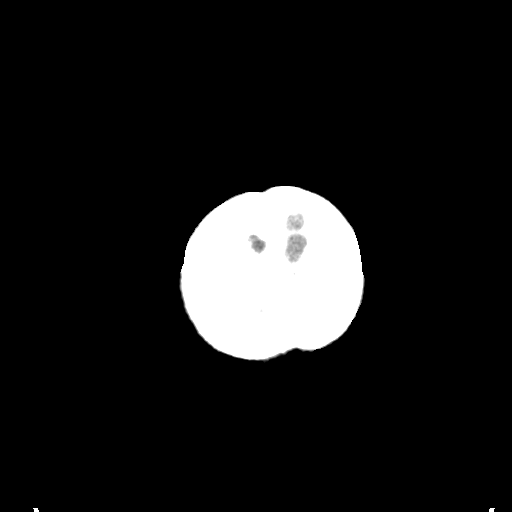
[im 30/33  bone]
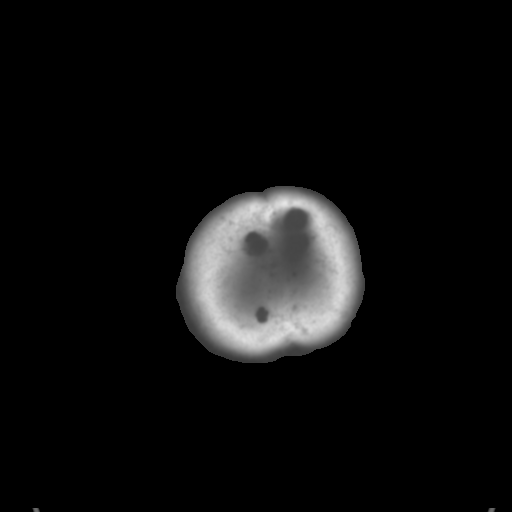

[Series 5: coronal soft tissue · coronal · 0.35mm/px · 3 of 75 slices shown]
[im 25/75  brain]
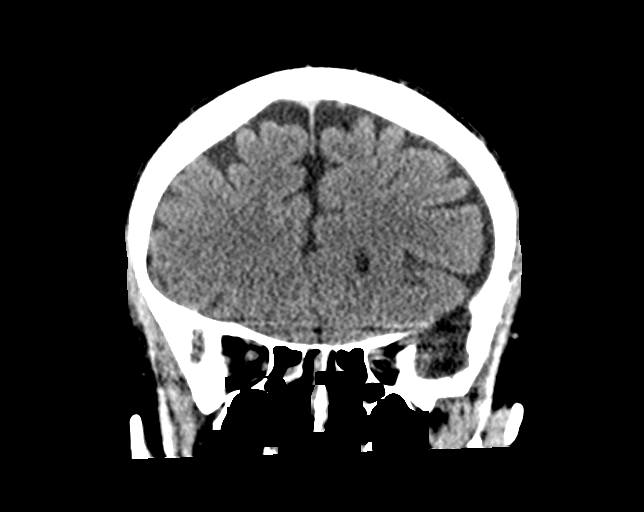
[im 33/75  brain]
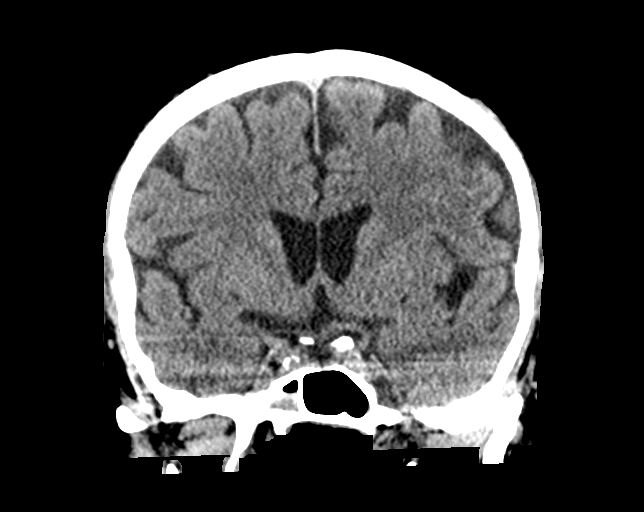
[im 42/75  brain]
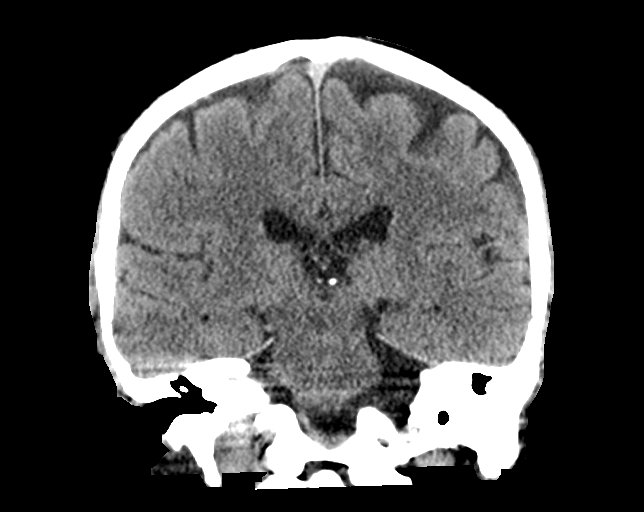

[Series 6: sagittal soft tissue · sagittal · 0.32mm/px · 3 of 69 slices shown]
[im 23/69  brain]
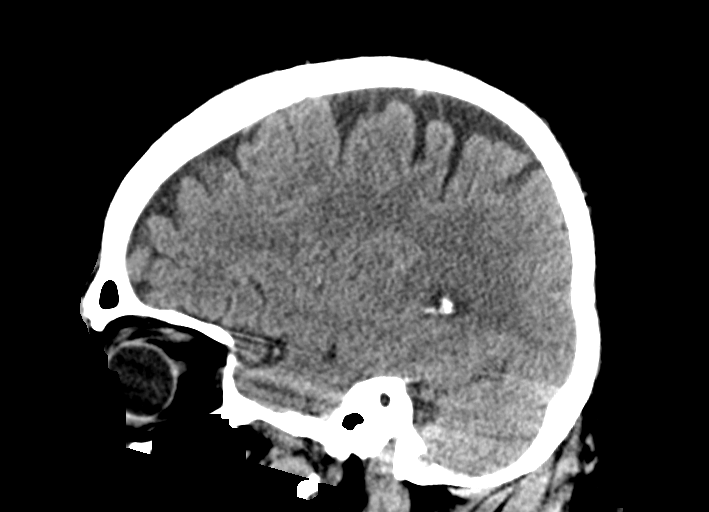
[im 35/69  brain]
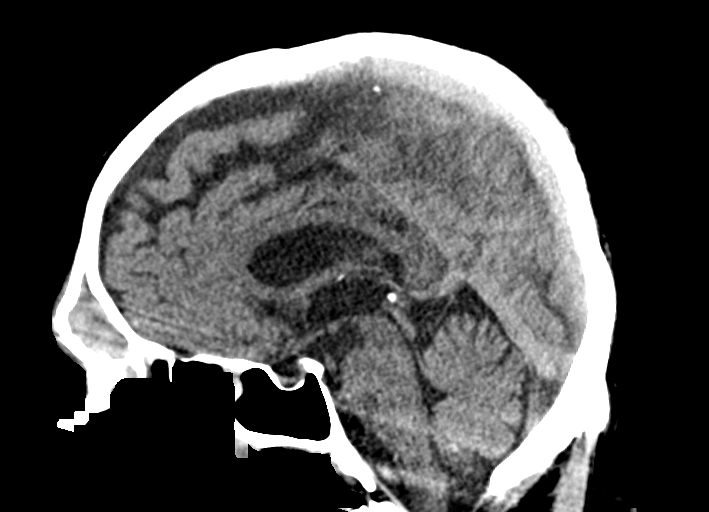
[im 46/69  brain]
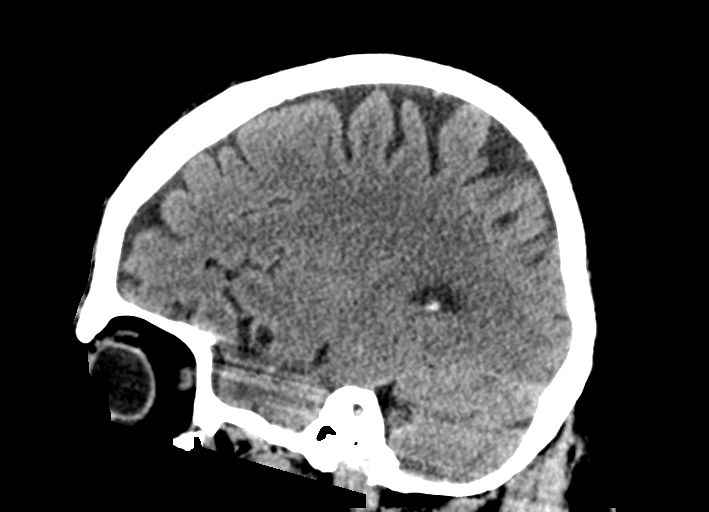

[15 of 47 positions shown; findings below may reference images not displayed]

FINDINGS: Brain: No evidence of acute infarction, hemorrhage, hydrocephalus,
extra-axial collection or mass lesion/mass effect.

Vascular: No hyperdense vessel or unexpected calcification.

Skull: Normal. Negative for fracture or focal lesion.

Sinuses/Orbits: Globes and orbits unremarkable.

Left frontal sinus is opacified. Moderate ethmoid mucosal
thickening. Mild maxillary and sphenoid mucosal thickening.

Other: None.
IMPRESSION: 1.  No intracranial abnormality.

2.  Sinus disease.

## 2022-08-06 ENCOUNTER — Ambulatory Visit (INDEPENDENT_AMBULATORY_CARE_PROVIDER_SITE_OTHER): Payer: No Typology Code available for payment source | Admitting: Family Medicine

## 2022-08-11 ENCOUNTER — Emergency Department
Admission: EM | Admit: 2022-08-11 | Discharge: 2022-08-11 | Disposition: A | Discharge status: Home / Self Care | Payer: No Typology Code available for payment source | Attending: Emergency Medicine | Admitting: Emergency Medicine

## 2022-08-11 ENCOUNTER — Emergency Department: Payer: No Typology Code available for payment source

## 2022-08-11 DIAGNOSIS — J210 Acute bronchiolitis due to respiratory syncytial virus: Secondary | ICD-10-CM | POA: Insufficient documentation

## 2022-08-11 DIAGNOSIS — Z1152 Encounter for screening for COVID-19: Secondary | ICD-10-CM | POA: Insufficient documentation

## 2022-08-11 DIAGNOSIS — Z20822 Contact with and (suspected) exposure to covid-19: Secondary | ICD-10-CM

## 2022-08-11 DIAGNOSIS — J9801 Acute bronchospasm: Secondary | ICD-10-CM | POA: Insufficient documentation

## 2022-08-11 LAB — COVID-19 (SARS-COV-2) & INFLUENZA A/B & RSV
Influenza A: NEGATIVE
Influenza B: NEGATIVE
Respiratory Syncytial Virus: POSITIVE — AB
SARS CoV 2 Overall Result: NEGATIVE

## 2022-08-11 MED ORDER — DEXAMETHASONE SODIUM PHOSPHATE 4 MG/ML IJ SOLN (WRAP) - FOR ORAL USE
8.0000 mg | Freq: Once | INTRAMUSCULAR | Status: AC
Start: 2022-08-11 — End: 2022-08-11
  Administered 2022-08-11: 8 mg via ORAL
  Filled 2022-08-11: qty 5

## 2022-08-11 MED ORDER — NEBULIZER MISC
0 refills | Status: DC
Start: 2022-08-11 — End: 2022-09-14

## 2022-08-11 MED ORDER — BENZONATATE 100 MG PO CAPS
100.0000 mg | ORAL_CAPSULE | Freq: Three times a day (TID) | ORAL | 0 refills | Status: DC | PRN
Start: 2022-08-11 — End: 2022-09-14

## 2022-08-11 MED ORDER — FLUTICASONE PROPIONATE 50 MCG/ACT NA SUSP
2.0000 | Freq: Every day | NASAL | 0 refills | Status: DC
Start: 2022-08-11 — End: 2022-09-14

## 2022-08-11 MED ORDER — BENZONATATE 100 MG PO CAPS
100.0000 mg | ORAL_CAPSULE | Freq: Once | ORAL | Status: AC
Start: 2022-08-11 — End: 2022-08-11
  Administered 2022-08-11: 100 mg via ORAL
  Filled 2022-08-11: qty 1

## 2022-08-11 MED ORDER — ALBUTEROL SULFATE (2.5 MG/3ML) 0.083% IN NEBU
2.5000 mg | INHALATION_SOLUTION | RESPIRATORY_TRACT | 0 refills | Status: DC | PRN
Start: 2022-08-11 — End: 2022-10-11

## 2022-08-11 MED ORDER — ALBUTEROL SULFATE HFA 108 (90 BASE) MCG/ACT IN AERS
2.0000 | INHALATION_SPRAY | Freq: Once | RESPIRATORY_TRACT | Status: AC
Start: 2022-08-11 — End: 2022-08-11
  Administered 2022-08-11: 2 via RESPIRATORY_TRACT
  Filled 2022-08-11: qty 8

## 2022-08-11 NOTE — ED Provider Notes (Signed)
EMERGENCY DEPARTMENT NOTE     Patient initially seen and examined at   ED PHYSICIAN ASSIGNED       Date/Time Event User Comments    08/11/22 1610 Physician Assigned Hilliard Clark, MD assigned as Attending           ED MIDLEVEL (APP) ASSIGNED       Date/Time Event User Comments    08/11/22 [phone] PA/NP Provider Assigned MISSANA, MAE L Missana, Mae Elbert Ewings, PA assigned as Physician Assistant            HISTORY OF PRESENT ILLNESS   Translator Used : Yes video interpreter ID number [id number]    Chief Complaint: URI       66 y.o. male who is otherwise healthy, presents to the ER with flu like symptoms. He states he has been taking cold and flu medication which has not been helped.     No nausea, no vomiting, no abdominal pain, no chest pains, no fevers, no chills.     Independent Historian (other than patient): Parent   Additional History Provided by Independent Historian:    Additional history is provided by wife. She notes it has been five days with severe cough with phlegm and he was having sweats and shortness of breath.  No fevers or chills. He has also had a runny nose. Grandchild who is 41 year old has flu like symptoms, they have been exposed to him recently.     MEDICAL HISTORY     Past Medical History:  Past Medical History:   Diagnosis Date    Seasonal allergic rhinitis        Past Surgical History:  Past Surgical History:   Procedure Laterality Date    NOSE SURGERY         Social History:  Social History     Socioeconomic History    Marital status: Married   Occupational History    Occupation: retired     Comment: Engineer, building services   Tobacco Use    Smoking status: Never    Smokeless tobacco: Never   Haematologist Use: Never used   Substance and Sexual Activity    Alcohol use: Not Currently    Drug use: Never   Social History Narrative    Lives with wife, daughter in law and son.     And 2 sons 2 daughter      Social Determinants of Health     Food Insecurity: No Food Insecurity  (08/11/2022)    Hunger Vital Sign     Worried About Running Out of Food in the Last Year: Never true     Ran Out of Food in the Last Year: Never true   Intimate Partner Violence: Not At Risk (08/11/2022)    Humiliation, Afraid, Rape, and Kick questionnaire     Fear of Current or Ex-Partner: No     Emotionally Abused: No     Physically Abused: No     Sexually Abused: No       Family History:  History reviewed. No pertinent family history.    Outpatient Medication:  Previous Medications    No medications on file         REVIEW OF SYSTEMS   Review of Systems See History of Present Illness  PHYSICAL EXAM     ED Triage Vitals   Enc Vitals Group      BP 08/11/22 0840 120/70      Heart  Rate 08/11/22 0840 67      Resp Rate 08/11/22 0840 18      Temp 08/11/22 0840 98.1 F (36.7 C)      Temp Source 08/11/22 0840 Oral      SpO2 08/11/22 0840 98 %      Weight 08/11/22 0841 77.4 kg      Height 08/11/22 0841 1.676 m      Head Circumference --       Peak Flow --       Pain Score 08/11/22 0841 8      Pain Loc --       Pain Edu? --       Excl. in GC? --      Physical Exam   ***  MEDICAL DECISION MAKING     PRIMARY PROBLEM LIST      {CEP ACUITY:59028} DIAGNOSIS:***  {Chronic Illness Impacting Care of the above problem:59030} {Explain (Optional):59078}  {Differential Diagnosis:59053}    DISCUSSION      ***    {If patient is being hospitalized is severe sepsis or septic shock suspected?:59467}      {Was management discussed with a consultant?:59037}  {Was the decision around the need for surgery discussed with consultant:59056::"N/A"}  {External Records Reviewed?:59023}    Additional Notes    {Diagnostic test considered and not performed:59031::"N/A"}  {Prescription medications considered and not given:59033::"N/A"}  {Hospitalization considered but not done:59032::"N/A"}  {Social Determinants of Health Considerations:59036::"N/A"}  {Was there decision to not resuscitate or to de-escalate care due to poor prognosis?:59057}          Vital Signs: Reviewed the patient's vital signs.   Nursing Notes: Reviewed and utilized available nursing notes.  Medical Records Reviewed: Reviewed available past medical records.  Counseling: The emergency provider has spoken with the patient and discussed today's findings, in addition to providing specific details for the plan of care.  Questions are answered and there is agreement with the plan.      MIPS DOCUMENTATION    {ORAL ANTIBIOTICS given for otitis externa due to:59079}  {ACUTE BRONCHITIS Medical reasoning for giving this patient oral antibiotics for acute bronchitis are the following (Optional):59080}  {Medical reasoning for giving this patient oral antibiotics for acute sinusitis are the following (Optional):59081}  {HEAD TRAUMA Indications for head CT due to trauma (Optional):59082}    CARDIAC STUDIES    The following cardiac studies were independently interpreted by me the Emergency Medicine Provider.  For full cardiac study results please see chart.    {Monitor Strip Interpretation:59688}  {ZOXW:96045}  {Rhythm:59687}  {ST segments:59689}    {EKG interpretation:59684}  {Comparison:59859}  {Time:64077}  {Rate:59685}  {Rhythm:59687}  {ST segments:59689}  {STEMI?:64073}  {EKG interpretation:59690}    {EKG interpretation:59684}  {Comparison:59859}  {Time:64077}  {Rate:59685}  {Rhythm:59687}  {ST segments:59689}  {STEMI?:64073}  {EKG interpretation:59690}  EMERGENCY IMAGING STUDIES    The following imagine studies were independently interpreted by me (emergency medicine provider):    {Xray interpreted by ED provider? (Optional):59468} {SIDE (Optional):59475}  {Comparison:59859}  {RESULT:59469}  {IMPRESSION:59470}    {CT interpreted by provider? (Optional):59471}  {Comparison:59859}  {WUJWJX:91478}  {IMPRESSION:59474}  RADIOLOGY IMAGING STUDIES      XR Chest 2 Views   Final Result    Minimal blunting of the right CP angle. Otherwise normal.      Kinnie Feil, MD   08/11/2022 9:36 AM           EMERGENCY DEPT. MEDICATIONS      ED Medication Orders (From admission, onward)  None            LABORATORY RESULTS    Ordered and independently interpreted AVAILABLE laboratory tests.   Results       Procedure Component Value Units Date/Time    COVID-19 (SARS-CoV-2) and Influenza A/B & RSV (Cepheid)- Age less than 5 years [[id number]]  (Abnormal) Collected: 08/11/22 0857    Specimen: Nasopharyngeal Swab Updated: 08/11/22 1003     Purpose of COVID testing Diagnostic -[facility]     SARS-CoV-2 Specimen Source Nasal Swab     SARS CoV 2 Overall Result Negative     Influenza A Negative     Influenza B Negative     Respiratory Syncytial Virus Positive    Narrative:      o Collect and clearly label specimen type:  o PREFERRED-Upper respiratory specimen: One Nasal Swab in  Transport Media.  o Hand deliver to laboratory ASAP  Diagnostic -[facility]              CRITICAL CARE/PROCEDURES    Procedures  ***Critical care?  DIAGNOSIS      Diagnosis:  Final diagnoses:   None       Disposition:  ED Disposition       None            Prescriptions:  Patient's Medications    No medications on file           This note was generated by the Epic EMR system/ Dragon speech recognition and may contain inherent errors or omissions not intended by the user. Grammatical errors, random word insertions, deletions and pronoun errors  are occasional consequences of this technology due to software limitations. Not all errors are caught or corrected. If there are questions or concerns about the content of this note or information contained within the body of this dictation they should be addressed directly with the author for clarification.    {THIS REVIEW SECTION WILL AUTODELETE ONCE NOTE IS SIGNED    END REVIEW SECTION(Optional):55325}

## 2022-08-11 NOTE — ED Triage Notes (Signed)
[name] [name] is a 66 y.o. male ambulatory to triage c/o URI symptoms, sore throat, runny nose, wheezing, diaphoretic at night, trouble sleeping x 5-6 days.  Pt denies flu/covid vaccination.  Dari interpreter used for triage.

## 2022-08-11 NOTE — Discharge Instructions (Addendum)
You came to the emergency room with flulike symptoms.  Your flu and COVID test were negative.  Your chest x-ray was negative for pneumonia but you do have RSV.  RSV is a flulike virus that causes nasal congestion coughing and wheezing.  We gave you some steroids today which should help with the inflammation in your chest.  Please take either 2 puffs of the albuterol 1 neb treatment every 4 hours scheduled for the next 2 or 3 days.  Please take 1 Tessalon Perle every 8 hours as needed for cough.  Please take Flonase 2 puffs in your nose daily for the next 7 days to help with the nasal congestion.  Please follow-up closely with your primary care physician to make sure you are improving.  Please rest and stay hydrated until you are feeling better.  The symptoms can last for up to 7 to 10 days after the onset.

## 2022-08-16 ENCOUNTER — Encounter (INDEPENDENT_AMBULATORY_CARE_PROVIDER_SITE_OTHER): Payer: Self-pay | Admitting: Student in an Organized Health Care Education/Training Program

## 2022-08-16 ENCOUNTER — Ambulatory Visit (INDEPENDENT_AMBULATORY_CARE_PROVIDER_SITE_OTHER)
Payer: No Typology Code available for payment source | Admitting: Student in an Organized Health Care Education/Training Program

## 2022-08-16 ENCOUNTER — Other Ambulatory Visit (INDEPENDENT_AMBULATORY_CARE_PROVIDER_SITE_OTHER): Payer: Self-pay | Admitting: Student in an Organized Health Care Education/Training Program

## 2022-08-16 VITALS — BP 118/77 | HR 67 | Temp 97.7°F | Wt 166.6 lb

## 2022-08-16 DIAGNOSIS — Z131 Encounter for screening for diabetes mellitus: Secondary | ICD-10-CM

## 2022-08-16 DIAGNOSIS — Z974 Presence of external hearing-aid: Secondary | ICD-10-CM

## 2022-08-16 DIAGNOSIS — E559 Vitamin D deficiency, unspecified: Secondary | ICD-10-CM

## 2022-08-16 LAB — POCT HEMOGLOBIN A1C: POCT Hgb A1C: 5.4 % (ref 3.9–5.9)

## 2022-08-16 NOTE — Addendum Note (Signed)
Addended by: Dorena Cookey on: 08/16/2022 10:35 AM     Modules accepted: Orders

## 2022-08-16 NOTE — Progress Notes (Signed)
[facility] [place]  OFFICE VISIT               Chief Complaint   Patient presents with    Diabetes Follow-up     Pt is fasting.       HPI: [name] [name] is a 66 y.o. presenting today fir diabetes follow up. A1c done 6 months ago was 5.1. Patient is not on any anti-hyperglycemics. He is focused on following closely with his health given his age. Denies chest pain, SOB, LE swelling.     Also notes concerns about hearing that have been ongoing for over a year. Currently wears hearing aids bilaterally. Would like to have hearing aids checked as it has been about a year. He feels its working but sometimes he feels like its not properly working. Interested in seeing ENT.     ROS: See HPI.     Past Medical History:   Diagnosis Date    Seasonal allergic rhinitis        EXAM:  BP 118/77 (BP Site: Right arm, Patient Position: Sitting, Cuff Size: Large)   Pulse 67   Temp 97.7 F (36.5 C) (Temporal)   Wt 75.6 kg (166 lb 9.6 oz)   SpO2 97%   BMI 26.89 kg/m     Physical Exam       IMAGING & LABS:   '  Chemistry        Component Value Date/Time    NA 139 01/11/2022 1438    K 4.6 01/11/2022 1438    CL 105 01/11/2022 1438    CO2 30 (H) 01/11/2022 1438    BUN 12.0 01/11/2022 1438    CREAT 0.8 01/11/2022 1438    GLU 93 01/11/2022 1438        Component Value Date/Time    CA 9.7 01/11/2022 1438    ALKPHOS 87 01/11/2022 1438    AST 21 01/11/2022 1438    ALT 17 01/11/2022 1438    BILITOTAL 0.3 01/11/2022 1438        Lab Results   Component Value Date    WBC 6.65 01/11/2022    HGB 14.4 01/11/2022    HCT 44.7 01/11/2022    MCV 89.9 01/11/2022    PLT 304 01/11/2022     Lab Results   Component Value Date    CHOL 173 01/11/2022     Lab Results   Component Value Date    HDL 45 01/11/2022     Lab Results   Component Value Date    LDL 109 (H) 01/11/2022     Lab Results   Component Value Date    TRIG 95 01/11/2022     Lab Results   Component Value Date    HGBA1C 5.1 01/11/2022     Lab Results   Component Value  Date    HGBA1C 5.4 08/16/2022           ASSESSMENT and PLAN:  1. Screening for diabetes mellitus  - POCT Hemoglobin A1C= 5.4 today/ (5.1 in May 2023)  - Encouraged low fat/low carb diet  - Lipid panel; Future    2. Uses hearing aid  - Requesting annual hearing aid check  - ENT Referral: Angela Adam, MD ([facility]); Future       Jimmey Hengel O. Cyndia Bent, DO, MPH

## 2022-08-16 NOTE — Progress Notes (Signed)
araHave you seen any specialists since your last visit with [place]?  No      The patient was informed that the following HM items are still outstanding:   influenza vaccine

## 2022-08-17 LAB — LIPID PANEL
Cholesterol / HDL Ratio: 4.3 ratio (ref 0.0–5.0)
Cholesterol: 189 mg/dL (ref 100–199)
HDL: 44 mg/dL (ref >39)
LDL Chol Calculated (NIH): 127 mg/dL — ABNORMAL HIGH (ref 0–99)
Triglycerides: 96 mg/dL (ref 0–149)
VLDL Calculated: 18 mg/dL (ref 5–40)

## 2022-08-17 LAB — POTASSIUM: Potassium: 4.1 mmol/L (ref 3.5–5.2)

## 2022-08-17 LAB — VITAMIN D,25 OH,TOTAL: Vitamin D 25-Hydroxy: 23.7 ng/mL — ABNORMAL LOW (ref 30.0–100.0)

## 2022-08-17 MED ORDER — VITAMIN D (ERGOCALCIFEROL) 1.25 MG (50000 UT) PO CAPS
50000.0000 [IU] | ORAL_CAPSULE | ORAL | 0 refills | Status: DC
Start: 2022-10-05 — End: 2022-09-14

## 2022-08-23 ENCOUNTER — Encounter (INDEPENDENT_AMBULATORY_CARE_PROVIDER_SITE_OTHER): Payer: Self-pay | Admitting: Student in an Organized Health Care Education/Training Program

## 2022-08-23 ENCOUNTER — Telehealth (INDEPENDENT_AMBULATORY_CARE_PROVIDER_SITE_OTHER): Payer: Self-pay | Admitting: Student in an Organized Health Care Education/Training Program

## 2022-09-11 ENCOUNTER — Ambulatory Visit
Admission: AD | Admit: 2022-09-11 | Payer: No Typology Code available for payment source | Source: Other Acute Inpatient Hospital | Admitting: Cardiology

## 2022-09-11 ENCOUNTER — Inpatient Hospital Stay
Admission: AD | Admit: 2022-09-11 | Discharge: 2022-09-14 | DRG: 174 | Disposition: A | Discharge status: Home Health | Payer: No Typology Code available for payment source | Source: Other Acute Inpatient Hospital | Attending: Pediatrics | Admitting: Pediatrics

## 2022-09-11 ENCOUNTER — Encounter
Admission: AD | Disposition: A | Discharge status: Home Health | Payer: Self-pay | Source: Other Acute Inpatient Hospital | Attending: Pediatrics

## 2022-09-11 ENCOUNTER — Emergency Department
Admission: EM | Admit: 2022-09-11 | Discharge: 2022-09-11 | Disposition: A | Discharge status: Other Acute Inpatient Hospital | Payer: No Typology Code available for payment source | Attending: Emergency Medicine | Admitting: Emergency Medicine

## 2022-09-11 DIAGNOSIS — Z6829 Body mass index (BMI) 29.0-29.9, adult: Secondary | ICD-10-CM

## 2022-09-11 DIAGNOSIS — Z79899 Other long term (current) drug therapy: Secondary | ICD-10-CM

## 2022-09-11 DIAGNOSIS — E1159 Type 2 diabetes mellitus with other circulatory complications: Secondary | ICD-10-CM

## 2022-09-11 DIAGNOSIS — I213 ST elevation (STEMI) myocardial infarction of unspecified site: Secondary | ICD-10-CM | POA: Diagnosis present

## 2022-09-11 DIAGNOSIS — E785 Hyperlipidemia, unspecified: Secondary | ICD-10-CM | POA: Diagnosis present

## 2022-09-11 DIAGNOSIS — I2510 Atherosclerotic heart disease of native coronary artery without angina pectoris: Secondary | ICD-10-CM

## 2022-09-11 DIAGNOSIS — E7849 Other hyperlipidemia: Secondary | ICD-10-CM

## 2022-09-11 DIAGNOSIS — D509 Iron deficiency anemia, unspecified: Secondary | ICD-10-CM | POA: Diagnosis present

## 2022-09-11 DIAGNOSIS — E119 Type 2 diabetes mellitus without complications: Secondary | ICD-10-CM | POA: Diagnosis present

## 2022-09-11 DIAGNOSIS — E663 Overweight: Secondary | ICD-10-CM | POA: Diagnosis present

## 2022-09-11 DIAGNOSIS — E559 Vitamin D deficiency, unspecified: Secondary | ICD-10-CM

## 2022-09-11 DIAGNOSIS — I2121 ST elevation (STEMI) myocardial infarction involving left circumflex coronary artery: Principal | ICD-10-CM

## 2022-09-11 DIAGNOSIS — I2119 ST elevation (STEMI) myocardial infarction involving other coronary artery of inferior wall: Secondary | ICD-10-CM

## 2022-09-11 HISTORY — PX: LEFT HEART CATH POSS PCI: CATH6

## 2022-09-11 LAB — CBC AND DIFFERENTIAL
Absolute NRBC: 0 10*3/uL (ref 0.00–0.00)
Basophils Absolute Automated: 0.03 10*3/uL (ref 0.00–0.08)
Basophils Automated: 0.4 %
Eosinophils Absolute Automated: 0.34 10*3/uL (ref 0.00–0.44)
Eosinophils Automated: 4.3 %
Hematocrit: 40.4 % (ref 37.6–49.6)
Hgb: 13.3 g/dL (ref 12.5–17.1)
Immature Granulocytes Absolute: 0.01 10*3/uL (ref 0.00–0.07)
Immature Granulocytes: 0.1 %
Instrument Absolute Neutrophil Count: 4.12 10*3/uL (ref 1.10–6.33)
Lymphocytes Absolute Automated: 2.74 10*3/uL (ref 0.42–3.22)
Lymphocytes Automated: 34.5 %
MCH: 29.4 pg (ref 25.1–33.5)
MCHC: 32.9 g/dL (ref 31.5–35.8)
MCV: 89.2 fL (ref 78.0–96.0)
MPV: 10.4 fL (ref 8.9–12.5)
Monocytes Absolute Automated: 0.71 10*3/uL (ref 0.21–0.85)
Monocytes: 8.9 %
Neutrophils Absolute: 4.12 10*3/uL (ref 1.10–6.33)
Neutrophils: 51.8 %
Nucleated RBC: 0 /100 WBC (ref 0.0–0.0)
Platelets: 231 10*3/uL (ref 142–346)
RBC: 4.53 10*6/uL (ref 4.20–5.90)
RDW: 13 % (ref 11–15)
WBC: 7.95 10*3/uL (ref 3.10–9.50)

## 2022-09-11 LAB — COMPREHENSIVE METABOLIC PANEL
ALT: 18 U/L (ref 0–55)
AST (SGOT): 22 U/L (ref 5–41)
Albumin/Globulin Ratio: 1.3 (ref 0.9–2.2)
Albumin: 3.9 g/dL (ref 3.5–5.0)
Alkaline Phosphatase: 58 U/L (ref 37–117)
Anion Gap: 10 (ref 5.0–15.0)
BUN: 14 mg/dL (ref 9.0–28.0)
Bilirubin, Total: 0.4 mg/dL (ref 0.2–1.2)
CO2: 25 mEq/L (ref 17–29)
Calcium: 9.2 mg/dL (ref 8.5–10.5)
Chloride: 101 mEq/L (ref 99–111)
Creatinine: 0.8 mg/dL (ref 0.5–1.5)
Globulin: 3 g/dL (ref 2.0–3.6)
Glucose: 144 mg/dL — ABNORMAL HIGH (ref 70–100)
Potassium: 3.6 mEq/L (ref 3.5–5.3)
Protein, Total: 6.9 g/dL (ref 6.0–8.3)
Sodium: 136 mEq/L (ref 135–145)
eGFR: >60 mL/min/{1.73_m2} (ref >=60)

## 2022-09-11 LAB — PT AND APTT
PT INR: 1.2 — ABNORMAL HIGH (ref 0.9–1.1)
PT: 13.7 s — ABNORMAL HIGH (ref 10.1–12.9)
PTT: 142 s (ref 27–39)

## 2022-09-11 LAB — ACT KAOLIN POCT
i-STAT ACT Kaolin: 217 s — ABNORMAL HIGH (ref 90–149)
i-STAT ACT Kaolin: 271 s — ABNORMAL HIGH (ref 90–149)

## 2022-09-11 LAB — HIGH SENSITIVITY TROPONIN-I: hs Troponin-I: 10.7 ng/L

## 2022-09-11 SURGERY — LEFT HEART CATH POSS PCI
Anesthesia: Anesthesia Choice | Laterality: Left

## 2022-09-11 MED ORDER — INSULIN LISPRO 100 UNIT/ML SOLN (WRAP)
1.0000 [IU] | Freq: Three times a day (TID) | Status: DC
Start: 2022-09-12 — End: 2022-09-14

## 2022-09-11 MED ORDER — POTASSIUM CHLORIDE 10 MEQ/100ML IV SOLN (WRAP)
10.0000 meq | INTRAVENOUS | Status: DC | PRN
Start: 2022-09-11 — End: 2022-09-13

## 2022-09-11 MED ORDER — HEPARIN SODIUM (PORCINE) 5000 UNIT/ML IJ SOLN
5000.0000 [IU] | Freq: Three times a day (TID) | INTRAMUSCULAR | Status: DC
Start: 2022-09-12 — End: 2022-09-14
  Administered 2022-09-12 – 2022-09-14 (×7): 5000 [IU] via SUBCUTANEOUS
  Filled 2022-09-11 (×7): qty 1

## 2022-09-11 MED ORDER — POTASSIUM CHLORIDE CRYS ER 20 MEQ PO TBCR
0.0000 meq | EXTENDED_RELEASE_TABLET | ORAL | Status: DC | PRN
Start: 2022-09-11 — End: 2022-09-13

## 2022-09-11 MED ORDER — SODIUM PHOSPHATES 3 MMOLE/ML IV SOLN (WRAP)
25.0000 mmol | INTRAVENOUS | Status: DC | PRN
Start: 2022-09-11 — End: 2022-09-13

## 2022-09-11 MED ORDER — IODIXANOL 320 MG/ML IV SOLN
INTRAVENOUS | Status: AC | PRN
Start: 2022-09-11 — End: 2022-09-11
  Administered 2022-09-11: 160 mL via INTRA_ARTERIAL

## 2022-09-11 MED ORDER — TICAGRELOR 90 MG PO TABS
180.0000 mg | ORAL_TABLET | Freq: Once | ORAL | Status: AC
Start: 2022-09-11 — End: 2022-09-11
  Administered 2022-09-11: 180 mg via ORAL
  Filled 2022-09-11: qty 2

## 2022-09-11 MED ORDER — INSULIN LISPRO 100 UNIT/ML SOLN (WRAP)
1.0000 [IU] | Freq: Every evening | Status: DC
Start: 2022-09-11 — End: 2022-09-14

## 2022-09-11 MED ORDER — HEPARIN SODIUM (PORCINE) 1000 UNIT/ML IJ SOLN
INTRAMUSCULAR | Status: AC
Start: 2022-09-11 — End: ?
  Filled 2022-09-11: qty 10

## 2022-09-11 MED ORDER — CALCIUM GLUCONATE-NACL 1-0.675 GM/50ML-% IV SOLN
1.0000 g | INTRAVENOUS | Status: DC | PRN
Start: 2022-09-11 — End: 2022-09-14

## 2022-09-11 MED ORDER — POTASSIUM CHLORIDE 20 MEQ PO PACK
0.0000 meq | PACK | ORAL | Status: DC | PRN
Start: 2022-09-11 — End: 2022-09-13

## 2022-09-11 MED ORDER — GLUCAGON 1 MG IJ SOLR (WRAP)
1.0000 mg | INTRAMUSCULAR | Status: DC | PRN
Start: 2022-09-11 — End: 2022-09-14

## 2022-09-11 MED ORDER — SENNOSIDES-DOCUSATE SODIUM 8.6-50 MG PO TABS
1.0000 | ORAL_TABLET | Freq: Two times a day (BID) | ORAL | Status: DC
Start: 2022-09-11 — End: 2022-09-14
  Filled 2022-09-11 (×3): qty 1

## 2022-09-11 MED ORDER — ACETAMINOPHEN 325 MG PO TABS
650.0000 mg | ORAL_TABLET | Freq: Four times a day (QID) | ORAL | Status: DC | PRN
Start: 2022-09-11 — End: 2022-09-14
  Administered 2022-09-12: 650 mg via ORAL
  Filled 2022-09-11: qty 2

## 2022-09-11 MED ORDER — MELATONIN 3 MG PO TABS
3.0000 mg | ORAL_TABLET | Freq: Every evening | ORAL | Status: DC | PRN
Start: 2022-09-11 — End: 2022-09-14

## 2022-09-11 MED ORDER — HEPARIN SODIUM (PORCINE) 5000 UNIT/ML IJ SOLN
4000.0000 [IU] | Freq: Once | INTRAMUSCULAR | Status: AC
Start: 2022-09-11 — End: 2022-09-11
  Administered 2022-09-11: 4000 [IU] via INTRAVENOUS
  Filled 2022-09-11: qty 1

## 2022-09-11 MED ORDER — SODIUM PHOSPHATES 3 MMOLE/ML IV SOLN (WRAP)
35.0000 mmol | INTRAVENOUS | Status: DC | PRN
Start: 2022-09-11 — End: 2022-09-13

## 2022-09-11 MED ORDER — HEPARIN (PORCINE) IN NACL 1000-0.9 UT/500ML-% IV SOLN - TABLE FLUSH (SEDATION NARRATOR)
INTRAVENOUS | Status: AC | PRN
Start: 2022-09-11 — End: 2022-09-11
  Administered 2022-09-11: 4000 [IU]

## 2022-09-11 MED ORDER — HEPARIN (PORCINE) IN NACL 2-0.9 UNIT/ML-% IJ SOLN (WRAP)
INTRAVENOUS | Status: AC
Start: 2022-09-11 — End: ?
  Filled 2022-09-11: qty 500

## 2022-09-11 MED ORDER — FENTANYL CITRATE (PF) 50 MCG/ML IJ SOLN (WRAP)
INTRAMUSCULAR | Status: AC
Start: 2022-09-11 — End: ?
  Filled 2022-09-11: qty 2

## 2022-09-11 MED ORDER — HEPARIN (PORCINE) IN NACL 2-0.9 UNIT/ML-% IJ SOLN (WRAP)
INTRAVENOUS | Status: AC
Start: 2022-09-11 — End: ?
  Filled 2022-09-11: qty 1500

## 2022-09-11 MED ORDER — NITROGLYCERIN IN D5W 200-5 MCG/ML-% IV SOLN VIAL
INTRAVENOUS | Status: AC
Start: 2022-09-11 — End: ?
  Filled 2022-09-11: qty 10

## 2022-09-11 MED ORDER — ASPIRIN 81 MG PO TBEC
81.0000 mg | DELAYED_RELEASE_TABLET | Freq: Every day | ORAL | Status: DC
Start: 2022-09-12 — End: 2022-09-14
  Administered 2022-09-12 – 2022-09-14 (×3): 81 mg via ORAL
  Filled 2022-09-11 (×3): qty 1

## 2022-09-11 MED ORDER — MAGNESIUM SULFATE IN D5W 1-5 GM/100ML-% IV SOLN
1.0000 g | INTRAVENOUS | Status: DC | PRN
Start: 2022-09-11 — End: 2022-09-13

## 2022-09-11 MED ORDER — SODIUM CHLORIDE 0.9 % IV SOLN
INTRAVENOUS | Status: DC
Start: 2022-09-11 — End: 2022-09-12

## 2022-09-11 MED ORDER — ATORVASTATIN CALCIUM 80 MG PO TABS
80.0000 mg | ORAL_TABLET | Freq: Every evening | ORAL | Status: DC
Start: 2022-09-11 — End: 2022-09-14
  Administered 2022-09-11 – 2022-09-13 (×3): 80 mg via ORAL
  Filled 2022-09-11 (×3): qty 1

## 2022-09-11 MED ORDER — DEXTROSE 50 % IV SOLN
12.5000 g | INTRAVENOUS | Status: DC | PRN
Start: 2022-09-11 — End: 2022-09-14

## 2022-09-11 MED ORDER — LIDOCAINE HCL 1 % IJ SOLN
INTRAMUSCULAR | Status: AC | PRN
Start: 2022-09-11 — End: 2022-09-11
  Administered 2022-09-11: 8 mL

## 2022-09-11 MED ORDER — DIPHENHYDRAMINE HCL 50 MG/ML IJ SOLN
INTRAMUSCULAR | Status: AC
Start: 2022-09-11 — End: ?
  Filled 2022-09-11: qty 1

## 2022-09-11 MED ORDER — NITROGLYCERIN 0.4 MG SL SUBL
0.4000 mg | SUBLINGUAL_TABLET | SUBLINGUAL | Status: AC
Start: 2022-09-11 — End: 2022-09-11
  Administered 2022-09-11 (×2): 0.4 mg via SUBLINGUAL
  Filled 2022-09-11: qty 25

## 2022-09-11 MED ORDER — ATROPINE SULFATE 0.1 MG/ML IJ SOLN (WRAP)
INTRAMUSCULAR | Status: AC | PRN
Start: 2022-09-11 — End: 2022-09-11
  Administered 2022-09-11: 1 mg via INTRAVENOUS

## 2022-09-11 MED ORDER — TICAGRELOR 90 MG PO TABS
90.0000 mg | ORAL_TABLET | Freq: Two times a day (BID) | ORAL | Status: DC
Start: 2022-09-12 — End: 2022-09-14
  Administered 2022-09-12 – 2022-09-14 (×5): 90 mg via ORAL
  Filled 2022-09-11 (×5): qty 1

## 2022-09-11 MED ORDER — ASPIRIN 81 MG PO CHEW
324.0000 mg | CHEWABLE_TABLET | Freq: Once | ORAL | Status: AC
Start: 2022-09-11 — End: 2022-09-11
  Administered 2022-09-11: 324 mg via ORAL
  Filled 2022-09-11: qty 4

## 2022-09-11 MED ORDER — MAGNESIUM OXIDE 400 MG TABS (WRAP)
400.0000 mg | ORAL_TABLET | ORAL | Status: DC | PRN
Start: 2022-09-11 — End: 2022-09-13

## 2022-09-11 MED ORDER — SODIUM PHOSPHATES 3 MMOLE/ML IV SOLN (WRAP)
15.0000 mmol | INTRAVENOUS | Status: DC | PRN
Start: 2022-09-11 — End: 2022-09-13

## 2022-09-11 MED ORDER — MIDAZOLAM HCL 1 MG/ML IJ SOLN (WRAP)
INTRAMUSCULAR | Status: AC
Start: 2022-09-11 — End: ?
  Filled 2022-09-11: qty 2

## 2022-09-11 MED ORDER — VERAPAMIL HCL 2.5 MG/ML IV SOLN
INTRAVENOUS | Status: AC
Start: 2022-09-11 — End: ?
  Filled 2022-09-11: qty 2

## 2022-09-11 MED ORDER — NITROGLYCERIN IN D5W 200-5 MCG/ML-% IV SOLN VIAL
INTRAVENOUS | Status: AC | PRN
Start: 2022-09-11 — End: 2022-09-11
  Administered 2022-09-11: 200 ug via INTRA_ARTERIAL

## 2022-09-11 MED ORDER — LIDOCAINE HCL 1 % IJ SOLN
INTRAMUSCULAR | Status: AC
Start: 2022-09-11 — End: ?
  Filled 2022-09-11: qty 10

## 2022-09-11 MED ORDER — GLUCOSE 40 % PO GEL (WRAP)
15.0000 g | ORAL | Status: DC | PRN
Start: 2022-09-11 — End: 2022-09-14

## 2022-09-11 MED ORDER — HEPARIN SODIUM (PORCINE) 1000 UNIT/ML IJ SOLN
INTRAMUSCULAR | Status: AC | PRN
Start: 2022-09-11 — End: 2022-09-11
  Administered 2022-09-11: 5000 [IU] via INTRAVENOUS

## 2022-09-11 MED ORDER — DEXTROSE 10 % IV BOLUS
12.5000 g | INTRAVENOUS | Status: DC | PRN
Start: 2022-09-11 — End: 2022-09-14

## 2022-09-11 MED ORDER — VH VERAPAMIL HCL 2.5 MG/ML IV SOLN (IR NARRATOR)
INTRAVENOUS | Status: AC | PRN
Start: 2022-09-11 — End: 2022-09-11
  Administered 2022-09-11: 2.5 mg via INTRA_ARTERIAL

## 2022-09-11 MED ORDER — ATROPINE SULFATE 0.1 MG/ML IJ SOLN (WRAP)
INTRAMUSCULAR | Status: AC
Start: 2022-09-11 — End: ?
  Filled 2022-09-11: qty 10

## 2022-09-11 MED ORDER — HEPARIN SODIUM (PORCINE) 1000 UNIT/ML IJ SOLN
INTRAMUSCULAR | Status: AC | PRN
Start: 2022-09-11 — End: 2022-09-11
  Administered 2022-09-11: 2000 [IU] via INTRAVENOUS

## 2022-09-11 SURGICAL SUPPLY — 34 items
CATHETER ANGIO SS NYL PU RADL TIG 4 CRV (Catheter) ×1
CATHETER ANGIO TRILON 145D PGTL CRV EXPO (Catheter) ×1
CATHETER BALLOON DILATATION SAPPHIRE NC 24 CORONARY L12 MM ODSEC 2.5MM (Catheter) IMPLANT
CATHETER BLNDIL SPHR II PRO 2.5MM LF (Balloons) ×1
CATHETER BLNDIL SPHR NC24 2.5MM LF STRL (Catheter)
CATHETER EBU3.5 CURVE OD6 FR LARGE LUMEN (Catheter) ×1
CATHETER GD NYL PLMR EBU3.5 CRV LNCHR 6 (Catheter) ×1
CATHETER OD5 FR L110 CM FULL LENGTH WIRE (Catheter) ×1
CATHETER OD5 FR L110 CM FULL LENGTH WIRE BRAID ROBUST SHAFT 145 D (Catheter) IMPLANT
CATHETER OD5 FR L110 CM LARGE LUMEN (Catheter) ×1
CATHETER OD5 FR L110 CM LARGE LUMEN SIDEHOLE 2 BRAID SOFT TIP RADIAL (Catheter) IMPLANT
CATHETER OD6 FR L100 CM EBU3.5 CURVE LAUNCHER GUIDING LARGE LUMEN (Catheter) IMPLANT
CATHETER ODSEC2.5 MM SAPPHIRE II PRO BALLOON DILATATION L12 MM PTCA (Balloons) IMPLANT
CATHETER ULTRASOUND OD3 FR ODSEC1 MM (Catheter) ×1
CATHETER ULTRASOUND OD3 FR ODSEC1 MM L135 CM CORONARY IMAGING AVVIGO (Catheter) IMPLANT
CATHETER ULTRASOUND OD3 FR ODSEC1 MM L135 CM CORONARY IMAGING AVVIGOâ„¢ (Catheter) ×1 IMPLANT
CATHETER US OPTICROSS HD 3FR 1MM 135CM (Catheter) ×1
DEVICE ULTRASOUND CATHETER PULLBACK SLED (Other) ×1
DEVICE ULTRASOUND CATHETER PULLBACK SLED AVVIGO (Other) IMPLANT
DEVICE ULTRASOUND CATHETER PULLBACK SLED AVVIGOâ„¢ (Other) ×1 IMPLANT
DEVICE US GLXY IVUS ILAB STRL CATH (Other) ×1
GUIDEWIRE VASC SLIP-COAT HBRD 3CM STRG (Guidwire) ×1
GUIDEWIRE VASCULAR OD.014 IN L180 CM L20 CM PROWATER 3 CM STRAIGHT (Guidwire) IMPLANT
GUIDEWIRE VASCULAR OD.014 IN L180CM L20 (Guidwire) ×1
KIT INTRO SS HDRPH .021IN 35MM FLX STRG (Sheaths) ×1
KIT INTRODUCER L10 CM .021 IN L35 MM (Sheaths) ×1
KIT INTRODUCER L10 CM .021 IN L35 MM FLEX STRAIGHT SHEATH DILATOR (Sheaths) IMPLANT
NEEDLE PRC THNWL 21GA 2.5CM STRL WO (Needles) ×1
NEEDLE PROCEDURE L2.5 CM OD21 GA THIN (Needles) ×1
NEEDLE PROCEDURE L2.5 CM OD21 GA THIN WALL COOK WITHOUT BASEPLATE (Needles) IMPLANT
STENT COR ONYX FRNTR 3MM 18MM LF STRL RX (Stent) ×1 IMPLANT
STENT OD3 MM L18 MM ONYX FRONTIER CORONARY RAPID EXCHANGE (Stent) IMPLANT
SYSTEM COR STNT RSLT ONYX 3.5MM 18MM RX (Stent) ×2 IMPLANT
SYSTEM CORONARY STENT L18 MM ID3.5 MM ONYX FRONTIER RAPID EXCHANGE (Stent) IMPLANT

## 2022-09-11 NOTE — ED Notes (Signed)
NTG #3 held.

## 2022-09-11 NOTE — Progress Notes (Signed)
CATH LAB PROCEDURE HANDOFF REPORT    INDICATIONS:   STEMI    ALLERGIES:   Patient has no known allergies.     ACC BLEEDING RISK SCORE        MEDICAL HISTORY:   Past Medical History:   Diagnosis Date    Seasonal allergic rhinitis         NAME OF PROCEDURE:  Left heart cath, DES to LCX      Lines/Drains/Airways:    Patient Lines/Drains/Airways Status       Active Lines, Drains and Airways       Name Placement date Placement time Site Days    Peripheral IV 09/11/22 18 G Standard Left Antecubital 09/11/22  1842  Antecubital  less than 1    Peripheral IV 18 G Right Antecubital --  --  Antecubital  --    Arterial Sheath 6 Fr. Right Radial 09/11/22  1943  Radial  less than 1              Inactive Lines, Drains and Airways       None                      MEDICATIONS:   Versed: 0 mg IV  Fentanyl: 0 mcg IV  Heparin:  16109 units IV  Benadryl: 0 mg IV  Nitroglycerin: 200 mcg IA  Loading Dose of: None PO    Active Antiplatelet/Anticoagulant Medications:  Current Facility-Administered Medications (Includes Only Anticoagulants, Misc. Hematological)   Medication Dose Route Last Admin   None       VITALS:   HR:  80             Rhythm: sinus rhythm  BP: 85/54   O2 SAT: 98% Nasal Cannula    PROCEDURE DETAILS:     Outcomes: successful PCI                                           Significant case events:  None    Final chest pain assessment::0/10    See Physicians Op note/ Report for details    HANDOFF DETAILS:    Access site: RRA     Access site checked with receiving RN; Unit 29.    TR band has 10 cc of air in it. No evidence of bleeding or hematoma at handoff.    Patient stable at time of hand off.    Bedside RN had no further questions at the time of handoff.

## 2022-09-11 NOTE — ED Notes (Signed)
Critical Lab Value relayed by Lab - PTT=142, Dr.Olderog made aware.

## 2022-09-11 NOTE — ED Triage Notes (Signed)
Prior to arrival,patient reported he was walking in mall. Had sudden onset of chest and sob. EKG completed upon arrival to ED. Code STEMI called. Awaiting transport to [facility].

## 2022-09-11 NOTE — ED Triage Notes (Signed)
W/C to triage re: CP approx 2 hours ago. Code STEMI called in triage.

## 2022-09-11 NOTE — Consults (Signed)
CARDIOLOGY consult NOTE  Date:  09/11/2022  9:06 PM    Jaleea Alesi Erby Pian, MD,   [facility]  Office phone:  [phone]     [facility] phone [phone], [facility] phone [phone]    Patient:  [name] [name].  DOB: Jan 14, 1956.  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  He subsequently presented to [facility] where electrocardiogram revealed acute injury pattern in the inferolateral leads.  He was subsequently transferred to [facility].  Emergent coronary angiography revealed a culprit lesion of 100% occlusion in his mid left circumflex artery.  He successfully underwent drug-eluting stent placement x 3 to his left circumflex artery with good angiographic results.    Patient has limited [place].  Primary language is Dari.  History obtained per chart and briefly from family.    REVIEW OF SYSTEMS:                                              ARROS, ROSSSSS     All other systems were reviewed and were negative except as noted in HPI        CARDIAC DIAGNOSTICS:                                         ECGGGGG     ECG:  (I independently reviewed the ECG tracing) normal sinus rhythm, inferolateral ST segment elevation myocardial infarction.    Telemetry:  (I independently reviewed the telemetry data) normal sinus rhythm, sinus bradycardia    Cardiac cath   LM       Angiographically normal  LAD     40% mid stenosis  LCx      20% proximal stenosis              100% mid stenosis with distal TIMI 0  flow  RCA     20% mid stenosis  LV        LVEF 50%; mid inferior wall hypokinesis; LVEDP 15 mmHg; no significant gradient across aortic valve  PAST MEDICAL HISTORY:     Past Medical History:   Diagnosis Date    Seasonal allergic rhinitis      Past Surgical History:   Procedure Laterality Date    NOSE SURGERY         SOCIAL HISTORY:     Social History     Tobacco Use    Smoking status: Never    Smokeless tobacco: Never   Substance Use Topics    Alcohol use: Not Currently       FAMILY HISTORY:  family history is not on file.    PHYSICAL EXAM:                                                        ARPE3, ARPE2, PEEEEE   Heart rate 60  Blood pressure 90/60  Respiratory rate 14    Constitutional :  no acute distress,  Eyes:  No Pallor or Icterus.  ENMT: mucous membranes moist.. No Cyanosis  Neck: No JVD.    Respiratory: Clear to auscultation anteriorly  Cardiovascular: Regular rate, no murmurs, normal S1, S2  Extremities: No lower extremity edema  Gastrointestinal Soft. Non-tender. Normoactive BS. No abdominal bruits    Neurologic: Grossly intact.    Musculoskeletal: No joint effusions noted  Psychiatric: normal mood    LABORATORY:     CBC w/Diff   Recent Labs   Lab 09/11/22  1843   WBC 7.95   Hgb 13.3   Hematocrit 40.4   Platelets 231        Basic Metabolic Profile   Recent Labs   Lab 09/11/22  1843   Sodium 136   Potassium 3.6   Chloride 101   CO2 25   BUN 14.0   Creatinine 0.8   eGFR >60.0   Glucose 144*   Calcium 9.2        Cardiac Enzymes   Recent Labs   Lab 09/11/22  1843   hs Troponin-I 10.7            D-dimer          Thyroid Studies         Invalid input(s): "FREET4"     Cholesterol Panel          Coagulation Studies   Recent Labs   Lab 09/11/22  1905   PT 13.7*   PT INR 1.2*   PTT 142*        MEDICATIONS:    INFUSION MEDS:      sodium chloride        SCHEDULED MEDS:     Current Facility-Administered Medications   Medication Dose Route Frequency    [START ON 09/12/2022] aspirin EC  81 mg Oral Daily     atorvastatin  80 mg Oral QHS    [START ON 09/12/2022] heparin (porcine)  5,000 Units Subcutaneous Q8H SCH    senna-docusate  1 tablet Oral Q12H SCH    [START ON 09/12/2022] ticagrelor  90 mg Oral Q12H      PRN MEDS:   acetaminophen    MISCELLANEOUS:                                                            DISCL   rcsThis note was generated by the Epic EMR system/ Dragon speech recognition and may contain inherent errors or omissions not intended by the user. Grammatical errors, random word insertions, deletions and pronoun errors  are occasional consequences of this technology due to software limitations. Not all errors are caught or corrected. If there are questions or concerns about the content of this note or information contained within the body of this dictation they should  be addressed directly with the author for clarification

## 2022-09-11 NOTE — H&P (Addendum)
 [facility]  History and Physical      Patient Name: [name] [name]  MRN: [id number]  Room: A2907/A2907-01  Code Status: Full Code      Assessment & Plan   MSICU Attending Assessment/Plan:  66 year old male with a history of diabetes who presented to the [facility] ED with acute chest pain and found to have ST elevations in the inferior lateral leads and was transferred to [facility] Cath Lab for STEMI.  He was found to have 100% occlusion in the mid left circumflex and is now status post placement of 3 drug-eluting stents to the circumflex with TIMI 3 revascularization flow.     Assessment:  STEMI - inferolateral   Possible ischemic cardiomyopathy  Coronary Artery Disease  Hyperlipidemia   Diabetes Mellitus    Plan:  Admit to ICU  Dual antiplatelet therapy  High-dose high intensity statin  Echo  Serial cardiac enzymes  Goal glucose between 140 and 180  Check hemoglobin A1c      Billing:   This patient is critically ill with life-threatening condition(s) and a high probability of sudden clinically significant deterioration due to the condition(s) noted in the assessment and plan, which requires the highest level of physician's / advance practice provider's preparedness to intervene urgently.  Full attention to the direct care of this patient was provided for the period of time noted, which includes review of laboratory data, radiology results, discussion with consultant(s) monitoring for potential decompensation and performing a history exam and medical decision making.  All critical care time personally performed today was exclusive of teaching, billable procedures, and not overlapping with any other physicians or advance practice providers.    The advanced practice provider, Christie Nottingham NP,   performed the substantive portion of this visit by performing more than 50% of the total time. I have personally examined the patient. I agree with the findings noted in the advance practice provider note and have  reviewed the patient's history, exam, laboratory findings, imaging results, monitoring data for potential decompensation, and additional findings found in detail within ICU team notes. Numerous life or organ-supporting interventions have been undertaken in addition to discussion with the consultant(s) and clinical staff, including advance practice providers, critical care fellows, house staff, and nurses, as well as counseling the patient/family members (as described above). I formulated all the medical decision-making and coordinated care as stated in the Assessment & Plan above in discussion with the advance practice provider.    Advance Practice Provider time: Minutes as noted in the APP note (independently)  Physician time: 18 minutes (independently)  Rounding time: 26 minutes (minutes together by the physician and advance practice provider - will be added to the physician time)    Total critical care time: 44 minutes    Nash Shearer, DO  09/12/2022 2:44 AM         Chief Complaint / Primary Reason for MSICU Evaluation   ST elevation myocardial infarction    History of Presenting Illness   [name] [name] is a 66 y.o. male w/ PMHx diabetes mellitus - otherwise healthy with no notable modifiable risk factors. He was walking in the mall with his family on the evening of 12/30 when he developed acute onset chest pain and presented to the [facility] ED. ECG showed ST elevations in the inferolateral leads - II, III, aVF, V5-V6, w/ T wave inversions in aVR, V2. Hemodynamically stable at time of presentation. He was transferred emergently to [facility] for cardiac cath, during which he was  found to have 100% occlusion in the mid Lcx, as well as 40% stenosis of LAD and 20% stenosis of RCA. LVEDP 15, LVEF 50% w/ mid-inferior wall hypokinesis. He received x3 DES to LCx w/ TIMI 3 revasc.     Admitted to [facility] MSICU following cardiac cath for further monitoring post-cath w/ DES.    Subjective   Past Medical History:     Past Medical  History:   Diagnosis Date    Seasonal allergic rhinitis        Past Surgical History:     Past Surgical History:   Procedure Laterality Date    NOSE SURGERY         Family History:   No family history on file.    Social History:     Social History     Socioeconomic History    Marital status: Married     Spouse name: Not on file    Number of children: Not on file    Years of education: Not on file    Highest education level: Not on file   Occupational History    Occupation: retired     Comment: Engineer, building services   Tobacco Use    Smoking status: Never    Smokeless tobacco: Never   Vaping Use    Vaping Use: Never used   Substance and Sexual Activity    Alcohol use: Not Currently    Drug use: Never    Sexual activity: Not on file   Other Topics Concern    Not on file   Social History Narrative    Lives with wife, daughter in law and son.     And 2 sons 2 daughter      Social Determinants of Health     Financial Resource Strain: Not on file   Food Insecurity: No Food Insecurity (08/11/2022)    Hunger Vital Sign     Worried About Running Out of Food in the Last Year: Never true     Ran Out of Food in the Last Year: Never true   Transportation Needs: Not on file   Physical Activity: Not on file   Stress: Not on file   Social Connections: Not on file   Intimate Partner Violence: Not At Risk (08/11/2022)    Humiliation, Afraid, Rape, and Kick questionnaire     Fear of Current or Ex-Partner: No     Emotionally Abused: No     Physically Abused: No     Sexually Abused: No   Housing Stability: Not on file       Allergies:   No Known Allergies       Objective   Physical Examination   Vitals Temp:  [98.3 F (36.8 C)]   Heart Rate:  [72-73]   Resp Rate:  [18-20]   BP: (102-156)/(55-86)   SpO2:  [99 %]   Weight:  [77.9 kg (171 lb 11.8 oz)]  // Temperature with 24 range  Vent Settings      General:   Alert, well-appearing adult male in no acute distress; nontoxic, no chest pain    Neuro:    Non-focal neuro exam alert & oriented  x 3     Lungs:   clear to auscultation, no wheezes, rales or rhonchi, symmetric air entry     Cardiac:   normal rate, regular rhythm, normal S1, S2, no murmurs, rubs, clicks or gallops     Abdomen:    soft, nontender, nondistended, no masses or  organomegaly     Extremities:   peripheral pulses normal no pedal edema    Skin:     Warm, dry and intact    Assessment   [name] [name] is a 66 y.o. male who presented to [facility] for an inferolateral STEMI, now s/p PCI w/ 3x DES to LCx.      Patient Active Problem List:  Inferolateral ST elevation myocardial infarction  S/P 3x DES to LCx  Coronary artery disease  Concern for ischemic cardiomyopathy  Hx type 2 diabetes mellitus    Neuro   Neuro High Impact Dx: None    Cardiovascular   Cardiac High Impact Dx: Acute Myocardial Infarction    Pulmonary  Pulm High Impact Dx: None    Renal  Renal High Impact Dx: None    Infections Disease  Infectious Disease High Impact Dx: None    Hematology  Heme High Impact Dx: None    Endo/Rheum  Endo High Impact: Acute Glycemic Conditions (hyperglycemia, hypoglycemia)    ICU Checklist  Sedation:   CAM-ICU:     CAM ICU:   Last Documented RASS:     Currently ordered infusions:    sodium chloride 75 mL/hr at 09/11/22 2222    Reviewed: Yes   Mobility:   Current Mobility Level:    Current PT Order: Yes  Current OT Order: Yes Reviewed: Yes   Respiratory (n/a if blank):   Ventilator Time:    Last Recorded Vent Mode:    Reviewed: N/A   Gastrointenstinal  Last Bowel Movement:   No data recorded Reviewed: Yes   CAUTI Prevention (n/a if blank):  Foley Day:        Reviewed: N/A   Blood Steam Infection Prevention (n/a if blank):    Reviewed: N/A   DVT Chemoprophylaxis (none if blank):   heparin (porcine) injection 5,000 Units  Reviewed: Yes            Plan     NEUROLOGICAL:   - Neuro exam nonfocal  - OOBTC, ambulate as tolerated  - CAM-ICU Q12H, reorient as needed  - Adhere to normal day/night cycles  - Melatonin PRN  - CPOT < 3 w/  acetaminophen    CARDIOVASCULAR:   #Inferolateral ST elevation myocardial infarction  #S/P PCI w/ 3x DES to mid LCx w/ TIMI 3 revasc  #Coronary artery disease  #Concern for ischemic cardiomyopathy  - Found to have 100% occlusion of mid LCx for which he received 3 DES, was well as 40% LAD stenosis and 20% RCA stenosis     - LVEDP 15, LVEF 50% w/ mid-inferior wall hypokinesis  - No further chest pain at time of my bedside exam  - Post-cath ECG w/ resolution of ST elevations  - Continue to trend hs Troponin-I until peaked and downtrending  - DAPT w/ aspirin and ticagrelor  - High-intensity statin  - Beta blocker on hold at this time given inferior wall component of MI     - May start in AM if BP holding  - Echocardiogram ordered  - Appreciate cardiology recommendations  - BP goal: MAP > 65    PULMONARY:   - Supplemental oxygen as needed to keep sat > 92%  - Incentive spirometer hourly    RENAL:   - No evidence of renal dysfunction  - Monitor BMP daily  - Critical care electrolyte protocol  - I/O goal: allow autoregulation    GASTROINTESTINAL:  - Diet order: cardiac diet  - LFTs wnl  -  Bowel regimen: senna    INFECTIOUS DISEASE:   - No evidence of active infection  - Monitor WBC, fever curve daily  - Continue monitoring off ABX    HEMATOLOGY/ONCOLOGY:   - VTE ppx: heparin sq  - SCDs    ENDOCRINOLOGY/RHEUMATOLOGY:   #Hx type 2 diabetes mellitus  - Check HbA1c  - POCT AC&HS  - Correctional lispro as needed  - BG goal: 100-180    FAMILY UPDATE:   Patient was updated on plan of care at bedside.    This patient is critically ill with life-threatening condition(s) and a high probability of sudden clinically significant deterioration due to the condition(s) noted in the assessment and plan, which requires the highest level of physician/advance practice provider preparedness to intervene urgently. Full attention to the direct care of this patient was provided for the period of time noted below. Any critical care time performed  today is exclusive of teaching and billable procedures and not overlapping with any other physicians or advance practice providers.    I have personally assessed the patient and based my assessment and medical decision-making on a review of the patient's history and 24-hour interval events along with medical records, physical examination, vital signs, analysis of recent laboratory results, evaluation of radiology images, monitoring data for potential decompensation, and additional findings found in detail within ICU team notes. The findings and plan of care was discussed with the care team.    Total critical care time: 58 minutes during this encounter.    Jessee Avers, NP   09/11/2022 10:41 PM

## 2022-09-11 NOTE — ED Provider Notes (Signed)
EMERGENCY DEPARTMENT HISTORY AND PHYSICAL EXAM     None        Date: 09/11/2022  Patient Name: [name] [name]    History of Presenting Illness     No chief complaint on file.          History: [name] [name] is a 66 y.o. male presenting to the ED with severe, acute generalized chest pain that began while patient was walking in the mall.  Patient was seen at outside ED and transferred to this hospital for emergent cardiac catheterization.      PCP: Sioco, Emogene Morgan, MD  SPECIALISTS:    No current facility-administered medications for this encounter.     No current outpatient medications on file.       Past History     Past Medical History:  Past Medical History:   Diagnosis Date    Seasonal allergic rhinitis        Past Surgical History:  Past Surgical History:   Procedure Laterality Date    NOSE SURGERY         Family History:  No family history on file.    Social History:  Social History     Tobacco Use    Smoking status: Never    Smokeless tobacco: Never   Vaping Use    Vaping Use: Never used   Substance Use Topics    Alcohol use: Not Currently    Drug use: Never       Allergies:  No Known Allergies    Review of Systems     Review of Systems: All pertinent systems reviewed and negative.         Physical Exam   BP 102/55   Pulse 72   Temp 98.3 F (36.8 C) (Oral)   Resp 18   Wt 77.9 kg   SpO2 99%   BMI 27.72 kg/m     Constitutional: Vital signs reviewed. Well appearing. No distress.  Head: Normocephalic, atraumatic  Eyes: Conjunctiva and sclera are normal.  No injection or discharge.  Ears, Nose, Throat:  Normal external examination of the nose and ears. No throat or oropharyngeal swelling or erythema. Midline uvula. Mucous membranes moist.  Neck: Normal range of motion. Supple, no meningeal signs. Trachea midline. No stridor. No JVD  Respiratory/Chest: Clear to auscultation. No respiratory distress.   Cardiovascular: Regular rate and rhythm. No murmurs.  Abdomen:    No rebound or guarding. Soft.   Non-tender.  Back:  No focal tenderness.  Upper Extremity:  No edema. No cyanosis. Bilateral radial pulses intact and equal.   Lower Extremity:  No edema. No cyanosis. Bilateral calves symmetrical and non-tender. Bilateral  DP, PT pulses intact and equal.  Skin: Warm and dry. No rash.  Neuro: Cranial nerves grossly intact.  Moves all extremities spontaneously.  Psychiatric: Normal affect.  Normal insight.      Diagnostic Study Results     Labs -     Results       Procedure Component Value Units Date/Time    PT/APTT [[id number]] Collected: 09/11/22 1905     Updated: 09/11/22 1909    CBC and differential [[id number]] Collected: 09/11/22 1843    Specimen: Blood Updated: 09/11/22 1853     WBC 7.95 x10 3/uL      Hgb 13.3 g/dL      Hematocrit 78.2 %      Platelets 231 x10 3/uL      RBC 4.53 x10 6/uL  MCV 89.2 fL      MCH 29.4 pg      MCHC 32.9 g/dL      RDW 13 %      MPV 10.4 fL      Instrument Absolute Neutrophil Count 4.12 x10 3/uL      Neutrophils 51.8 %      Lymphocytes Automated 34.5 %      Monocytes 8.9 %      Eosinophils Automated 4.3 %      Basophils Automated 0.4 %      Immature Granulocytes 0.1 %      Nucleated RBC 0.0 /100 WBC      Neutrophils Absolute 4.12 x10 3/uL      Lymphocytes Absolute Automated 2.74 x10 3/uL      Monocytes Absolute Automated 0.71 x10 3/uL      Eosinophils Absolute Automated 0.34 x10 3/uL      Basophils Absolute Automated 0.03 x10 3/uL      Immature Granulocytes Absolute 0.01 x10 3/uL      Absolute NRBC 0.00 x10 3/uL             Radiologic Studies -   Radiology Results (24 Hour)       ** No results found for the last 24 hours. **        .    Medical Decision Making   I am the first provider for this patient.    I reviewed the vital signs, available nursing notes, medical records, past medical history, past surgical history, family history and social history.    Vital Signs-Reviewed the patient's vital signs.   Patient Vitals for the past 12 hrs:   BP Temp Pulse Resp   09/11/22 1912  102/55 -- 72 18   09/11/22 1902 121/86 -- -- --   09/11/22 1900 121/66 98.3 F (36.8 C) 73 20   09/11/22 1854 156/80 -- -- --         EKG:  Interpreted by me, the EP.   Time Interpreted: 650   Rate: 64   Rhythm: NSR    Interpretation: + ST elevation in lateral and inferiors leads   Comparison:            ED Course:     738 - d/aw Dr. Nehemiah Settle, accepts to cath lab    Provider Notes:    History provided by patient, Dr. Ermelinda Das, EMS.     Patient emergently transferred to [facility] for ST elevation myocardial infarction.  He was emergently transported to cardiac catheterization lab.          CRITICAL CARE: The high probability of sudden, clinically significant deterioration in the patient's condition required the highest level of my preparedness to intervene urgently.    The services I provided to this patient were to treat and/or prevent clinically significant deterioration that could result in: death.  Services included the following: chart data review, reviewing nursing notes and/or old charts, documentation time, consultant collaboration regarding findings and treatment options, medication orders and management, direct patient care, re-evaluations, vital sign assessments and ordering, interpreting and reviewing diagnostic studies/lab tests.    Aggregate critical care time was 35 minutes, which includes only time during which I was engaged in work directly related to the patient's care, as described above, whether at the bedside or elsewhere in the Emergency Department.  It did not include time spent performing other reported procedures or the services of residents, students, nurses or physician assistants.  Diagnosis     Clinical Impression:   1. ST elevation myocardial infarction (STEMI), unspecified artery    2. STEMI (ST elevation myocardial infarction)        Treatment Plan:   ED Disposition       ED Disposition   Send to [facility] Cath Lab    Condition   --    Date/Time   Sat Sep 11, 2022  6:53 PM     Comment   --                 _______________________________    This note was generated by the Epic EMR system/ Dragon speech recognition and may contain inherent errors or omissions not intended by the user. Grammatical errors, random word insertions, deletions and pronoun errors  are occasional consequences of this technology due to software limitations. Not all errors are caught or corrected. If there are questions or concerns about the content of this note or information contained within the body of this dictation they should be addressed directly with the author for clarification.      Attestations: This note is prepared by Lynnea Ferrier, MD    _______________________________       Maryella Shivers, MD  09/27/22 [phone]

## 2022-09-11 NOTE — ED Notes (Signed)
Per Bedside RN Luisa Hart, minimal relief with nitro sublingual x2. Report called to [facility] ED RN Delorise Shiner and [facility] Cath lab RN Megan. Emergency planning/management officer given report and provided with transfer summary and EMTALA. Pt speaks Dari. Staff notified. Patient daughter is contact person Blenda Bridegroom [phone]

## 2022-09-11 NOTE — Procedures (Addendum)
Cardiac cath OP NOTE    Date Time: 09/11/22 8:39 PM    Patient Name:   [name] [name]    Date of Operation:   09/11/2022    Providers Performing:   Surgeon(s):  Nelda Bucks, MD        Operative Procedure:   Selective coronary angiography via right radial artery  LHC / LV gram  DES LCx x 3 ( 3.5 x 18, 3.5 x 18, 3 x 18 onyx Frontier)  IVUS LCx    Preoperative Diagnosis:   Pre-Op Diagnosis Codes:     * STEMI (ST elevation myocardial infarction) [I21.3]    Postoperative Diagnosis:   Post-Op Diagnosis Codes:     * STEMI (ST elevation myocardial infarction) [I21.3]  Inferolateral STEMI  100% occluded LCx    Anesthesia:   Local lidocaine    Estimated Blood Loss:   < 20 mL    History/indication: 66 year old gentleman was walking and developed acute onset of chest pain.  He was presented to the [facility] where electrocardiogram noted acute injury pattern in the inferolateral leads.    Operative note:  STEMI activation 646 PM  MD arrival 708 PM    Patient was emergently taken to the cardiac Cath Lab.  The right wrist was prepped and draped in sterile fashion.  With ultrasound guidance a 6 French sheath was placed in the right radial artery.  A TIG catheter was used to select engage the left main and right coronary artery.  Images were obtained in multiple planes.  A pigtail catheter was used to retrogradely across aortic valve.  LV pressure obtain a left ventricular was performed.    Interventional procedure:  Plan was to proceed with percutaneous coronary invention of the lesion in the mid left circumflex artery.  Anticoagulation consisted of subsequent bolus of heparin with a goal ACT of 300.  Patient received aspirin 324 mg and Brilinta 180 mg prior to arrival to cardiac Cath Lab.  An EBU 3.5 guiding cath was used to select engage the left main.  A short Prowater wire successfully traverse the lesion in the left circumflex artery.  Predilation of lesion occurred with a 2.5 x 12 compliant balloon.   Subsequent to this a 3.5 x 18 Onyx frontier drug-eluting stent was successfully deployed across the mid left circumflex at 11 atm for 20 seconds.  Intravascular ultrasound was performed.  Intravascular ultrasound and repeat coronary angiography confirmed a dissection distal and proximal to the stent that was placed.  This dissection was limited, clinically insignificant, and deemed not a complication.  Subsequent to this a 3 x 18 Onyx frontier drug-eluting stent was placed along the distal edge of the initially placed stent in the left circumflex artery.  The stent was deployed at 11 atm for 20 seconds.  Subsequent to this a 3.5 x 18 Onyx frontier drug-eluting stent was placed along the proximal edge of the first stent that was placed.  This stent was deployed at 11 atm for 20 seconds.  Repeat coronary angiography confirm adequate stent deployment, no evidence of dissection, distal TIMI-3 flow.  There was a 0% post stent residual stenosis    Implants:     Implant Name Type Inv. Item Serial No. Model No. Manufacturer Lot No. LRB No. Used Action   SYSTEM COR STNT RSLT ONYX 3.5MM RX - [id number] Stent SYSTEM COR STNT RSLT ONYX 3.5MM RX  [id number] [employer] [id number]  1 Implanted   STENT COR ONYX FRNTR   LF STRL RX - [id number] Stent STENT COR ONYX FRNTR  LF STRL RX  [id number] [employer] [id number]  1 Implanted   SYSTEM COR STNT RSLT ONYX 3.5MM RX - [id number] Stent SYSTEM COR STNT RSLT ONYX 3.5MM RX  [id number] [employer] [id number]  1 Implanted                Findings:   LM Angiographically normal  LAD 40% mid stenosis  LCx 20% proximal stenosis   100% mid stenosis with distal TIMI 0 flow  RCA 20% mid stenosis  LV LVEF 50%; mid inferior wall hypokinesis; LVEDP 15 mmHg; no significant gradient across aortic valve      Complications:   None    Impression    Inferolateral STEMI due to 100% occluded mid LCx  S/p DES x 3 LCx      Plan    Admit to ICU  DAPT: ASA 81 mg  and brilinta 90 mg bid  High dose statin: lipitor 80 mg qhs  Hold off on BB for now given borderline blood pressure  Check echo  Serial cardiac enzymes          Signed by: Nelda Bucks, MD                                                                           [facility] CARDI[facility] CATH

## 2022-09-11 NOTE — ED Provider Notes (Signed)
EMERGENCY DEPARTMENT NOTE     Patient initially seen and examined at   ED PHYSICIAN ASSIGNED       Date/Time Event User Comments    09/11/22 1843 Physician Assigned Franchot Heidelberg, MD assigned as Attending           ED MIDLEVEL (APP) ASSIGNED       None            HISTORY OF PRESENT ILLNESS       Chief Complaint: No chief complaint on file.       66 y.o. male with past medical history as below presents to the ED with his wife and daughter complaining of chest pain and shortness of breath that started about 2.5 hours PTA.  [place] it [place].  No PMH and takes no medication on a daily basis.      Independent Historian (other than patient): Family (list in HPI)  Additional History Provided by Independent Historian:  MEDICAL HISTORY     Past Medical History:  Past Medical History:   Diagnosis Date    Seasonal allergic rhinitis        Past Surgical History:  Past Surgical History:   Procedure Laterality Date    NOSE SURGERY         Social History:  Social History     Socioeconomic History    Marital status: Married   Occupational History    Occupation: retired     Comment: Engineer, building services   Tobacco Use    Smoking status: Never    Smokeless tobacco: Never   Haematologist Use: Never used   Substance and Sexual Activity    Alcohol use: Not Currently    Drug use: Never   Social History Narrative    Lives with wife, daughter in law and son.     And 2 sons 2 daughter      Social Determinants of Health     Food Insecurity: No Food Insecurity (08/11/2022)    Hunger Vital Sign     Worried About Running Out of Food in the Last Year: Never true     Ran Out of Food in the Last Year: Never true   Intimate Partner Violence: Not At Risk (08/11/2022)    Humiliation, Afraid, Rape, and Kick questionnaire     Fear of Current or Ex-Partner: No     Emotionally Abused: No     Physically Abused: No     Sexually Abused: No       Family History:  No family history on  file.    Outpatient Medication:  Previous Medications    ALBUTEROL (PROVENTIL) (2.5 MG/3ML) 0.083% NEBULIZER SOLUTION    Take 3 mLs (2.5 mg) by nebulization every 4 (four) hours as needed for Wheezing or Shortness of Breath (cough)    BENZONATATE (TESSALON) 100 MG CAPSULE    Take 1 capsule (100 mg) by mouth 3 (three) times daily as needed for Cough    FLUTICASONE (FLONASE) 50 MCG/ACT NASAL SPRAY    2 sprays by Nasal route daily for 7 days    NEBULIZER MISC    Dispense one nebulizer machine to include mask and tubing    VITAMIN D, ERGOCALCIFEROL, (DRISDOL) 50000 UNIT CAP    Take 1 capsule (50,000 Units) by mouth once a week for the next 8 weeks.         REVIEW OF SYSTEMS  Review of Systems See History of Present Illness  PHYSICAL EXAM     ED Triage Vitals   Enc Vitals Group      BP       Pulse       Resp       Temp       Temp src       SpO2       Weight       Height       Head Circumference       Peak Flow       Pain Score       Pain Loc       Pain Edu?       Excl. in GC?      Physical Exam  Vitals and nursing note reviewed.   Constitutional:       General: He is not in acute distress.     Appearance: Normal appearance.   HENT:      Head: Normocephalic and atraumatic.   Cardiovascular:      Rate and Rhythm: Normal rate and regular rhythm.      Heart sounds: Normal heart sounds.   Pulmonary:      Effort: Pulmonary effort is normal.      Breath sounds: Normal breath sounds.   Musculoskeletal:      Right lower leg: No edema.      Left lower leg: No edema.   Skin:     General: Skin is warm and dry.      Capillary Refill: Capillary refill takes less than 2 seconds.   Neurological:      General: No focal deficit present.      Mental Status: He is alert and oriented to person, place, and time.          MEDICAL DECISION MAKING     PRIMARY PROBLEM LIST      Acute illness/injury with risk to life or bodily function (based on differential diagnosis or evaluation) DIAGNOSIS: STEMI     Differential Diagnosis: Chest Pain:  Pneumothorax, Unstable Angina, Pericarditis, Aortic Dissection, Esophageal Perforation, Pleurisy, Musculoskeletal, pulmonary embolism, acute coronary syndrome, myocardial infarction, myocarditis, pericardial effusion, pericardial tamponade    DISCUSSION      Patient with STEMI on ECG.  Discussed with Dr. Katrinka Blazing who accepts to [facility] cath lab and requests brillenta, aspirin and heparin.  Discussed with Dr. Lucianne Muss at [facility] ED.      If patient is being hospitalized is severe sepsis or septic shock suspected?: No infection is suspected and no antibiotics were given    Was management discussed with a consultant?: Yes (explain) interventional cardiology, ED attending    External Records Reviewed?: N/A    Additional Notes          Vital Signs: Reviewed the patient's vital signs.   Nursing Notes: Reviewed and utilized available nursing notes.  Medical Records Reviewed: Reviewed available past medical records.  Counseling: The emergency provider has spoken with the patient and discussed today's findings, in addition to providing specific details for the plan of care.  Questions are answered and there is agreement with the plan.      CARDIAC STUDIES    The following cardiac studies were independently interpreted by me the Emergency Medicine Provider.  For full cardiac study results please see chart.    Monitor Strip interpreted by me (ED provider)  Rate: 60-100  Rhythm: Normal Sinus Rhythm  ST segments: No acute changes    EKG  1 interpreted by me (ED provider)  Time Interpreted: 1841  Rate: 60-100  Rhythm: Normal Sinus Rhythm  ST segments: ST elevations  STEMI?: YES  EKG interpretation: STEMI    EMERGENCY IMAGING STUDIES    The following imagine studies were independently interpreted by me (emergency medicine provider):    RADIOLOGY IMAGING STUDIES      Chest AP Portable    (Results Pending)   Left Heart Cath Poss PCI    (Results Pending)       EMERGENCY DEPT. MEDICATIONS      ED Medication Orders (From admission, onward)      Start  Ordered     Status Ordering Provider    09/11/22 1850 09/11/22 1849  ticagrelor (BRILINTA) tablet 180 mg  Once        Route: Oral  Ordered Dose: 180 mg       Last MAR action: Given Annie Paras    09/11/22 1850 09/11/22 1849  heparin (porcine) injection 4,000 Units  Once        Route: Intravenous  Ordered Dose: 4,000 Units       Last MAR action: Given Annie Paras    09/11/22 1845 09/11/22 1844  aspirin chewable tablet 324 mg  Once        Route: Oral  Ordered Dose: 324 mg       Last MAR action: Given Annie Paras    09/11/22 1845 09/11/22 1844  nitroglycerin (NITROSTAT) SL tablet 0.4 mg  Every 5 min        Route: Sublingual  Ordered Dose: 0.4 mg       Last MAR action: Given Kylea Berrong K            LABORATORY RESULTS    Ordered and independently interpreted AVAILABLE laboratory tests.   Results       Procedure Component Value Units Date/Time    CBC and differential [[id number]] Collected: 09/11/22 1843    Specimen: Blood Updated: 09/11/22 1853     WBC 7.95 x10 3/uL      Hgb 13.3 g/dL      Hematocrit 40.9 %      Platelets 231 x10 3/uL      RBC 4.53 x10 6/uL      MCV 89.2 fL      MCH 29.4 pg      MCHC 32.9 g/dL      RDW 13 %      MPV 10.4 fL      Instrument Absolute Neutrophil Count 4.12 x10 3/uL      Neutrophils 51.8 %      Lymphocytes Automated 34.5 %      Monocytes 8.9 %      Eosinophils Automated 4.3 %      Basophils Automated 0.4 %      Immature Granulocytes 0.1 %      Nucleated RBC 0.0 /100 WBC      Neutrophils Absolute 4.12 x10 3/uL      Lymphocytes Absolute Automated 2.74 x10 3/uL      Monocytes Absolute Automated 0.71 x10 3/uL      Eosinophils Absolute Automated 0.34 x10 3/uL      Basophils Absolute Automated 0.03 x10 3/uL      Immature Granulocytes Absolute 0.01 x10 3/uL      Absolute NRBC 0.00 x10 3/uL     Comprehensive metabolic panel [[id number]] Collected: 09/11/22 1843    Specimen: Blood Updated: 09/11/22 1850    High Sensitivity Troponin-I [[id number]]  Collected: 09/11/22 1843     Specimen: Blood Updated: 09/11/22 1850              CRITICAL CARE/PROCEDURES    Critical Care    Performed by: Annie Paras, MD  Authorized by: Annie Paras, MD    Critical care provider statement:     Critical care time (minutes):  32    Critical care time was exclusive of:  Separately billable procedures and treating other patients    Critical care was necessary to treat or prevent imminent or life-threatening deterioration of the following conditions:  Cardiac failure    Critical care was time spent personally by me on the following activities:  Development of treatment plan with patient or surrogate, discussions with consultants, evaluation of patient's response to treatment, examination of patient, obtaining history from patient or surrogate, ordering and performing treatments and interventions, ordering and review of laboratory studies, ordering and review of radiographic studies, pulse oximetry and re-evaluation of patient's condition    Care discussed with: accepting provider at another facility        DIAGNOSIS      Diagnosis:  Final diagnoses:   ST elevation myocardial infarction (STEMI), unspecified artery       Disposition:  ED Disposition       ED Disposition   Send to [facility] Cath Lab    Condition   --    Date/Time   Sat Sep 11, 2022  6:53 PM    Comment   --               Prescriptions:  Patient's Medications   New Prescriptions    No medications on file   Previous Medications    ALBUTEROL (PROVENTIL) (2.5 MG/3ML) 0.083% NEBULIZER SOLUTION    Take 3 mLs (2.5 mg) by nebulization every 4 (four) hours as needed for Wheezing or Shortness of Breath (cough)    BENZONATATE (TESSALON) 100 MG CAPSULE    Take 1 capsule (100 mg) by mouth 3 (three) times daily as needed for Cough    FLUTICASONE (FLONASE) 50 MCG/ACT NASAL SPRAY    2 sprays by Nasal route daily for 7 days    NEBULIZER MISC    Dispense one nebulizer machine to include mask and tubing    VITAMIN D, ERGOCALCIFEROL, (DRISDOL) 50000 UNIT CAP     Take 1 capsule (50,000 Units) by mouth once a week for the next 8 weeks.   Modified Medications    No medications on file   Discontinued Medications    No medications on file           This note was generated by the Epic EMR system/ Dragon speech recognition and may contain inherent errors or omissions not intended by the user. Grammatical errors, random word insertions, deletions and pronoun errors  are occasional consequences of this technology due to software limitations. Not all errors are caught or corrected. If there are questions or concerns about the content of this note or information contained within the body of this dictation they should be addressed directly with the author for clarification.

## 2022-09-12 ENCOUNTER — Inpatient Hospital Stay (HOSPITAL_COMMUNITY): Payer: No Typology Code available for payment source

## 2022-09-12 DIAGNOSIS — Z9889 Other specified postprocedural states: Secondary | ICD-10-CM

## 2022-09-12 DIAGNOSIS — Z01810 Encounter for preprocedural cardiovascular examination: Secondary | ICD-10-CM

## 2022-09-12 DIAGNOSIS — I081 Rheumatic disorders of both mitral and tricuspid valves: Secondary | ICD-10-CM

## 2022-09-12 LAB — ECHO ADULT TTE COMPLETE
AV Area (Cont Eq VTI): 2.469
AV Area (Cont Eq VTI): 2.47
AV Mean Gradient: 3
AV Peak Velocity: 1.23
BP Mod LV Ejection Fraction: 58.917
IVS Diastolic Thickness (2D): 1.07
LA Dimension (2D): 3.3
LA Volume Index (BP A-L): 20
LVID diastole (2D): 4.64
LVID systole (2D): 3.24
MV Area (PHT): 3.464
MV E/A: 0.8
MV E/A: 0.83
MV E/e' (Average): 8.076
Mitral Valve Findings: NORMAL
Prox Ascending Aorta Diameter: 4.2
RV Basal Diastolic Dimension: 3.45
RV Function: NORMAL
RV Systolic Pressure: 23.612
Site RV Size (AS): NORMAL
TAPSE: 2.14
Tricuspid Valve Findings: NORMAL

## 2022-09-12 LAB — CBC
Absolute NRBC: 0 10*3/uL (ref 0.00–0.00)
Hematocrit: 33.3 % — ABNORMAL LOW (ref 37.6–49.6)
Hgb: 11.3 g/dL — ABNORMAL LOW (ref 12.5–17.1)
MCH: 30.1 pg (ref 25.1–33.5)
MCHC: 33.9 g/dL (ref 31.5–35.8)
MCV: 88.6 fL (ref 78.0–96.0)
MPV: 10.6 fL (ref 8.9–12.5)
Nucleated RBC: 0 /100 WBC (ref 0.0–0.0)
Platelets: 186 10*3/uL (ref 142–346)
RBC: 3.76 10*6/uL — ABNORMAL LOW (ref 4.20–5.90)
RDW: 13 % (ref 11–15)
WBC: 7.8 10*3/uL (ref 3.10–9.50)

## 2022-09-12 LAB — MAGNESIUM: Magnesium: 1.8 mg/dL (ref 1.6–2.6)

## 2022-09-12 LAB — LIPID PANEL
Cholesterol / HDL Ratio: 3.6 Index
Cholesterol: 137 mg/dL (ref 0–199)
HDL: 38 mg/dL — ABNORMAL LOW (ref 40–9999)
LDL Calculated: 88 mg/dL (ref 0–99)
Triglycerides: 55 mg/dL (ref 34–149)
VLDL Calculated: 11 mg/dL (ref 10–40)

## 2022-09-12 LAB — HEPATIC FUNCTION PANEL (LFT)
ALT: 37 U/L (ref 0–55)
AST (SGOT): 211 U/L — ABNORMAL HIGH (ref 5–41)
Albumin/Globulin Ratio: 1.4 (ref 0.9–2.2)
Albumin: 3 g/dL — ABNORMAL LOW (ref 3.5–5.0)
Alkaline Phosphatase: 47 U/L (ref 37–117)
Bilirubin Direct: 0.3 mg/dL (ref 0.0–0.5)
Bilirubin Indirect: 0.3 mg/dL (ref 0.2–1.0)
Bilirubin, Total: 0.6 mg/dL (ref 0.2–1.2)
Globulin: 2.1 g/dL (ref 2.0–3.6)
Protein, Total: 5.1 g/dL — ABNORMAL LOW (ref 6.0–8.3)

## 2022-09-12 LAB — ECG 12-LEAD
Atrial Rate: 64 {beats}/min
Atrial Rate: 63 {beats}/min
IHS MUSE NARRATIVE AND IMPRESSION: NORMAL
IHS MUSE NARRATIVE AND IMPRESSION: NORMAL
P Axis: 68 degrees
P Axis: 64 degrees
P-R Interval: 192 ms
P-R Interval: 198 ms
Q-T Interval: 386 ms
Q-T Interval: 394 ms
QRS Duration: 92 ms
QRS Duration: 80 ms
QTC Calculation (Bezet): 398 ms
QTC Calculation (Bezet): 403 ms
R Axis: 41 degrees
R Axis: -7 degrees
T Axis: 63 degrees
T Axis: 40 degrees
Ventricular Rate: 64 {beats}/min
Ventricular Rate: 63 {beats}/min

## 2022-09-12 LAB — BASIC METABOLIC PANEL
Anion Gap: 6 (ref 5.0–15.0)
BUN: 14 mg/dL (ref 9.0–28.0)
CO2: 20 mEq/L (ref 17–29)
Calcium: 7.6 mg/dL — ABNORMAL LOW (ref 8.5–10.5)
Chloride: 104 mEq/L (ref 99–111)
Creatinine: 0.7 mg/dL (ref 0.5–1.5)
Glucose: 138 mg/dL — ABNORMAL HIGH (ref 70–100)
Potassium: 3.7 mEq/L (ref 3.5–5.3)
Sodium: 130 mEq/L — ABNORMAL LOW (ref 135–145)
eGFR: >60 mL/min/{1.73_m2} (ref >=60)

## 2022-09-12 LAB — HEMOGLOBIN A1C
Average Estimated Glucose: 102.5 mg/dL
Hemoglobin A1C: 5.2 % (ref 4.6–5.6)

## 2022-09-12 LAB — LACTIC ACID: Lactic Acid: 0.7 mmol/L (ref 0.2–2.0)

## 2022-09-12 LAB — CALCIUM, IONIZED: Calcium, Ionized: 2.05 mEq/L — ABNORMAL LOW (ref 2.30–2.58)

## 2022-09-12 LAB — WHOLE BLOOD GLUCOSE POCT
Whole Blood Glucose POCT: 120 mg/dL — ABNORMAL HIGH (ref 70–100)
Whole Blood Glucose POCT: 129 mg/dL — ABNORMAL HIGH (ref 70–100)
Whole Blood Glucose POCT: 108 mg/dL — ABNORMAL HIGH (ref 70–100)
Whole Blood Glucose POCT: 113 mg/dL — ABNORMAL HIGH (ref 70–100)
Whole Blood Glucose POCT: 92 mg/dL (ref 70–100)

## 2022-09-12 LAB — HIGH SENSITIVITY TROPONIN-I
hs Troponin-I: >60000 ng/L — CR
hs Troponin-I: 57061.7 ng/L — CR

## 2022-09-12 LAB — TROPONIN I - RESTRICTED ORDERING: Troponin I: 108.9 ng/mL — ABNORMAL HIGH (ref 0.00–0.03)

## 2022-09-12 LAB — PHOSPHORUS: Phosphorus: 3 mg/dL (ref 2.3–4.7)

## 2022-09-12 MED ORDER — LACTATED RINGERS IV BOLUS
500.0000 mL | Freq: Once | INTRAVENOUS | Status: AC
Start: 2022-09-12 — End: 2022-09-12
  Administered 2022-09-12: 500 mL via INTRAVENOUS

## 2022-09-12 MED ORDER — SODIUM CHLORIDE 0.9 % IV BOLUS
250.0000 mL | Freq: Once | INTRAVENOUS | Status: AC
Start: 2022-09-12 — End: 2022-09-12
  Administered 2022-09-12: 250 mL via INTRAVENOUS

## 2022-09-12 MED ORDER — CALCIUM CARBONATE ANTACID 500 MG PO CHEW
500.0000 mg | CHEWABLE_TABLET | Freq: Four times a day (QID) | ORAL | Status: DC | PRN
Start: 2022-09-12 — End: 2022-09-14

## 2022-09-12 MED ORDER — POTASSIUM CHLORIDE CRYS ER 20 MEQ PO TBCR
40.0000 meq | EXTENDED_RELEASE_TABLET | Freq: Once | ORAL | Status: AC
Start: 2022-09-12 — End: 2022-09-12
  Administered 2022-09-12: 40 meq via ORAL
  Filled 2022-09-12: qty 2

## 2022-09-12 MED ORDER — CALCIUM GLUCONATE-NACL 1-0.675 GM/50ML-% IV SOLN
1.0000 g | INTRAVENOUS | Status: AC
Start: 2022-09-12 — End: 2022-09-12
  Administered 2022-09-12 (×2): 1 g via INTRAVENOUS
  Filled 2022-09-12 (×2): qty 50

## 2022-09-12 MED ORDER — MAGNESIUM SULFATE IN D5W 1-5 GM/100ML-% IV SOLN
1.0000 g | INTRAVENOUS | Status: AC
Start: 2022-09-12 — End: 2022-09-12
  Administered 2022-09-12 (×2): 1 g via INTRAVENOUS
  Filled 2022-09-12 (×2): qty 100

## 2022-09-12 MED ORDER — SODIUM CHLORIDE 0.9 % IV BOLUS
250.0000 mL | Freq: Once | INTRAVENOUS | Status: DC
Start: 2022-09-12 — End: 2022-09-12

## 2022-09-12 NOTE — Plan of Care (Addendum)
Patient is A/Ox4, able to move all extremities, follows commands. [name] [name], needs interpreter for in depth discussion.  SR 50's-70's, SBP 80's-90's. MAP 60's-70's. 500cc bolus given.   On room air, maintaining Sats >95%  Walks to bathroom, 2 BMs during shift.   ECHO completed, EF 59%    Problem: Moderate/High Fall Risk Score >5  Goal: Patient will remain free of falls  09/12/2022 1027 by Truddie Hidden, RN  Outcome: Progressing  Flowsheets (Taken 09/12/2022 1027)  Moderate Risk (6-13):   LOW-Fall Interventions Appropriate for Low Fall Risk   LOW-Anticoagulation education for injury risk  09/12/2022 0833 by Truddie Hidden, RN  Outcome: Progressing     Problem: Compromised Activity/Mobility  Goal: Activity/Mobility Interventions  09/12/2022 1027 by Truddie Hidden, RN  Outcome: Progressing  Flowsheets (Taken 09/12/2022 0800)  Activity/Mobility Interventions: Pad bony prominences, TAP Seated positioning system when OOB, Promote PMP, Reposition q 2 hrs / turn clock, Offload heels (boots or pillows)  09/12/2022 0833 by Truddie Hidden, RN  Outcome: Progressing     Problem: Compromised Nutrition  Goal: Nutrition Interventions  09/12/2022 1027 by Truddie Hidden, RN  Outcome: Progressing  Flowsheets (Taken 09/12/2022 0800)  Nutrition Interventions: Discuss nutrition at MDR, I&Os document % meal eaten, Daily weights  09/12/2022 0833 by Truddie Hidden, RN  Outcome: Progressing     Problem: Pain interferes with ability to perform ADL  Goal: Pain at adequate level as identified by patient  09/12/2022 1027 by Persis Graffius, Jones Broom, RN  Outcome: Progressing  Flowsheets (Taken 09/12/2022 1027)  Pain at adequate level as identified by patient:   Identify patient comfort function goal   Assess pain on admission, during daily assessment and/or before any "as needed" intervention(s)   Reassess pain within 30-60 minutes of any procedure/intervention, per Pain Assessment, Intervention, Reassessment (AIR) Cycle   Assess for risk of  opioid induced respiratory depression, including snoring/sleep apnea. Alert healthcare team of risk factors identified.   Evaluate if patient comfort function goal is met   Offer non-pharmacological pain management interventions   Evaluate patient's satisfaction with pain management progress  09/12/2022 0833 by Truddie Hidden, RN  Outcome: Progressing     Problem: Side Effects from Pain Analgesia  Goal: Patient will experience minimal side effects of analgesic therapy  09/12/2022 1027 by Truddie Hidden, RN  Outcome: Progressing  Flowsheets (Taken 09/12/2022 1027)  Patient will experience minimal side effects of analgesic therapy: Monitor/assess patient's respiratory status (RR depth, effort, breath sounds)  09/12/2022 0833 by Truddie Hidden, RN  Outcome: Progressing     Problem: Hemodynamic Status: Cardiac  Goal: Stable vital signs and fluid balance  09/12/2022 1027 by Truddie Hidden, RN  Outcome: Progressing  Flowsheets (Taken 09/12/2022 1027)  Stable vital signs and fluid balance:   Assess signs and symptoms associated with cardiac rhythm changes   Monitor lab values  09/12/2022 0833 by Truddie Hidden, RN  Outcome: Progressing     Problem: Inadequate Tissue Perfusion  Goal: Adequate tissue perfusion will be maintained  09/12/2022 1027 by Truddie Hidden, RN  Outcome: Progressing  Flowsheets (Taken 09/12/2022 1027)  Adequate tissue perfusion will be maintained:   Monitor/assess lab values and report abnormal values   Monitor/assess neurovascular status (pulses, capillary refill, pain, paresthesia, paralysis, presence of edema)   Monitor/assess for signs of VTE (edema of calf/thigh redness, pain)   Monitor for signs and symptoms of a pulmonary embolism (dyspnea, tachypnea, tachycardia, confusion)   Encourage/assist patient  as needed to turn, cough, and perform deep breathing every 2 hours  09/12/2022 0833 by Truddie Hidden, RN  Outcome: Progressing     Problem: Fluid and Electrolyte Imbalance/ Endocrine  Goal:  Fluid and electrolyte balance are achieved/maintained  09/12/2022 1027 by Truddie Hidden, RN  Outcome: Progressing  Flowsheets (Taken 09/12/2022 1027)  Fluid and electrolyte balance are achieved/maintained:   Monitor/assess lab values and report abnormal values   Assess and reassess fluid and electrolyte status   Observe for cardiac arrhythmias   Monitor for muscle weakness  09/12/2022 0833 by Truddie Hidden, RN  Outcome: Progressing  Goal: Adequate hydration  09/12/2022 1027 by Truddie Hidden, RN  Outcome: Progressing  Flowsheets (Taken 09/12/2022 1027)  Adequate hydration:   Assess mucus membranes, skin color, turgor, perfusion and presence of edema   Monitor and assess vital signs and perfusion   Assess for peripheral, sacral, periorbital and abdominal edema  09/12/2022 0833 by Truddie Hidden, RN  Outcome: Progressing     Problem: Nutrition  Goal: Nutritional intake is adequate  09/12/2022 1027 by Truddie Hidden, RN  Outcome: Progressing  09/12/2022 0833 by Truddie Hidden, RN  Outcome: Progressing  Goal: Patient maintains weight  09/12/2022 1027 by Truddie Hidden, RN  Outcome: Progressing  09/12/2022 0833 by Truddie Hidden, RN  Outcome: Progressing  Goal: Food and/or nutrient delivery  09/12/2022 1027 by Truddie Hidden, RN  Outcome: Progressing  09/12/2022 0833 by Truddie Hidden, RN  Outcome: Progressing

## 2022-09-12 NOTE — UM Notes (Addendum)
[facility]  [address]  [place], [place] [id number]  NPI # [id number]  Tax ID [id number]  Please call [phone] with any questions or concerns - Confidential Voice Mail  Fax final authorization and request for additional information to [phone]    IP Admission Rev 12/30 and day 2 12/31  Auth     Ordered   Start   09/11/22 2034  ADMIT TO INPATIENT  Once        Diagnosis: Stemi (St Elevation Myocardial Infarction)  Level of Care: ICU  Patient Class: Inpatient            PATIENT NAME: [name] [name]   DOB: June 24, 1956       Reason For Hospitalization:   Pt is a 66 y.o. male who arrived to the ER with C/O CP and SOB   66 y.o. male with past medical history as below presents to the ED with his wife and daughter complaining of chest pain and shortness of breath that started about 2.5 hours PTA.  [place]     IN ED: + CP  Code STEMI in ER    PMH:  has a past medical history of Seasonal allergic rhinitis.    Vitals:  97.9 HR 60-73 RR 21-29 BP 89/56 Sat 95-98% wt 77 kg    EKG:  NORMAL SINUS RHYTHM  SEPTAL INFARCT , AGE UNDETERMINED  INFEROLATERAL INJURY PATTERN  ACUTE MI / STEMI   ABNORMAL ECG  NO PREVIOUS ECGS AVAILABLE    Abnormal Labs: ACT Kaolin 217 Glucose 120 H/H 11/33 Trop >60,000 Ca ionized 2.05    Urine:      Radiologic Exams:  No results found.     ED meds:  NTG SL x2      Admit to Inpatient on 12/30       Scheduled Meds:  Current Facility-Administered Medications   Medication Dose Route Frequency    aspirin EC  81 mg Oral Daily    atorvastatin  80 mg Oral QHS    heparin (porcine)  5,000 Units Subcutaneous Q8H SCH    insulin lispro  1-3 Units Subcutaneous QHS    insulin lispro  1-5 Units Subcutaneous TID AC    senna-docusate  1 tablet [facility] Q12H [facility]    ticagrelor  90 mg Oral Q12H     Continuous Infusions:  PRN Meds:acetaminophen, calcium carbonate, calcium GLUConate, dextrose **OR** dextrose **OR** dextrose **OR** glucagon (rDNA), magnesium  oxide **OR** magnesium sulfate, melatonin, potassium chloride **OR** potassium chloride **OR** potassium chloride, sodium phosphates 15 mmol in dextrose 5 % 250 mL IVPB, sodium phosphates 25 mmol in dextrose 5 % 250 mL IVPB, sodium phosphates 35 mmol in dextrose 5 % 250 mL IVPB    Testing/Procedures:   TO LEFT HEART CATH FROM ER    Cardiac cath OP NOTE     Date Time: 09/11/22 8:39 PM     Patient Name:   [name] [name]     Date of Operation:   09/11/2022     Providers Performing:   Surgeon(s):  Nelda BucksSmith, Rahsaan C, MD           Operative Procedure:   Selective coronary angiography via right radial artery  LHC / LV gram  DES LCx x 3 ( 3.5 x 18, 3.5 x 18, 3 x 18 onyx Frontier)  IVUS LCx     Preoperative Diagnosis:   Pre-Op Diagnosis Codes:     * STEMI (ST elevation myocardial  infarction) [I21.3]     Postoperative Diagnosis:   Post-Op Diagnosis Codes:     * STEMI (ST elevation myocardial infarction) [I21.3]  Inferolateral STEMI  100% occluded LCx     Anesthesia:   Local lidocaine     Estimated Blood Loss:   < 20 mL     History/indication: 66 year old gentleman was walking and developed acute onset of chest pain.  He was presented to the [facility] where electrocardiogram noted acute injury pattern in the inferolateral leads.     Operative note:  STEMI activation 646 PM  MD arrival 708 PM    Findings:   LM       Angiographically normal  LAD     40% mid stenosis  LCx      20% proximal stenosis              100% mid stenosis with distal TIMI 0 flow  RCA     20% mid stenosis  LV        LVEF 50%; mid inferior wall hypokinesis; LVEDP 15 mmHg; no significant gradient across aortic valve        Complications:   None     Impression     Inferolateral STEMI due to 100% occluded mid LCx  S/p DES x 3 LCx        Plan     Admit to ICU  DAPT: ASA 81 mg and brilinta 90 mg bid  High dose statin: lipitor 80 mg qhs  Hold off on BB for now given borderline blood pressure  Check echo  Serial cardiac enzymes    MD Assessment and  Plan:  Assessment & Plan   MSICU Attending Assessment/Plan:  66 year old male with a history of diabetes who presented to the [facility] ED with acute chest pain and found to have ST elevations in the inferior lateral leads and was transferred to [facility] Cath Lab for STEMI.  He was found to have 100% occlusion in the mid left circumflex and is now status post placement of 3 drug-eluting stents to the circumflex with TIMI 3 revascularization flow.      Assessment:  STEMI - inferolateral   Possible ischemic cardiomyopathy  Coronary Artery Disease  Hyperlipidemia   Diabetes Mellitus     Plan:  Admit to ICU  Dual antiplatelet therapy  High-dose high intensity statin  Echo  Serial cardiac enzymes  Goal glucose between 140 and 180  Check hemoglobin A1c          Cardio Consult:  ASSESSMENT & PLAN:      Impression     Inferolateral STEMI due to 100% occluded mid LCx  S/p DES x 3 LCx        Plan     Admit to ICU  DAPT: ASA 81 mg and brilinta 90 mg bid  High dose statin: lipitor 80 mg qhs  Hold off on BB for now given borderline blood pressure  Check echo  Serial cardiac enzymes    =============pt is admitted to  ICU and remains 12/31 STEMI with left heart cath with stenting POD 1 Hypotension  ECHO results pending     VS: 98.4 HR 59-69 RR 22-24 BP 85/53 89/55  Sat 96-98%     Labs: AST 211 Albumin 3.0 Trop 108.90 Glucose 138 NA 130 Ca 7.6 HDL 38    MD assessment and plan:  Plan   NEUROLOGICAL:   Delirium precautions including maintenance of day/night wake cycles, frequent reorientation as needed  CARDIOVASCULAR:   #Acute inferior STEMI  #CAD  #Hypotension  -Presented with acute inferior STEMI and s/p 3 DES to LCx  -Reports chest pain free this morning - morning EKG reviewed with resolution of ST changes  -ASA/brilinta  -Atorvastatin  -Borderline BP with MAP hovering ~65, suspect he may be somewhat preload dependant, gentle IVF as needed.   -TTE pending  -No signs of end organ hypoperfusion - normal lactate, renal function, mentation.  Warm and well perfused on exam  -Hold BB given borderline BP  -Check A1C, lipid profile     PULMONARY:   -Comfortable on room air     RENAL:   -Cr stable at 0.7     GASTROINTESTINAL:  At risk for constipation  Nutrition: Cardiac diet  Bowel Regimen  GI ppx:      INFECTIOUS DISEASE:   -No evidence of active infection     HEMATOLOGY/ONCOLOGY:   - VTE ppx: Heparin subQ    Cardio Consults:    ASSESSMENT & PLAN:      Impression     Inferolateral STEMI due to 100% occluded mid LCx  S/p DES x 3 LCx     Plan  DAPT: ASA 81 mg and brilinta 90 mg bid  High dose statin: lipitor 80 mg qhs  Hold off on BB for now given borderline blood pressure  Check echo    Additional Orders:  Scheduled Meds:  Current Facility-Administered Medications   Medication Dose Route Frequency    aspirin EC  81 mg Oral Daily    atorvastatin  80 mg Oral QHS    heparin (porcine)  5,000 Units Subcutaneous Q8H SCH    insulin lispro  1-3 Units Subcutaneous QHS    insulin lispro  1-5 Units Subcutaneous TID AC    senna-docusate  1 tablet Oral Q12H [facility]    ticagrelor  90 mg Oral Q12H       PRN Meds:.  Atropine 1 mg IV x1   Tylenol PO x1      This clinical review is based on/compiled from documentation provided by the treatment team within the patient's medical record.  ________________________________________

## 2022-09-12 NOTE — Progress Notes (Signed)
CARDIOLOGY HOSPITAL NOTE  Nelda Bucks, MD   [facility]  Office phone:  [phone]     [facility] phone [phone]  [facility] phone [phone]    Date:  09/12/2022  9:45 AM    Patient:  [name] [name].  DOB: 09-Jun-1956.  66 y.o.  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  S/p DES x 3 LCx        Plan       DAPT: ASA 81 mg and brilinta 90 mg bid  High dose statin: lipitor 80 mg qhs  Hold off on BB for now given borderline blood pressure  Check echo      subjective     No chest pain      MEDICATIONS:    INFUSION MEDS:      SCHEDULED MEDS:     Current Facility-Administered Medications   Medication Dose Route Frequency    aspirin EC  81 mg Oral Daily    atorvastatin  80 mg Oral QHS    heparin (porcine)  5,000 Units Subcutaneous Q8H SCH    insulin lispro  1-3 Units Subcutaneous QHS    insulin lispro  1-5 Units Subcutaneous TID AC    lactated ringers  500 mL Intravenous Once    senna-docusate  1 tablet Oral Q12H [facility]    ticagrelor  90 mg Oral Q12H      PRN MEDS:   acetaminophen, calcium carbonate, calcium GLUConate, dextrose **OR** dextrose **OR** dextrose **OR** glucagon (rDNA), magnesium oxide **OR** magnesium sulfate, melatonin, potassium chloride **OR** potassium chloride **OR** potassium chloride, sodium phosphates 15 mmol in dextrose 5 % 250 mL IVPB, sodium phosphates 25 mmol in dextrose 5 % 250 mL IVPB, sodium phosphates 35 mmol in dextrose 5 % 250 mL IVPB         LABS:     Recent Labs   Lab 09/12/22  0300 09/11/22  1843   WBC 7.80 7.95   Hgb 11.3* 13.3   Hematocrit 33.3* 40.4   Platelets 186 231     Recent Labs   Lab 09/12/22  0426 09/11/22  1843   Sodium 130* 136   Potassium 3.7 3.6   Chloride 104 101   CO2 20 25   BUN 14.0 14.0   Creatinine 0.7 0.8   eGFR >60.0 >60.0   Glucose 138* 144*   Calcium 7.6* 9.2     Recent Labs   Lab  09/12/22  0300 09/11/22  1843   Troponin I 108.90*  --    hs Troponin-I >60,000.0* 10.7            FLUID BALANCE AND WEIGHT:                                           Last filed weight: Weight: 77.9 kg (171 lb 11.8 oz)  Weight on Admission: Weight: 77.9 kg (171 lb 11.8 oz)  Net IO Since Admission: 989.46 mL [09/12/22 0945]     Intake/Output Summary (Last 24 hours) at 09/12/2022 0700  Last data filed at 09/12/2022 0700  Gross per 24 hour   Intake 989.46 ml   Output --   Net 989.46 ml  Weight change:     Last 3 Weights for the past 72 hrs (Last 3 readings):   Weight   09/12/22 0356 77.9 kg (171 lb 11.8 oz)        PHYSICAL EXAM:                                                                 BP (!) 89/55   Pulse 69   Temp 98.4 F (36.9 C) (Oral)   Resp (!) 24   Ht 1.676 m (5\' 6" )   Wt 77.9 kg (171 lb 11.8 oz)   SpO2 97%   BMI 27.72 kg/m   Physical Examination: General appearance - alert, well appearing, and in no distress  Chest - clear to auscultation, no wheezes, rales or rhonchi, symmetric air entry  Heart - normal rate and regular rhythm, S1 and S2 normal  Abdomen - nontender  Extremities - no pedal edema noted

## 2022-09-12 NOTE — Progress Notes (Signed)
FOUR EYES SKIN ASSESSMENT NOTE    [name] [name]  [birth year]  [id number]  Braden Scale Score: 19            ICU ONLY: Was HAPI Intervention list reviewed with incoming RN/PCT? No      Bony Prominences: Check appropriate box; if wound is present enter wound assessment in LDA     Occiput:                 [x] WNL  []  Wound present  Face:                     [x] WNL  []  Wound present  Ears:                      [x] WNL  []  Wound present  Spine:                    [x] WNL  []  Wound present  Shoulders:             [x] WNL  []  Wound present  Elbows:                  [x] WNL  []  Wound present  Sacrum/coccyx:     [x] WNL  []  Wound present  Ischial Tuberosity:  [x] WNL  []  Wound present  Trochanter/Hip:      [x] WNL  []  Wound present  Knees:                   [x] WNL  []  Wound present  Ankles:                   [x] WNL  []  Wound present  Heels:                    [x] WNL  []  Wound present  Other pressure areas:  []  Wound location       Device related: []  Device name:           Other skin related issues, ie tears, rash, etc, document in Integumentary flowsheet    If Wound/Pressure Injury present:  Wound/PI assessment documented in LDA (will be shown below): No    Admitting physician notified: No    Wound consult ordered: No      Albin Felling, RN  September 12, 2022  4:47 AM    Second RN/PCT Name: Gerlene Burdock, [place]

## 2022-09-12 NOTE — Plan of Care (Signed)
Pt awake to drowsy, ambulates to the bathroom, NS bolus X1 given for hypotension, pt asymptomatic. BM X2 through shift. Ambulated with no c/o chest pain.     Problem: Moderate/High Fall Risk Score >5  Goal: Patient will remain free of falls  Outcome: Progressing  Flowsheets (Taken 09/12/2022 2200)  Moderate Risk (6-13): LOW-Fall Interventions Appropriate for Low Fall Risk     Problem: Compromised Activity/Mobility  Goal: Activity/Mobility Interventions  Outcome: Progressing  Flowsheets (Taken 09/12/2022 2000)  Activity/Mobility Interventions: Pad bony prominences, TAP Seated positioning system when OOB, Promote PMP, Reposition q 2 hrs / turn clock, Offload heels (boots or pillows)     Problem: Compromised Nutrition  Goal: Nutrition Interventions  Outcome: Progressing  Flowsheets (Taken 09/12/2022 2000)  Nutrition Interventions: Discuss nutrition at MDR, I&Os document % meal eaten, Daily weights     Problem: Hemodynamic Status: Cardiac  Goal: Stable vital signs and fluid balance  Outcome: Progressing  Flowsheets (Taken 09/12/2022 1027 by Truddie Hidden, RN)  Stable vital signs and fluid balance:   Assess signs and symptoms associated with cardiac rhythm changes   Monitor lab values     Problem: Inadequate Tissue Perfusion  Goal: Adequate tissue perfusion will be maintained  Outcome: Progressing  Flowsheets (Taken 09/12/2022 1027 by Truddie Hidden, RN)  Adequate tissue perfusion will be maintained:   Monitor/assess lab values and report abnormal values   Monitor/assess neurovascular status (pulses, capillary refill, pain, paresthesia, paralysis, presence of edema)   Monitor/assess for signs of VTE (edema of calf/thigh redness, pain)   Monitor for signs and symptoms of a pulmonary embolism (dyspnea, tachypnea, tachycardia, confusion)   Encourage/assist patient as needed to turn, cough, and perform deep breathing every 2 hours     Problem: Fluid and Electrolyte Imbalance/ Endocrine  Goal: Fluid and electrolyte  balance are achieved/maintained  Outcome: Progressing  Flowsheets (Taken 09/12/2022 1027 by Truddie Hidden, RN)  Fluid and electrolyte balance are achieved/maintained:   Monitor/assess lab values and report abnormal values   Assess and reassess fluid and electrolyte status   Observe for cardiac arrhythmias   Monitor for muscle weakness  Goal: Adequate hydration  Outcome: Progressing  Flowsheets (Taken 09/12/2022 1027 by Truddie Hidden, RN)  Adequate hydration:   Assess mucus membranes, skin color, turgor, perfusion and presence of edema   Monitor and assess vital signs and perfusion   Assess for peripheral, sacral, periorbital and abdominal edema     Problem: Nutrition  Goal: Patient maintains weight  Outcome: Progressing

## 2022-09-12 NOTE — Treatment Plan (Cosign Needed)
FOUR EYES SKIN ASSESSMENT NOTE    [name] [name]  [birth year]  [id number]  Braden Scale Score: 19            ICU ONLY: Was HAPI Intervention list reviewed with incoming RN/PCT? Yes    Bony Prominences: Check appropriate box; if wound is present enter wound assessment in LDA     Occiput:                 [x] WNL  []  Wound present  Face:                     [x] WNL  []  Wound present  Ears:                      [x] WNL  []  Wound present  Spine:                    [x] WNL  []  Wound present  Shoulders:             [x] WNL  []  Wound present  Elbows:                  [x] WNL  []  Wound present  Sacrum/coccyx:     [x] WNL  []  Wound present  Ischial Tuberosity:  [x] WNL  []  Wound present  Trochanter/Hip:      [x] WNL  []  Wound present  Knees:                   [x] WNL  []  Wound present  Ankles:                   [x] WNL  []  Wound present  Heels:                    [x] WNL  []  Wound present  Other pressure areas:  []  Wound location       Device related: []  Device name:           Other skin related issues, ie tears, rash, etc, document in Integumentary flowsheet    If Wound/Pressure Injury present:  Wound/PI assessment documented in LDA (will be shown below): No    Admitting physician notified: No    Wound consult ordered: No      , RN  September 12, 2022  5:40 PM    Second RN/PCT Name:

## 2022-09-12 NOTE — Progress Notes (Signed)
NURSING SHIFT NOTE     Patient: [name] [name]  Day: 1      SHIFT EVENTS     Shift Narrative/Significant Events (PRN med administration, fall, RRT, etc.):     Assumed patient care at approximately 2100. TR band removed at 0100, no signs of bleeding. LR and of NS given for hypotension, patient was asymptomatic NP Shanda Bumps assessed patient at bedside. Episodes of V.tach  Electrolytes replaced   Safety and fall precautions remain in place. Purposeful rounding completed.          ASSESSMENT     Changes in assessment from patient's baseline this shift:    Neuro: No  CV: Yes bolus X2 given for hypotension, HR 50s to 70s  Pulm: No  Peripheral Vascular: No  HEENT: No  GI: No  BM during shift: Yes   , Last BM:  09/12/2022  GU: No   Integ: No  MS: No    Pain: Improved  Pain Interventions: Medications and Rest  Medications Utilized:     Mobility: PMP Activity: Step 3 - Bed Mobility of             Lines     Patient Lines/Drains/Airways Status       Active Lines, Drains and Airways       Name Placement date Placement time Site Days    Peripheral IV 09/11/22 18 G Right Antecubital 09/11/22  1900  Antecubital  less than 1    Peripheral IV 09/11/22 18 G Anterior;Distal;Left Antecubital 09/11/22  1900  Antecubital  less than 1                         VITAL SIGNS     Vitals:    09/12/22 0400   BP: (!) 76/46   Pulse: 62   Resp: (!) 23   Temp:    SpO2: 96%       Temp  Min: 97.9 F (36.6 C)  Max: 98.3 F (36.8 C)  Pulse  Min: 59  Max: 73  Resp  Min: 18  Max: 29  BP  Min: 76/46  Max: 156/80  SpO2  Min: 95 %  Max: 99 %      Intake/Output Summary (Last 24 hours) at 09/12/2022 0454  Last data filed at 09/12/2022 0400  Gross per 24 hour   Intake 693.25 ml   Output --   Net 693.25 ml

## 2022-09-12 NOTE — Plan of Care (Signed)
Problem: Moderate/High Fall Risk Score >5  Goal: Patient will remain free of falls  09/12/2022 0319 by Albin Felling, RN  Outcome: Progressing  Flowsheets (Taken 09/11/2022 2200)  Moderate Risk (6-13):   MOD-Apply bed exit alarm if patient is confused   MOD-Consider activation of bed alarm if appropriate   MOD-Floor mat at bedside (where available) if appropriate   MOD-Place bedside commode and assistive devices out of sight when not in use  09/12/2022 0317 by Albin Felling, RN  Outcome: Progressing  Flowsheets (Taken 09/11/2022 2200)  Moderate Risk (6-13):   MOD-Apply bed exit alarm if patient is confused   MOD-Consider activation of bed alarm if appropriate   MOD-Floor mat at bedside (where available) if appropriate   MOD-Place bedside commode and assistive devices out of sight when not in use     Problem: Compromised Activity/Mobility  Goal: Activity/Mobility Interventions  09/12/2022 0319 by Albin Felling, RN  Outcome: Progressing  Flowsheets (Taken 09/11/2022 2100)  Activity/Mobility Interventions: Pad bony prominences, TAP Seated positioning system when OOB, Promote PMP, Reposition q 2 hrs / turn clock, Offload heels (boots or pillows)  09/12/2022 0317 by Albin Felling, RN  Outcome: Progressing  Flowsheets (Taken 09/11/2022 2100)  Activity/Mobility Interventions: Pad bony prominences, TAP Seated positioning system when OOB, Promote PMP, Reposition q 2 hrs / turn clock, Offload heels (boots or pillows)     Problem: Compromised Nutrition  Goal: Nutrition Interventions  09/12/2022 0319 by Albin Felling, RN  Outcome: Progressing  Flowsheets (Taken 09/11/2022 2100)  Nutrition Interventions: Discuss nutrition at MDR, I&Os document % meal eaten, Daily weights  09/12/2022 0317 by Albin Felling, RN  Outcome: Progressing  Flowsheets (Taken 09/11/2022 2100)  Nutrition Interventions: Discuss nutrition at MDR, I&Os document % meal eaten, Daily weights     Problem: Pain interferes with ability to  perform ADL  Goal: Pain at adequate level as identified by patient  Outcome: Progressing  Flowsheets (Taken 09/11/2022 2100)  Pain at adequate level as identified by patient:   Identify patient comfort function goal   Assess pain on admission, during daily assessment and/or before any "as needed" intervention(s)   Offer non-pharmacological pain management interventions     Problem: Side Effects from Pain Analgesia  Goal: Patient will experience minimal side effects of analgesic therapy  09/12/2022 0319 by Albin Felling, RN  Outcome: Progressing  Flowsheets (Taken 09/12/2022 [phone])  Patient will experience minimal side effects of analgesic therapy: Monitor/assess patient's respiratory status (RR depth, effort, breath sounds)  09/12/2022 0317 by Albin Felling, RN  Outcome: Progressing  Flowsheets (Taken 09/12/2022 [phone])  Patient will experience minimal side effects of analgesic therapy: Monitor/assess patient's respiratory status (RR depth, effort, breath sounds)     Problem: Hemodynamic Status: Cardiac  Goal: Stable vital signs and fluid balance  09/12/2022 0319 by Albin Felling, RN  Outcome: Progressing  Flowsheets (Taken 09/12/2022 0317)  Stable vital signs and fluid balance: Assess signs and symptoms associated with cardiac rhythm changes  09/12/2022 0317 by Albin Felling, RN  Outcome: Progressing  Flowsheets (Taken 09/12/2022 0317)  Stable vital signs and fluid balance: Assess signs and symptoms associated with cardiac rhythm changes     Problem: Inadequate Tissue Perfusion  Goal: Adequate tissue perfusion will be maintained  Outcome: Progressing  Flowsheets (Taken 09/12/2022 0317)  Adequate tissue perfusion will be maintained:   Monitor/assess lab values and report abnormal values   Monitor/assess neurovascular status (pulses, capillary refill, pain, paresthesia, paralysis, presence  of edema)   Monitor/assess for signs of VTE (edema of calf/thigh redness, pain)     Problem: Fluid and Electrolyte  Imbalance/ Endocrine  Goal: Fluid and electrolyte balance are achieved/maintained  09/12/2022 0319 by Albin Felling, RN  Outcome: Progressing  Flowsheets (Taken 09/12/2022 [phone])  Fluid and electrolyte balance are achieved/maintained:   Observe for cardiac arrhythmias   Monitor/assess lab values and report abnormal values   Assess and reassess fluid and electrolyte status   Monitor for muscle weakness  09/12/2022 0317 by Albin Felling, RN  Outcome: Progressing  Flowsheets (Taken 09/12/2022 [phone])  Fluid and electrolyte balance are achieved/maintained:   Observe for cardiac arrhythmias   Monitor/assess lab values and report abnormal values   Assess and reassess fluid and electrolyte status   Monitor for muscle weakness  Goal: Adequate hydration  09/12/2022 0319 by Albin Felling, RN  Outcome: Progressing  Flowsheets (Taken 09/12/2022 [phone])  Adequate hydration:   Assess mucus membranes, skin color, turgor, perfusion and presence of edema   Monitor and assess vital signs and perfusion  09/12/2022 0317 by Albin Felling, RN  Outcome: Progressing  Flowsheets (Taken 09/12/2022 0317)  Adequate hydration:   Assess mucus membranes, skin color, turgor, perfusion and presence of edema   Monitor and assess vital signs and perfusion     Problem: Nutrition  Goal: Nutritional intake is adequate  09/12/2022 0319 by Albin Felling, RN  Outcome: Progressing  09/12/2022 0317 by Albin Felling, RN  Outcome: Progressing  Flowsheets (Taken 09/12/2022 [phone])  Nutritional intake is adequate: Consult/collaborate with Speech Therapy (swallow evaluations)

## 2022-09-12 NOTE — Progress Notes (Signed)
[place] ICU  Progress Note      Patient Name: [name] [name]  MRN: [id number]  Room: A2907/A2907-01    Reason for Admission: ST elevation myocardial infarction (STEMI), unspecified artery  ICU Day: 11h      Overview   [name] [name] is a 66 y.o. male who presents with acute inferior STEMI now s/p 3 DES to circumflex    Subjective   Hospital Course:  12/30: Admitted inferior STEMI, 3 DES to Cx     Objective   Physical Examination     Last vent settings if on vent:       Vitals Temp:  [97.9 F (36.6 C)-98.4 F (36.9 C)]   Heart Rate:  [59-73]   Resp Rate:  [18-33]   BP: (75-156)/(46-86)   SpO2:  [95 %-99 %]   Height:  [167.6 cm (5\' 6" )]   Weight:  [77.9 kg (171 lb 11.8 oz)]   BMI (calculated):  [27.8]  // Temperature with 24 range    Input / Output:   Intake/Output Summary (Last 24 hours) at 09/12/2022 6213  Last data filed at 09/12/2022 0700  Gross per 24 hour   Intake 989.46 ml   Output --   Net 989.46 ml       General:     Neuro:    Non-focal neuro exam alert & oriented x 3     Lungs:   clear to auscultation, no wheezes, rales or rhonchi, symmetric air entry     Cardiac:   normal rate, regular rhythm, normal S1, S2, no murmurs, rubs, clicks or gallops     Abdomen:    soft, nontender, nondistended, no masses or organomegaly     Extremities:   peripheral pulses normal no pedal edema    Skin:     Warm, dry and intact      [facility] Checklist  Sedation:   CAM-ICU: Positive or Negative for Delirium: Negative   CAM ICU:   Last Documented RASS: RASS Score: Alert and Calm   Currently ordered infusions:    Reviewed: Yes   Mobility:   Current Mobility Level:   PMP Activity: Step 3 - Bed Mobility (09/12/2022  6:00 AM)    Current PT Order: Yes  Current OT Order: Yes Reviewed: Yes   Respiratory (n/a if blank):   Ventilator Time:    Last Recorded Vent Mode:    Reviewed: Yes   Gastrointenstinal  Last Bowel Movement:   No data recorded Reviewed: Yes   CAUTI Prevention (n/a if blank):  Foley Day:        Reviewed: Yes    Blood Steam Infection Prevention (n/a if blank):    Reviewed: Yes   DVT Chemoprophylaxis (none if blank):   heparin (porcine) injection 5,000 Units  Reviewed: Yes       Assessment     Active Problems:   #       Patient has BMI=Body mass index is 27.72 kg/m.  Diagnosis: No additional diagnosis based on BMI criteria       Recent Labs     09/12/22  0426 09/11/22  1843   Sodium 130* 136     Diagnosis: Mild Hyponatremia              Plan   NEUROLOGICAL:   Delirium precautions including maintenance of day/night wake cycles, frequent reorientation as needed    CARDIOVASCULAR:   #Acute inferior STEMI  #CAD  #Hypotension  -Presented with acute inferior STEMI and s/p 3 DES  to LCx  -Reports chest pain free this morning - morning EKG reviewed with resolution of ST changes  -ASA/brilinta  -Atorvastatin  -Borderline BP with MAP hovering ~65, suspect he may be somewhat preload dependant, gentle IVF as needed.   -TTE pending  -No signs of end organ hypoperfusion - normal lactate, renal function, mentation. Warm and well perfused on exam  -Hold BB given borderline BP  -Check A1C, lipid profile    PULMONARY:   -Comfortable on room air    RENAL:   -Cr stable at 0.7    GASTROINTESTINAL:  At risk for constipation  Nutrition: Cardiac diet  Bowel Regimen  GI ppx:     INFECTIOUS DISEASE:   -No evidence of active infection    HEMATOLOGY/ONCOLOGY:   - VTE ppx: Heparin subQ      FAMILY UPDATE:   Patient's wife was updated, they are appreciative and all questions were answered.     This patient is critically ill with life-threatening condition(s) and a high probability of sudden clinically significant deterioration due to the condition(s) noted in the assessment and plan, which requires the highest level of physician/advance practice provider preparedness to intervene urgently. Full attention to the direct care of this patient was provided for the period of time noted below. Any critical care time performed today is exclusive of teaching  and billable procedures and not overlapping with any other physicians or advance practice providers.    I have personally assessed the patient and based my assessment and medical decision-making on a review of the patient's history and 24-hour interval events along with medical records, physical examination, vital signs, analysis of recent laboratory results, evaluation of radiology images, monitoring data for potential decompensation, and additional findings found in detail within ICU team notes. The findings and plan of care was discussed with the care team.    Total critical care time: 37 minutes during this encounter.    Velia Meyer, MD   09/12/2022 8:42 AM

## 2022-09-13 DIAGNOSIS — I213 ST elevation (STEMI) myocardial infarction of unspecified site: Secondary | ICD-10-CM

## 2022-09-13 LAB — CBC
Absolute NRBC: 0 10*3/uL (ref 0.00–0.00)
Hematocrit: 34.8 % — ABNORMAL LOW (ref 37.6–49.6)
Hgb: 11.4 g/dL — ABNORMAL LOW (ref 12.5–17.1)
MCH: 29.4 pg (ref 25.1–33.5)
MCHC: 32.8 g/dL (ref 31.5–35.8)
MCV: 89.7 fL (ref 78.0–96.0)
MPV: 10.7 fL (ref 8.9–12.5)
Nucleated RBC: 0 /100 WBC (ref 0.0–0.0)
Platelets: 175 10*3/uL (ref 142–346)
RBC: 3.88 10*6/uL — ABNORMAL LOW (ref 4.20–5.90)
RDW: 13 % (ref 11–15)
WBC: 5.76 10*3/uL (ref 3.10–9.50)

## 2022-09-13 LAB — WHOLE BLOOD GLUCOSE POCT
Whole Blood Glucose POCT: 88 mg/dL (ref 70–100)
Whole Blood Glucose POCT: 93 mg/dL (ref 70–100)
Whole Blood Glucose POCT: 96 mg/dL (ref 70–100)
Whole Blood Glucose POCT: 90 mg/dL (ref 70–100)
Whole Blood Glucose POCT: 103 mg/dL — ABNORMAL HIGH (ref 70–100)

## 2022-09-13 LAB — BASIC METABOLIC PANEL
Anion Gap: 6 (ref 5.0–15.0)
BUN: 7 mg/dL — ABNORMAL LOW (ref 9.0–28.0)
CO2: 22 mEq/L (ref 17–29)
Calcium: 8 mg/dL — ABNORMAL LOW (ref 8.5–10.5)
Chloride: 110 mEq/L (ref 99–111)
Creatinine: 0.8 mg/dL (ref 0.5–1.5)
Glucose: 96 mg/dL (ref 70–100)
Potassium: 4 mEq/L (ref 3.5–5.3)
Sodium: 138 mEq/L (ref 135–145)
eGFR: >60 mL/min/{1.73_m2} (ref >=60)

## 2022-09-13 LAB — FERRITIN: Ferritin: 234.7 ng/mL (ref 21.80–274.70)

## 2022-09-13 LAB — RETICULOCYTES
Immature Retic Fract: 6.8 % (ref 1.2–15.6)
Reticulocyte Count Absolute: 0.0549 10*6/uL (ref 0.0220–0.1420)
Reticulocyte Count Automated: 1.3 % (ref 0.8–2.3)
Reticulocyte Hemoglobin: 33.3 pg (ref 28.4–40.2)

## 2022-09-13 LAB — MAGNESIUM: Magnesium: 2 mg/dL (ref 1.6–2.6)

## 2022-09-13 LAB — HEMOLYSIS INDEX(SOFT): Hemolysis Index: 17 Index (ref 0–24)

## 2022-09-13 LAB — TRANSFERRIN: Transferrin: 206 mg/dL (ref 174–382)

## 2022-09-13 LAB — IRON PROFILE
Iron Saturation: 13 % — ABNORMAL LOW (ref 15–50)
Iron: 29 ug/dL — ABNORMAL LOW (ref 41–168)
TIBC: 217 ug/dL — ABNORMAL LOW (ref 261–462)
UIBC: 188 ug/dL (ref 126–382)

## 2022-09-13 LAB — VITAMIN B12: Vitamin B-12: 644 pg/mL (ref 211–911)

## 2022-09-13 MED ORDER — SODIUM CHLORIDE 0.9 % IV BOLUS
250.0000 mL | Freq: Once | INTRAVENOUS | Status: AC
Start: 2022-09-13 — End: 2022-09-13
  Administered 2022-09-13: 250 mL via INTRAVENOUS

## 2022-09-13 NOTE — Plan of Care (Addendum)
Patient A&o x4. VSS. SR on the tele monitor.  No c/o pain.Pt on cardiac diet. Eating well. Voiding in the bath room. BM x3 today. Patient downgraded from ICU. Family at the bedside and supportive.  Problem: Hemodynamic Status: Cardiac  Goal: Stable vital signs and fluid balance  Outcome: Progressing  Flowsheets (Taken 09/12/2022 1027 by Adair Patter, RN)  Stable vital signs and fluid balance:   Assess signs and symptoms associated with cardiac rhythm changes   Monitor lab values     Problem: Fluid and Electrolyte Imbalance/ Endocrine  Goal: Fluid and electrolyte balance are achieved/maintained  Outcome: Progressing  Flowsheets (Taken 09/12/2022 1027 by Adair Patter, RN)  Fluid and electrolyte balance are achieved/maintained:   Monitor/assess lab values and report abnormal values   Assess and reassess fluid and electrolyte status   Observe for cardiac arrhythmias   Monitor for muscle weakness     Problem: Nutrition  Goal: Nutritional intake is adequate  Outcome: Progressing  Flowsheets (Taken 09/12/2022 0317 by Lajuana Matte, RN)  Nutritional intake is adequate: Consult/collaborate with Speech Therapy (swallow evaluations)

## 2022-09-13 NOTE — PT Eval Note (Signed)
Physical Therapy Eval and Treatment  [name] [name]      Post Acute Care Therapy Recommendations   Discharge Recommendations:  Home with home health PT, Home with supervision during the day.    DME needs IF patient is discharging home: No additional equipment/DME recommended at this time    Therapy discharge recommendations may change with patient status.  Please refer to most recent note for up-to-date recommendations.    Unit: MED SURG ICU 1  Bed: K7425/Z5638-75    ___________________________________________________    Time of Evaluation and Treatment:  Time Calculation   PT Received On: 09/13/22  Start Time: 0910  Stop Time: 0940  Time Calculation (min): 30 min       Evaluation Time: 20 minutes  Treatment Time: 10 minutes    Chart Review and Collaboration with Care Team: 15 minutes, not included in above time         Consult received for [facility] for PT Evaluation and Treatment.  Patient's medical condition is appropriate for Physical therapy intervention at this time.    Activity Orders:  PT eval and treat    Precautions and Contraindications:  Precautions  Fall Risks: Medium, Impaired mobility  Other Precautions: speaks Dari, family agreeable to translate for pt.    Personal Protective Equipment (PPE)  gloves and procedure mask    Medical Diagnosis:  STEMI (ST elevation myocardial infarction) [I21.3]  ST elevation myocardial infarction (STEMI), unspecified artery [I21.3]    History of Present Illness:  [name] [name] is a 67 y.o. male admitted on 09/11/2022 with w/ PMHx diabetes mellitus - otherwise healthy with no notable modifiable risk factors. He was walking in the mall with his family on the evening of 12/30 when he developed acute onset chest pain and presented to the [facility] ED. ECG showed ST elevations in the inferolateral leads - II, III, aVF, V5-V6, w/ T wave inversions in aVR, V2. Hemodynamically stable at time of presentation. He was transferred emergently to [facility] for cardiac cath, during which he  was found to have 100% occlusion in the mid Lcx, as well as 40% stenosis of LAD and 20% stenosis of RCA. LVEDP 15, LVEF 50% w/ mid-inferior wall hypokinesis. He received x3 DES to LCx w/ TIMI 3 revasc.      Admitted to [facility] following cardiac cath for further monitoring post-cath w/ DES.      Patient Active Problem List   Diagnosis    Uses hearing aid    Bilateral chronic knee pain    ST elevation myocardial infarction (STEMI), unspecified artery    STEMI (ST elevation myocardial infarction)       Past Medical/Surgical History:  Past Medical History:   Diagnosis Date    Seasonal allergic rhinitis      Past Surgical History:   Procedure Laterality Date    NOSE SURGERY         X-Rays/Tests/Labs:  Lab Results   Component Value Date/Time    HGB 11.4 (L) 09/13/2022 04:29 AM    HCT 34.8 (L) 09/13/2022 04:29 AM    K 4.0 09/13/2022 04:29 AM    NA 138 09/13/2022 04:29 AM    INR 1.2 (H) 09/11/2022 07:05 PM    TROPI 57,061.7 (AA) 09/12/2022 07:42 PM    TROPI >60,000.0 (AA) 09/12/2022 03:00 AM    TROPI 108.90 (H) 09/12/2022 03:00 AM    TROPI 10.7 09/11/2022 06:43 PM       All imaging reviewed, please see chart for details.  Social History:  Prior Level of Function  Prior level of function: Independent with ADLs, Ambulates independently  Baseline Activity Level: Community ambulation  Ambulated 100 feet or more prior to admission: Yes  Driving: independent  Employment: Retired    Cabin crew Arrangements: Family members, Spouse/significant other (spouse and 7 members of the family in house.)  Type of Home: House  Home Layout: Multi-level, Bed/bath upstairs  Bathroom Shower/Tub: Cabin crew:  (none)  Home Living - Notes / Comments:  (Family very supportive.)      Subjective:  Patient is agreeable to participation in the therapy session. Family and/or guardian are agreeable to patient's participation in the therapy session. Nursing clears patient for therapy.  Patient Goal: Return  home  Pain Assessment  Pain Assessment: No/denies pain      Objective:  Observation of Patient/Vital Signs:  Rest:  BP 93/60 HR 69 O2 sats 97% on RA.  After supine there ex BP 109/55  Sitting EOB BP 108/62  After treatment patient in supine BP 110/71 heart rate 77 O2 sat 98%         Cognitive Status and Neuro Exam:  Cognition/Neuro Status  Arousal/Alertness: Appropriate responses to stimuli  Attention Span: Appears intact  Orientation Level: Oriented X4  Memory: Appears intact  Following Commands: Follows all commands and directions without difficulty  Safety Awareness: independent  Insights: Fully aware of deficits  Problem Solving: Able to problem solve independently    Musculoskeletal Examination  Gross ROM  Right Upper Extremity ROM: within functional limits  Left Upper Extremity ROM: within functional limits  Right Lower Extremity ROM: within functional limits  Left Lower Extremity ROM: within functional limits  Gross Strength  Right Lower Extremity Strength: within functional limits  Left Lower Extremity Strength: within functional limits       Functional Mobility:  Functional Mobility  Supine to Sit: Stand by Assist;HOB raised;to Right  Scooting to EOB: Stand by Assist  Sit to Supine: Contact Guard Assist;to Left  Sit to Stand: Stand by Assist;bed elevated  Stand to Sit: Stand by Assist     Locomotion  Ambulation: Contact Guard Assist  Pattern: shuffle     Balance  Balance  Balance: needs focused assessment  Sitting - Static: Good  Sitting - Dynamic: Good  Standing - Static: Good  Standing - Dynamic: Good (Patient able to balance with eyes closed and able to march in place with eyes open no loss of balance.)    Participation and Activity Tolerance  Participation and Endurance  Participation Effort: good  Endurance: Tolerates < 10 min exercise, no significant change in vital signs    Educated the Patient and Family to role of physical therapy, plan of care, goals of therapy and safety with mobility and ADLs,  home safety with verbalized understanding  and questions asked and answered .    Patient left in bed with alarm and all other medical equipment in place and call bell and all personal items/needs within reach.  RN notified of session outcome. Notified CM team and attending of d/c recommendation via secure chat.      Assessment:  [name] [name] is a 67 y.o. male admitted 09/11/2022.  PT Assessment  Assessment: Decreased endurance/activity tolerance;Decreased balance;Gait impairment;Decreased functional mobility  Prognosis: Good;With continued PT status post acute discharge  Progress: Improving as expected      Treatment:  Patient received supine in bed.  Supine there ex included approximately 20 reps each: Bilateral ankle  pumps alternating hip knee flexion active range of motion/supine marching.  Blood pressure reading taken BP 109/55.  Supine to sit with SBA.  Patient sat EOB with good balance and denies dizziness.  Sit to stand with standby assist.  Patient able to stand with eyes closed and no loss of balance.  Patient able to march in place with eyes open and no loss of balance.  Stand to sit standby assist.  Sit to supine with contact-guard assist.  Patient able to reposition self independently.  Patient and family educated in benefits of staying on 1 level upon return home to reduce the amount of times patient would be required to go up and down the stairs.  Questions asked and answered.  Recommend home health PT to assist in patient returning to his prior level of function.  Family asking about return exercise, advise asking cardiologist for specific exercise guidelines at this time.      Plan:  Treatment/Interventions: Exercise, Gait training, Stair training, Neuromuscular re-education, Functional transfer training, LE strengthening/ROM, Endurance training, Patient/family training, Bed mobility, Compensatory technique education  PT Frequency: 1-2x/wk       PMP Activity: Step 6 - Walks in Room  Distance Walked  (ft) (Step 6,7): 100 Feet      Goals:  Goals  Goal Formulation: With patient/family  Time for Goal Acheivement: By time of discharge  Goals: Select goal  Pt Will Go Supine To Sit: independent, to maximize functional mobility and independence, by time of discharge, Partly met  Pt Will Perform Sit to Stand: independent, to maximize functional mobility and independence, by time of discharge, Partly met  Pt Will Ambulate: 51-100 feet, independent, to maximize functional mobility and independence, by time of discharge, Partly met  Pt Will Go Up / Down Stairs: 1 flight, modified independent, With rail, to maximize functional mobility and independence, by time of discharge, Not met    Percell Boston, PT  [place] PT license #[id number]  [phone]  09/13/2022 11:58 AM  M: [phone]  W, F: [phone]      [facility]  Patient: [name] [name] MRN#: [id number]  Unit: MED SURG ICU 1 Bed: A2907/A2907-01

## 2022-09-13 NOTE — Progress Notes (Signed)
[place], MD   [facility]  Office phone:  [phone]     [facility] phone [phone]  [facility] phone [phone]    Date:  09/13/2022  7:04 AM    Patient:  [name] [name].  DOB: 20-May-1956.  67 y.o.  male , MRN [id number]   Attending:  Modesto Charon, MD   Admission date:  09/11/2022     ASSESSMENT & PLAN:     mpression     Inferolateral STEMI due to 100% occluded mid LCx  S/p DES x 3 LCx (09/01/22)  NSVT  hypotension        Plan       DAPT: ASA 81 mg and brilinta 90 mg bid  High dose statin: lipitor 80 mg qhs  Hold off on BB for now given borderline blood pressure    Ok to transfer out of ICU  Keep hospitalization for at least another 24 hours to monitor for nsvt / hypotension  Stop checking troponin       subjective     No chest pain      MEDICATIONS:    INFUSION MEDS:      SCHEDULED MEDS:     Current Facility-Administered Medications   Medication Dose Route Frequency    aspirin EC  81 mg Oral Daily    atorvastatin  80 mg Oral QHS    heparin (porcine)  5,000 Units Subcutaneous Q8H [place]    insulin lispro  1-3 Units Subcutaneous QHS    insulin lispro  1-5 Units Subcutaneous TID AC    senna-docusate  1 tablet Oral Q12H [facility]    ticagrelor  90 mg Oral Q12H      PRN MEDS:   acetaminophen, calcium carbonate, calcium GLUConate, dextrose **OR** dextrose **OR** dextrose **OR** glucagon (rDNA), magnesium oxide **OR** magnesium sulfate, melatonin, potassium chloride **OR** potassium chloride **OR** potassium chloride, sodium phosphates 15 mmol in dextrose 5 % 250 mL IVPB, sodium phosphates 25 mmol in dextrose 5 % 250 mL IVPB, sodium phosphates 35 mmol in dextrose 5 % 250 mL IVPB         LABS:     Recent Labs   Lab 09/13/22  0429 09/12/22  0300 09/11/22  1843   WBC 5.76 7.80 7.95   Hgb 11.4* 11.3* 13.3   Hematocrit 34.8* 33.3* 40.4   Platelets 175 186 231     Recent Labs   Lab 09/13/22  0429 09/12/22  0426 09/11/22  1843   Sodium 138 130* 136   Potassium  4.0 3.7 3.6   Chloride 110 104 101   CO2 22 20 25    BUN 7.0* 14.0 14.0   Creatinine 0.8 0.7 0.8   eGFR >60.0 >60.0 >60.0   Glucose 96 138* 144*   Calcium 8.0* 7.6* 9.2     Recent Labs   Lab 09/12/22  1942 09/12/22  0300   Troponin I  --  108.90*   hs Troponin-I 57,061.7* >60,000.0*            FLUID BALANCE AND WEIGHT:                                           Last filed weight: Weight: 82.9 kg (182 lb 12.2 oz)  Weight on Admission: Weight: 77.9 kg (171 lb 11.8 oz)  Net IO Since Admission: -930.54 mL [  09/13/22 0704]     Intake/Output Summary (Last 24 hours) at 09/13/2022 0700  Last data filed at 09/13/2022 0400  Gross per 24 hour   Intake 30 ml   Output 1950 ml   Net -1920 ml   Weight change: 5 kg (11 lb 0.4 oz)    Last 3 Weights for the past 72 hrs (Last 3 readings):   Weight   09/13/22 0400 82.9 kg (182 lb 12.2 oz)   09/12/22 0356 77.9 kg (171 lb 11.8 oz)        PHYSICAL EXAM:                                                                 BP 101/60   Pulse 67   Temp 97.2 F (36.2 C) (Oral)   Resp 22   Ht 1.676 m (5\' 6" )   Wt 82.9 kg (182 lb 12.2 oz)   SpO2 98%   BMI 29.50 kg/m   Physical Examination: General appearance - alert, well appearing, and in no distress  Chest - clear to auscultation, no wheezes, rales or rhonchi, symmetric air entry  Heart - normal rate and regular rhythm, S1 and S2 normal  Abdomen - nontender  Extremities - no pedal edema noted        Tele - nsr, nsvt 14 beats    Echo : ef 59%, mild MR / TR; ascending aorta 4.2 cm

## 2022-09-13 NOTE — Progress Notes (Signed)
[facility]  Progress Note      Patient Name: [name] [name]  MRN: [id number]  Room: A2907/A2907-01    Reason for Admission: ST elevation myocardial infarction (STEMI), unspecified artery  ICU Day: 1d 14h      Overview   [name] [name] is a 67 y.o. male who presents with acute inferior STEMI now s/p 3 DES to circumflex    Subjective   Hospital Course:  12/30: Admitted inferior STEMI, 3 DES to Cx  12/31: Patient stayed in the ICU with concerns for labile blood pressure, responded to fluids  1/1: Patient seen and examined at bedside.  No acute events overnight.  Patient with blood pressure within normal limits.  Patient denies any complaints of chest pain or shortness of breath.     Objective   Physical Examination     Last vent settings if on vent:       Vitals Temp:  [97.2 F (36.2 C)-98.6 F (37 C)]   Heart Rate:  [58-73]   Resp Rate:  [21-31]   BP: (77-109)/(48-71)   SpO2:  [94 %-98 %]   Weight:  [82.9 kg (182 lb 12.2 oz)]  // Temperature with 24 range    Input / Output:   Intake/Output Summary (Last 24 hours) at 09/13/2022 1153  Last data filed at 09/13/2022 0900  Gross per 24 hour   Intake 500 ml   Output 1450 ml   Net -950 ml         General:     Neuro:    Non-focal neuro exam alert & oriented x 3     Lungs:   clear to auscultation, no wheezes, rales or rhonchi, symmetric air entry     Cardiac:   normal rate, regular rhythm, normal S1, S2, no murmurs, rubs, clicks or gallops     Abdomen:    soft, nontender, nondistended, no masses or organomegaly     Extremities:   peripheral pulses normal no pedal edema    Skin:     Warm, dry and intact      Assessment     Active Problems:   #       Patient has BMI=Body mass index is 29.5 kg/m.  Diagnosis: No additional diagnosis based on BMI criteria         Recent Labs     09/13/22  0429 09/12/22  0426 09/11/22  1843   Sodium 138 130* 136       Diagnosis: Mild Hyponatremia              Plan   NEUROLOGICAL:   Delirium precautions including maintenance of  day/night wake cycles, frequent reorientation as needed    CARDIOVASCULAR:   #Acute inferior STEMI  #CAD  #Hypotension, resolved  -Presented with acute inferior STEMI and s/p 3 DES to LCx  -Reports chest pain free this morning - morning EKG reviewed with resolution of ST changes  -ASA/brilinta  -Atorvastatin  -Labile blood pressure however now improved and normotensive, continue to hold beta-blocker and ACE for now  -Echo done which shows EF of 59% with normal wall motion abnormalities and mild wall thickness increase, and right ventricular cavity normal in size with normal function, no overt wall motion abnormalities noted  -Hold BB given borderline BP  -Hemoglobin 1 AC reported at 5.2, lipid panel urinary reviewed, would need high intensity statin given MI    PULMONARY:   -Comfortable on room air    RENAL:   -Cr stable at  0.7    GASTROINTESTINAL:  At risk for constipation  Nutrition: Cardiac diet  Bowel Regimen  GI ppx:     INFECTIOUS DISEASE:   -No evidence of active infection    HEMATOLOGY/ONCOLOGY:   - VTE ppx: Heparin subQ      FAMILY UPDATE:   Patient's wife was updated, they are appreciative and all questions were answered.     Transfer to [facility] telemetry discussed with hospitalist and family    I have personally assessed the patient and based my assessment and medical decision-making on a review of the patient's history and 24-hour interval events along with medical records, physical examination, vital signs, analysis of recent laboratory results, evaluation of radiology images, monitoring data for potential decompensation, and additional findings found in detail within ICU team notes. The findings and plan of care was discussed with the care team.    Total care time: 40 minutes during this encounter.    Modesto Charon, MD   09/13/2022 11:53 AM

## 2022-09-13 NOTE — H&P (Signed)
[facility]      Patient: [name] [name]  Date: 09/11/2022   DOB: [birth year]  Date of Admission: 09/11/2022   MRN: [id number]  Attending: Gayleen Orem, MD         No chief complaint on file.       History Gathered From: patient    HISTORY AND PHYSICAL     [name] [name] is a 67 y.o. male with a PMHx of DM,  who presented with chest pain found to to have STEMI on lead ii, iii, avf, v5-v6 T wave inversion on AVR and V2, S/P cardiac cath and 3 DES stent to Lcx with TIMI 3 Revasc. Patient was inially admitted to ICU and stayed for 48 hrs and downgraded after cardiology cleared for downgrade.  Currently patient denies any chest pain, lightheadedness, nausea, vomiting, abdominal pain, fever, chills, diarrhea, dysuria or shortness of breath      Past Medical History:   Diagnosis Date    Seasonal allergic rhinitis        Past Surgical History:   Procedure Laterality Date    NOSE SURGERY         Prior to Admission medications    Medication Sig Start Date End Date Taking? Authorizing Provider   albuterol (PROVENTIL) (2.5 MG/3ML) 0.083% nebulizer solution Take 3 mLs (2.5 mg) by nebulization every 4 (four) hours as needed for Wheezing or Shortness of Breath (cough) 08/11/22 09/10/22  Olin Hauser, MD   benzonatate (TESSALON) 100 MG capsule Take 1 capsule (100 mg) by mouth 3 (three) times daily as needed for Cough 08/11/22   Olin Hauser, MD   fluticasone Aleda Grana) 50 MCG/ACT nasal spray 2 sprays by Nasal route daily for 7 days 08/11/22 08/18/22  Olin Hauser, MD   Nebulizer Misc Dispense one nebulizer machine to include mask and tubing 08/11/22   Olin Hauser, MD   vitamin D, ergocalciferol, (DRISDOL) 50000 UNIT Cap Take 1 capsule (50,000 Units) by mouth once a week for the next 8 weeks. 10/05/22 11/30/22  Seriki, Opetomi O, DO       No Known Allergies    No family history on file.    Social History     Tobacco Use    Smoking status: Never    Smokeless tobacco: Never   Vaping Use    Vaping Use: Never  used   Substance Use Topics    Alcohol use: Not Currently    Drug use: Never       REVIEW OF SYSTEMS   Positive for: None  Negative for: Negative except mentioned in the HPI  All ROS completed and otherwise negative.    PHYSICAL EXAM     Vital Signs (most recent): BP 109/71   Pulse 73   Temp 98.2 F (36.8 C) (Oral)   Resp (!) 24   Ht 1.676 m (5\' 6" )   Wt 82.9 kg (182 lb 12.2 oz)   SpO2 97%   BMI 29.50 kg/m   Constitutional: No apparent distress. Patient speaks freely in full sentences.   HEENT: NC/AT, PERRL, no scleral icterus or conjunctival pallor, no nasal discharge, MMM, oropharynx without erythema or exudate  Neck: trachea midline, supple, no cervical or supraclavicular lymphadenopathy or masses  Cardiovascular: RRR, normal S1 S2, no murmurs, gallops, palpable thrills, no JVD, Non-displaced PMI.  Respiratory: Normal rate. No retractions or increased work of breathing. Clear to auscultation and percussion bilaterally.  Gastrointestinal: +BS, non-distended, soft, non-tender, no rebound or guarding, no hepatosplenomegaly  Genitourinary: no suprapubic or costovertebral angle tenderness  Musculoskeletal: No peripheral edema, cardiac cath site is right wrist no tenderness or swelling  Neurologic: EOMI, CN 2-12 grossly intact. no gross motor or sensory deficits  Psychiatric: AAOx3, affect and mood appropriate. The patient is alert, interactive, appropriate.    LABS & IMAGING     Recent Results (from the past 24 hour(s))   Glucose Whole Blood - POCT    Collection Time: 09/12/22 11:25 AM   Result Value Ref Range    Whole Blood Glucose POCT 108 (H) 70 - 100 mg/dL   Echo Adult TTE Complete    Collection Time: 09/12/22 12:21 PM   Result Value Ref Range    IVS Diastolic Thickness (2D) 1.07     LVID diastole (2D) 4.64     LVID systole (2D) 3.24     BP Mod LV Ejection Fraction 58.917     LA Dimension (2D) 3.3     AV Mean Gradient 3     AV Peak Velocity 1.23     AV Area (Cont Eq VTI) 2.469     AV Area (Cont Eq VTI)  2.47     Prox Ascending Aorta Diameter 4.2     MV E/A 0.83     MV E/A 0.8     RV Basal Diastolic Dimension 3.45     TAPSE 2.14     Site RV Size (AS) normal in size     RV Function Normal     MV Regurgitation Severity mild     TV Regurgitation Severity mild     PV Regurgitation Severity no significant     Atrial Septum Findings No evidence of interatrial shunt by color Doppler.     Aortic Valve Findings There is no aortic regurgitation.     Aortic Valve Findings The aortic valve is not well visualized.     Aortic Valve Findings There is no aortic stenosis.     Pulmonary Valve Findings The pulmonic valve is not well visualized.     Pulmonary Valve Findings There is no significant pulmonic regurgitation.     Mitral Valve Findings The mitral valve is structurally normal.     Mitral Valve Findings There is mild mitral regurgitation.     Tricuspid Valve Findings The tricuspid valve is structurally normal.     Tricuspid Valve Findings There is mild tricuspid regurgitation.     Tricuspid Valve Findings       No pulmonary hypertension with estimated right ventricular systolic pressure of  24 mmHg.    MV Area (PHT) 3.464     RV Systolic Pressure 23.612     LA Volume Index (BP A-L) 20     MV E/e' (Average) 8.076    Glucose Whole Blood - POCT    Collection Time: 09/12/22  3:36 PM   Result Value Ref Range    Whole Blood Glucose POCT 113 (H) 70 - 100 mg/dL   High Sensitivity Troponin-I    Collection Time: 09/12/22  7:42 PM   Result Value Ref Range    hs Troponin-I 57,061.7 (AA) SEE BELOW ng/L   Glucose Whole Blood - POCT    Collection Time: 09/12/22  9:52 PM   Result Value Ref Range    Whole Blood Glucose POCT 92 70 - 100 mg/dL   Basic Metabolic Panel    Collection Time: 09/13/22  4:29 AM   Result Value Ref Range    Glucose 96 70 - 100 mg/dL  BUN 7.0 (L) 9.0 - 28.0 mg/dL    Creatinine 0.8 0.5 - 1.5 mg/dL    Calcium 8.0 (L) 8.5 - 10.5 mg/dL    Sodium 138 135 - 145 mEq/L    Potassium 4.0 3.5 - 5.3 mEq/L    Chloride 110 99 - 111  mEq/L    CO2 22 17 - 29 mEq/L    Anion Gap 6.0 5.0 - 15.0    eGFR >60.0 >=60 mL/min/1.73 m2   CBC without differential    Collection Time: 09/13/22  4:29 AM   Result Value Ref Range    WBC 5.76 3.10 - 9.50 x10 3/uL    Hgb 11.4 (L) 12.5 - 17.1 g/dL    Hematocrit 34.8 (L) 37.6 - 49.6 %    Platelets 175 142 - 346 x10 3/uL    RBC 3.88 (L) 4.20 - 5.90 x10 6/uL    MCV 89.7 78.0 - 96.0 fL    MCH 29.4 25.1 - 33.5 pg    MCHC 32.8 31.5 - 35.8 g/dL    RDW 13 11 - 15 %    MPV 10.7 8.9 - 12.5 fL    Nucleated RBC 0.0 0.0 - 0.0 /100 WBC    Absolute NRBC 0.00 0.00 - 0.00 x10 3/uL   Magnesium    Collection Time: 09/13/22  4:29 AM   Result Value Ref Range    Magnesium 2.0 1.6 - 2.6 mg/dL   Glucose Whole Blood - POCT    Collection Time: 09/13/22  7:42 AM   Result Value Ref Range    Whole Blood Glucose POCT 88 70 - 100 mg/dL       MICROBIOLOGY:  Blood Culture: Not indicated  Urine Culture: Not indicated  Antibiotics Started: Not indicated    IMAGING:  Upon my review: Reviewed    CARDIAC:  EKG Interpretation (upon my review): Reviewed    Markers:  Recent Labs   Lab 09/12/22  1942 09/12/22  0300   Troponin I  --  108.90*   hs Troponin-I 57,061.7* >60,000.0*       EMERGENCY DEPARTMENT COURSE:  Orders Placed This Encounter   Procedures    CBC without differential    Hemoglobin A1C    Calcium, ionized    Hepatic function panel (LFT)    Lipid panel    Troponin I - restricted ordering    Lactic Acid    Basic Metabolic Panel    Hepatic function panel (LFT)    Magnesium    Phosphorus    High Sensitivity Troponin-I    Basic Metabolic Panel    CBC without differential    Magnesium    Diet heart healthy    Cardiovascular invasive staff may remove procedural sheaths    Continuous Pulse Oximetry    Progressive Mobility Protocol    Notify physician    Post procedure site assessment    Notify physician    Height and weight on Admission    Weight    Critical Care Mobility protocol    Pain Assessment    Nursing communication    Place sequential  compression device    Maintain sequential compression device    Vital signs (with SPO2)    Notify RN    Target Glucose Goals    Nursing communication    NSG Communication: Glucose POCT order (PRN hypoglycemia)    Notify physician (Critical Blood Glucose Value)    Notify physician (Communication: Document Abnormal Blood Glucose)    POCT order (PRN hypoglycemia)  Adult Hypoglycemia Treatment Algorithm    NSG Communication: Glucose POCT order    NSG Communication: Glucose POCT order    Full Code    OT eval and treat    PT evaluate and treat    I-STAT ACT KAOLIN    I-STAT ACT KAOLIN    Glucose Whole Blood - POCT    Glucose Whole Blood - POCT    Glucose Whole Blood - POCT    Glucose Whole Blood - POCT    Glucose Whole Blood - POCT    Glucose Whole Blood - POCT    ECG 12 lead (QAM x 1)    ECG 12 lead    ECG 12 lead    Echo Adult TTE Complete    Saline lock IV    ADMIT TO INPATIENT    ADMIT TO INPATIENT    Supervise For Meals Frequency: All meals       ASSESSMENT & PLAN     [name] [name] is a 67 y.o. male admitted with ST elevation myocardial infarction (STEMI), unspecified artery.    Patient Active Hospital Problem List:  Inferiolateral STEMI  Prestented with chest pain  EKG showed STEMI to inferiolater leads  S/p PCI with 3 DES stent to mid Lcx   Troponin peaked so no need to tend per card  Currently on Asa/brilenta, statin and bb on hold because of hypotension  2d echo showed EF of 59%     Mildly diaalted assending aorta  Outpatient follow up.    Type 2 DM  A1c, 5.2  On sliding scale  Continue sliding scale    Anemia  Will get anemia workup  No indication for transfusion at this point      Patient has BMI=Body mass index is 29.5 kg/m.  Diagnosis: Overweight based on BMI criteria       Recent Labs     09/13/22  0429 09/12/22  0426 09/11/22  1843   Sodium 138 130* 136     Diagnosis: Mild Hyponatremia     Nutrition: Carb controlled heart healthy Diet    DVT/VTE Prophylaxis:   Current Facility-Administered Medications  (Includes Only Anticoagulants, Misc. Hematological)   Medication Dose Route Last Admin    heparin (porcine) injection 5,000 Units  5,000 Units Subcutaneous 5,000 Units at 09/13/22 1610    ticagrelor (BRILINTA) tablet 90 mg  90 mg Oral 90 mg at 09/13/22 9604       Code Status: Full Code    Patient Class: INPATIENT. Inpatient status is judged to be reasonable and necessary in order to provide the required intensity of service to ensure the patient's safety. The patient's presenting symptoms, physical exam findings, and initial radiographic and laboratory data in the context of their chronic comorbidities is felt to place them at high risk for further clinical deterioration. Furthermore, it is not anticipated that the patient will be medically stable for discharge from the hospital within 2 midnights of admission. The following factors support the admission status of inpatient: the patient's presenting symptoms of chest pain, worrisome physical exam findings of hypotension, worrisome laboratory data of elevated troponin, and significant comorbidities including diabetes.    I certify that at the point of admission it is my clinical judgment that the patient will require inpatient hospital care spanning beyond 2 midnights from the point of admission due to high intensity of service, high risk for further deterioration and high frequency of surveillance required.     Anticipated medical stability for discharge: 24 -  48 Hours      Signed,  Gayleen Orem, MD    09/13/2022 10:42 AM  Time Elapsed: 60

## 2022-09-14 LAB — BASIC METABOLIC PANEL
Anion Gap: 6 (ref 5.0–15.0)
BUN: 11 mg/dL (ref 9.0–28.0)
CO2: 22 mEq/L (ref 17–29)
Calcium: 7.7 mg/dL — ABNORMAL LOW (ref 8.5–10.5)
Chloride: 110 mEq/L (ref 99–111)
Creatinine: 0.9 mg/dL (ref 0.5–1.5)
Glucose: 87 mg/dL (ref 70–100)
Potassium: 3.9 mEq/L (ref 3.5–5.3)
Sodium: 138 mEq/L (ref 135–145)
eGFR: >60 mL/min/{1.73_m2} (ref >=60)

## 2022-09-14 LAB — PHOSPHORUS: Phosphorus: 3.6 mg/dL (ref 2.3–4.7)

## 2022-09-14 LAB — ECG 12-LEAD
Atrial Rate: 66 {beats}/min
IHS MUSE NARRATIVE AND IMPRESSION: NORMAL
P Axis: 67 degrees
P-R Interval: 192 ms
Q-T Interval: 394 ms
QRS Duration: 82 ms
QTC Calculation (Bezet): 413 ms
R Axis: -15 degrees
T Axis: 34 degrees
Ventricular Rate: 66 {beats}/min

## 2022-09-14 LAB — CBC AND DIFFERENTIAL
Absolute NRBC: 0 10*3/uL (ref 0.00–0.00)
Basophils Absolute Automated: 0.02 10*3/uL (ref 0.00–0.08)
Basophils Automated: 0.3 %
Eosinophils Absolute Automated: 0.29 10*3/uL (ref 0.00–0.44)
Eosinophils Automated: 4.3 %
Hematocrit: 35.9 % — ABNORMAL LOW (ref 37.6–49.6)
Hgb: 11.5 g/dL — ABNORMAL LOW (ref 12.5–17.1)
Immature Granulocytes Absolute: 0.02 10*3/uL (ref 0.00–0.07)
Immature Granulocytes: 0.3 %
Instrument Absolute Neutrophil Count: 3.94 10*3/uL (ref 1.10–6.33)
Lymphocytes Absolute Automated: 1.65 10*3/uL (ref 0.42–3.22)
Lymphocytes Automated: 24.7 %
MCH: 29.1 pg (ref 25.1–33.5)
MCHC: 32 g/dL (ref 31.5–35.8)
MCV: 90.9 fL (ref 78.0–96.0)
MPV: 10.7 fL (ref 8.9–12.5)
Monocytes Absolute Automated: 0.77 10*3/uL (ref 0.21–0.85)
Monocytes: 11.5 %
Neutrophils Absolute: 3.94 10*3/uL (ref 1.10–6.33)
Neutrophils: 58.9 %
Nucleated RBC: 0 /100 WBC (ref 0.0–0.0)
Platelets: 177 10*3/uL (ref 142–346)
RBC: 3.95 10*6/uL — ABNORMAL LOW (ref 4.20–5.90)
RDW: 13 % (ref 11–15)
WBC: 6.69 10*3/uL (ref 3.10–9.50)

## 2022-09-14 LAB — WHOLE BLOOD GLUCOSE POCT
Whole Blood Glucose POCT: 109 mg/dL — ABNORMAL HIGH (ref 70–100)
Whole Blood Glucose POCT: 81 mg/dL (ref 70–100)

## 2022-09-14 LAB — MAGNESIUM: Magnesium: 1.8 mg/dL (ref 1.6–2.6)

## 2022-09-14 MED ORDER — VITAMIN D (ERGOCALCIFEROL) 1.25 MG (50000 UT) PO CAPS
50000.0000 [IU] | ORAL_CAPSULE | ORAL | 0 refills | Status: DC
Start: 2022-10-05 — End: 2022-11-12

## 2022-09-14 MED ORDER — FERROUS SULFATE 324 (65 FE) MG PO TBEC
324.0000 mg | DELAYED_RELEASE_TABLET | Freq: Every morning | ORAL | 0 refills | Status: AC
Start: 2022-09-14 — End: 2022-11-13

## 2022-09-14 MED ORDER — ASPIRIN 81 MG PO TBEC
81.0000 mg | DELAYED_RELEASE_TABLET | Freq: Every day | ORAL | 1 refills | Status: DC
Start: 2022-09-15 — End: 2022-11-09

## 2022-09-14 MED ORDER — TICAGRELOR 90 MG PO TABS
90.0000 mg | ORAL_TABLET | Freq: Two times a day (BID) | ORAL | 1 refills | Status: DC
Start: 2022-09-14 — End: 2022-09-23

## 2022-09-14 MED ORDER — FLUTICASONE PROPIONATE 50 MCG/ACT NA SUSP
2.0000 | Freq: Every day | NASAL | 0 refills | Status: DC
Start: 2022-09-14 — End: 2022-10-11

## 2022-09-14 MED ORDER — ATORVASTATIN CALCIUM 80 MG PO TABS
80.0000 mg | ORAL_TABLET | Freq: Every evening | ORAL | 1 refills | Status: DC
Start: 2022-09-14 — End: 2022-09-23

## 2022-09-14 MED ORDER — NEBULIZER MISC
0 refills | Status: DC
Start: 2022-09-14 — End: 2022-09-30

## 2022-09-14 MED ORDER — BENZONATATE 100 MG PO CAPS
100.0000 mg | ORAL_CAPSULE | Freq: Three times a day (TID) | ORAL | 0 refills | Status: DC | PRN
Start: 2022-09-14 — End: 2022-09-30

## 2022-09-14 NOTE — Nursing Progress Note (Signed)
Pt safely transferred to [facility] 2[facility] A from ICU at [facility] accompanied by RN and wife. Report received from RN. VSS. Pt id band verified and oriented to unit. Pt's SpO2 is 96% at RA. Pt reports a pain score of 0/10. SCDs on as DVT prophylaxis. Educated on IS use. Pt educated on importance of call bell use for pain and toileting needs. Pt belongings in room. All (high/moderate) fall risk precautions in place.

## 2022-09-14 NOTE — Plan of Care (Signed)
Problem: Moderate/High Fall Risk Score >5  Goal: Patient will remain free of falls  Outcome: Adequate for Discharge     Problem: Compromised Activity/Mobility  Goal: Activity/Mobility Interventions  Outcome: Adequate for Discharge     Problem: Compromised Nutrition  Goal: Nutrition Interventions  Outcome: Adequate for Discharge     Problem: Pain interferes with ability to perform ADL  Goal: Pain at adequate level as identified by patient  Outcome: Adequate for Discharge     Problem: Side Effects from Pain Analgesia  Goal: Patient will experience minimal side effects of analgesic therapy  Outcome: Adequate for Discharge     Problem: Hemodynamic Status: Cardiac  Goal: Stable vital signs and fluid balance  Outcome: Adequate for Discharge     Problem: Inadequate Tissue Perfusion  Goal: Adequate tissue perfusion will be maintained  Outcome: Adequate for Discharge     Problem: Fluid and Electrolyte Imbalance/ Endocrine  Goal: Fluid and electrolyte balance are achieved/maintained  Outcome: Adequate for Discharge  Goal: Adequate hydration  Outcome: Adequate for Discharge     Problem: Nutrition  Goal: Nutritional intake is adequate  Outcome: Adequate for Discharge  Goal: Patient maintains weight  Outcome: Adequate for Discharge  Goal: Food and/or nutrient delivery  Outcome: Adequate for Discharge

## 2022-09-14 NOTE — Discharge Instr - AVS First Page (Addendum)
[facility] DISCHARGE INSTRUCTIONS     Date of Admission: 09/11/2022    Date of Discharge: 09/14/2022    Discharge Physician: Gayleen Orem, MD    Thank you for choosing the [facility]  for your healthcare needs. It was a privilege caring for you. I am genuinely concerned about your health and comfort.      Below, please find detailed instructions regarding your medical care.     You were admitted for ST elevation myocardial infarction (STEMI), unspecified artery. It is important that you make your follow up appointments listed below.  Bring these discharge papers to your follow-up visit with your medical provider.   I cannot stress the importance of follow up enough.      Consultants: cardiology     Pending Studies: none    Discharge Diet: heart healthy and carb controlled diet     Surgery/Procedure: cardiac cath and stent placement     Further Instructions:  please keep up with your medication and follow up.     Follow up Appointments:   Follow-up Information       Sioco, Crissie Sickles III, MD Follow up in 3 day(s).    Specialty: Family Medicine  Contact information:  [address]  [address] [address]  [phone]               Nelda Bucks, MD Follow up in 1 week(s).    Specialties: Cardiology, Interventional Cardiology  Contact information:  [address]  [address]   [place] [address]  [phone]               Launa Flight, MD Follow up.    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  Contact information:  [address] [place]  [place] [address]  [phone]                                 Please take your medications as prescribed. As we discussed, please review the attached handouts for more information about important side effects of your medication.    If you have any fever, chest pain, palpitations, shortness of breath or difficulty breathing, worsening nausea and vomiting, or lower leg pain  please seek medical attention immediately.     Finally, below please find the results of imaging studies that you had in the hospital. Please review this with your primary care doctor.    You will likely be receiving a survey about the care you received in our hospital. It would mean a lot to me and all of the care team if you could fill that out and return the form. We're always looking to improve and your feedback will help [place] do that.     I wish you good health. Please do not hesitate to contact me if I can be of assistance. I aim to provide excellent care.     Respectfully Yours,    Gayleen Orem, MD        If you are unable to obtain an appointment, unable to obtain newly prescribed medications, or are unclear about any of your discharge instructions please contact your discharging physician at [phone] (M-F, 8am-3pm) or weekends and after hours via the hospital operator [phone], hospital case manager, or your primary care physician.    Imaging Studies while hospitalized:  No  results found.  Echo Results       Procedure Component Value Units Date/Time    Echo Adult TTE Complete [[id number]] Collected: 09/12/22 1106     Updated: 09/12/22 3888     IVS Diastolic Thickness (2D) 2.80     LVID diastole (2D) 4.64     LVID systole (2D) 3.24     BP Mod LV Ejection Fraction 58.917     LA Dimension (2D) 3.3     AV Mean Gradient 3     AV Peak Velocity 1.23     AV Area (Cont Eq VTI) 2.469     AV Area (Cont Eq VTI) 2.47     Prox Ascending Aorta Diameter 4.2     MV E/A 0.83     MV E/A 0.8     RV Basal Diastolic Dimension 0.34     TAPSE 2.14     Site RV Size (AS) normal in size     RV Function Normal     MV Regurgitation Severity mild     TV Regurgitation Severity mild     PV Regurgitation Severity no significant     Atrial Septum Findings No evidence of interatrial shunt by color Doppler.     Aortic Valve Findings There is no aortic regurgitation.     Aortic Valve Findings The aortic valve is not well visualized.      Aortic Valve Findings There is no aortic stenosis.     Pulmonary Valve Findings The pulmonic valve is not well visualized.     Pulmonary Valve Findings There is no significant pulmonic regurgitation.     Mitral Valve Findings The mitral valve is structurally normal.     Mitral Valve Findings There is mild mitral regurgitation.     Tricuspid Valve Findings The tricuspid valve is structurally normal.     Tricuspid Valve Findings There is mild tricuspid regurgitation.     Tricuspid Valve Findings No pulmonary hypertension with estimated right ventricular systolic pressure of  24 mmHg.     MV Area (PHT) 9.179     RV Systolic Pressure 15.056     LA Volume Index (BP A-L) 20     MV E/e' (Average) 8.076    Narrative:      Name:     [name] [name]  Age:     67 years  DOB:     1956/01/18  Gender:     Male  MRN:     [id number]  Wt:     172 lb  BSA:     1.92 m2  HR:     62 bpm  Systolic BP:     89 mmHg  Diastolic BP:     55 mmHg  Technical Quality:     Fair  Exam Date/Time:     09/12/2022 11:06 AM  Study Site:     [facility]  Exam Room:     Bedside  Exam Type:     ECHO ADULT TTE COMPLETE    Study Info  Indications      Acute heart failure -  Procedure    Complete two-dimensional, color flow and spectral Doppler transthoracic  echocardiogram is performed.    Staff  Sonographer:     Steele Sizer RDCS, RVT  Ordering Physician:     Rema Jasmine    66 in      Summary    * The left ventricular cavity size is normal.  Left ventricular wall  thickness is mildly  increased.  Left ventricular global systolic function is  normal.  Calculated LV ejection fraction is  59 % by the Simpsons biplane  method.    * The right ventricular cavity size is normal in size.  Normal right  ventricular systolic function.    * The right atrium is mildly dilated.    * There is mild mitral regurgitation.    * There is mild tricuspid regurgitation.  No pulmonary hypertension.    * IVC diameter and inspiratory sniff response consistent with estimated  right atrial  pressure of 8 mmHg (range 5-10).    * The ascending aorta is mildly dilated at 4.2 cm in diameter.    * No prior study is available for comparison.      Findings  Left Ventricle    The left ventricular cavity size is normal. Left ventricular wall thickness  is mildly increased. Left ventricular global systolic function is normal.  Calculated LV ejection fraction is  59 % by the Simpsons biplane method. Left  ventricular segmental wall motion is grossly normal, but subtle wall motion  abnormalities cannot be excluded as technically limited study. Left  ventricular diastolic filling parameters demonstrate normal diastolic  function.  Right Ventricle    The right ventricular cavity size is normal in size. Normal right  ventricular systolic function. TAPSE = 2.1 cm, S' = 10 cm/s.      Left Atrium    Left atrium was not well visualized.    Right Atrium    The right atrium is mildly dilated.    Atrial Septum    No evidence of interatrial shunt by color Doppler.      Aortic Valve    The aortic valve is not well visualized. There is no aortic regurgitation.  There is no aortic stenosis.    Pulmonary Valve    The pulmonic valve is not well visualized. There is no significant pulmonic  regurgitation.    Mitral Valve    The mitral valve is structurally normal. There is mild mitral regurgitation.    Tricuspid Valve    The tricuspid valve is structurally normal. There is mild tricuspid  regurgitation. No pulmonary hypertension with estimated right ventricular  systolic pressure of  24 mmHg.      Aorta    The aortic root is normal in size. The ascending aorta is mildly dilated at  4.2 cm in diameter.    Inferior Vena Cava    IVC diameter and inspiratory sniff response consistent with estimated right  atrial pressure of 8 mmHg (range 5-10).    Pericardium / Pleural Effusion    There is no pericardial effusion.      Measurements  2D Measurements  ----------------------------------------------------------------------  Name                                  Value        Normal  ----------------------------------------------------------------------    Parasternal 2D  ----------------------------------------------------------------------   IVS Diastolic Thickness  (2D)                               1.07 cm     0.60-1.00   LVID Diastole (2D)                 4.64 cm     4.20-5.80    LVIW Diastolic Thickness  (2D)  1.09 cm     0.60-1.05   LVID Systole (2D)                  3.24 cm     2.50-4.00   LVOT Diameter                      2.00 cm                 LA Dimension (2D)                  3.30 cm     3.00-4.10   Prox Asc Ao Diameter               4.20 cm     2.60-3.40     LV Ejection Fraction 2D  ----------------------------------------------------------------------  LV EF (BP MOD)                        59 %         52-72     Apical 2D Dimensions  ----------------------------------------------------------------------   RV Basal Diastolic  Dimension                          3.45 cm     2.50-4.10   LA Volume Index (BP A-L)        19.57 ml/m2   16.00-34.00   RA Area (4C)                     19.70 cm2       <=18.00  M-mode Measurements  ----------------------------------------------------------------------  Name                                 Value        Normal  ----------------------------------------------------------------------    M-Mode  ----------------------------------------------------------------------  TAPSE                              2.14 cm        >=1.60  LVOT/Aortic Valve Doppler Measurements  ----------------------------------------------------------------------  Name                                 Value        Normal  ----------------------------------------------------------------------    LVOT Doppler  ----------------------------------------------------------------------  LVOT Peak Velocity                0.95 m/s                   AoV  Doppler  ----------------------------------------------------------------------  AV Peak Velocity                  1.23 m/s                 AV Mean Gradient                    3 mmHg           <20   AV Area Index (Cont Eq Vel)     1.3 cm2/m2                 AV Area (Cont Eq VTI)  2.47 cm2        >=3.00   AV Area Index (Cont Eq VTI)     1.28 cm2/m2                 AV V1/V2 Ratio                        0.77  RVOT/Pulmonic Valve Doppler Measurements  ----------------------------------------------------------------------  Name                                 Value        Normal  ----------------------------------------------------------------------    PV Doppler  ----------------------------------------------------------------------  PV Peak Velocity                  0.96 m/s  Mitral Valve Measurements  ----------------------------------------------------------------------  Name                                 Value        Normal  ----------------------------------------------------------------------    MV Doppler  ----------------------------------------------------------------------  MV E Peak Velocity                0.84 m/s                 MV A Peak Velocity                1.01 m/s                 MV E/A                                0.83                   MV Annular TDI  ----------------------------------------------------------------------  MV Septal e' Velocity             0.09 m/s                 MV E/e' (Septal)                      9.39        <=8.00   MV Lateral e' Velocity            0.12 m/s                 MV E/e' (Lateral)                     6.76  Tricuspid Valve Measurements  ----------------------------------------------------------------------  Name                                 Value        Normal  ----------------------------------------------------------------------    TV Regurgitation Doppler  ----------------------------------------------------------------------  TR Peak Velocity                   2.27 m/s                 TR Peak Gradient                   21 mmHg  RA Pressure                         3 mmHg           <=3   RV Systolic Pressure               24 mmHg           <36  Aorta / Venous Measurements  ----------------------------------------------------------------------  Name                                 Value        Normal  ----------------------------------------------------------------------    IVC/SVC  ----------------------------------------------------------------------  IVC Diameter (Exp 2D)              1.99 cm        <=2.10      Report Signatures  Finalized by Hughie Closs  MD on 09/12/2022 03:19 PM  Preliminary by Reinaldo Raddle  RDCS, RVT on 09/12/2022 12:17 PM          No results found for this or any previous visit.    Please be sure to review these imaging studies with your primary medical provider.              Home Health Discharge Information    Your doctor has ordered Physical Therapy in-home service(s) for you while you recuperate at home, to assist you in the transition from hospital to home.    The agency that you or your representative chose to provide the service:  Name of Home Health Agency Placement: [facility]]  Phone: [phone]     The above services were set up by:  Lonell Face, RN (Home Health Liaison)   Phone: [phone]    IF YOU HAVE NOT HEARD FROM YOUR HOME YOUR HOME HEALTH AGENCY WITHIN 24-48 HOURS AFTER DISCHARGE PLEASE CALL YOUR AGENCY TO ARRANGE A TIME FOR YOUR FIRST VISIT. FOR ANY SCHEDULING CONCERNS OR QUESTIONS RELATED TO HOME HEALTH, SUCH AS TIME OR DATE PLEASE CONTACT YOUR HOME HEALTH AGENCY AT THE NUMBER LISTED ABOVE.    Additional comments:

## 2022-09-14 NOTE — Treatment Plan (Cosign Needed)
FOUR EYES SKIN ASSESSMENT NOTE    [name] [name]  [birth year]  [id number]  Braden Scale Score: 22            ICU ONLY: Was HAPI Intervention list reviewed with incoming RN/PCT? Yes    Bony Prominences: Check appropriate box; if wound is present enter wound assessment in LDA     Occiput:                 [x] WNL  []  Wound present  Face:                     [x] WNL  []  Wound present  Ears:                      [x] WNL  []  Wound present  Spine:                    [x] WNL  []  Wound present  Shoulders:             [x] WNL  []  Wound present  Elbows:                  [x] WNL  []  Wound present  Sacrum/coccyx:     [x] WNL  []  Wound present  Ischial Tuberosity:  [x] WNL  []  Wound present  Trochanter/Hip:      [x] WNL  []  Wound present  Knees:                   [x] WNL  []  Wound present  Ankles:                   [x] WNL  []  Wound present  Heels:                    [x] WNL  []  Wound present  Other pressure areas:  []  Wound location       Device related: []  Device name:           Other skin related issues, ie tears, rash, etc, document in Integumentary flowsheet    If Wound/Pressure Injury present:  Wound/PI assessment documented in LDA (will be shown below): No    Admitting physician notified: No    Wound consult ordered: No      [name], RN  September 14, 2022  9:42 AM    Second RN/PCT Name:

## 2022-09-14 NOTE — Plan of Care (Incomplete)
Problem: Moderate/High Fall Risk Score >5  Goal: Patient will remain free of falls  Outcome: Progressing  Flowsheets (Taken 09/13/2022 2200)  Moderate Risk (6-13): LOW-Fall Interventions Appropriate for Low Fall Risk     Problem: Compromised Activity/Mobility  Goal: Activity/Mobility Interventions  Outcome: Progressing  Flowsheets (Taken 09/13/2022 2000)  Activity/Mobility Interventions: Pad bony prominences, TAP Seated positioning system when OOB, Promote PMP, Reposition q 2 hrs / turn clock, Offload heels (boots or pillows)     Problem: Compromised Nutrition  Goal: Nutrition Interventions  Outcome: Progressing  Flowsheets (Taken 09/13/2022 2000)  Nutrition Interventions: Discuss nutrition at MDR, I&Os document % meal eaten, Daily weights     Problem: Hemodynamic Status: Cardiac  Goal: Stable vital signs and fluid balance  Outcome: Progressing  Flowsheets (Taken 09/12/2022 1027 by Adair Patter, RN)  Stable vital signs and fluid balance:   Assess signs and symptoms associated with cardiac rhythm changes   Monitor lab values     Problem: Fluid and Electrolyte Imbalance/ Endocrine  Goal: Fluid and electrolyte balance are achieved/maintained  Outcome: Progressing  Flowsheets (Taken 09/12/2022 1027 by Adair Patter, RN)  Fluid and electrolyte balance are achieved/maintained:   Monitor/assess lab values and report abnormal values   Assess and reassess fluid and electrolyte status   Observe for cardiac arrhythmias   Monitor for muscle weakness

## 2022-09-14 NOTE — Progress Notes (Signed)
[place], MD   [facility]  Office phone:  [phone]     [facility] phone [phone]  [facility] phone [phone]    Date:  09/14/2022  9:55 AM    Patient:  [name] [name].  DOB: 1956/01/16.  67 y.o.  male , MRN: [id number]   Attending:  Arley Phenix, MD   Admission date:  09/11/2022     ASSESSMENT:   23 M with DM, HL, pw inferolateral STEMI.     PLAN:   CAD/STEMI. S/p PCI x3 to LCx on 12/20. Cont DAPT, high-intensity statin. Unable to tolerate BB at this time due to bradycardia.  Cath with mild CAD in LAD and RCA.   Outpatient cardiac rehab  NSVT. No events overnight.   Hypotension. Monitor for symptoms.     Stable for d/c from a cardiac perspective. Will arrange for outpt follow up in 1-2 weeks and then f/u with Dr. Tamala Julian for long-term management.     Interval history   No chest pain    MEDICATIONS:    INFUSION MEDS:      SCHEDULED MEDS:     Current Facility-Administered Medications   Medication Dose Route Frequency    aspirin EC  81 mg Oral Daily    atorvastatin  80 mg Oral QHS    heparin (porcine)  5,000 Units Subcutaneous Q8H [place]    insulin lispro  1-3 Units Subcutaneous QHS    insulin lispro  1-5 Units Subcutaneous TID AC    senna-docusate  1 tablet Oral Q12H [facility]    ticagrelor  90 mg Oral Q12H      PRN MEDS:   acetaminophen, calcium carbonate, calcium GLUConate, dextrose **OR** dextrose **OR** dextrose **OR** glucagon (rDNA), melatonin    DIAGNOSTICS:     Reports reviewed:      FLUID BALANCE AND WEIGHT:                                           Last filed weight: Weight: 81 kg (178 lb 9.2 oz)  Weight on Admission: Weight: 77.9 kg (171 lb 11.8 oz)  Net IO Since Admission: 549.46 mL [09/14/22 0955]     Intake/Output Summary (Last 24 hours) at 09/14/2022 0700  Last data filed at 09/14/2022 0200  Gross per 24 hour   Intake 1680 ml   Output 350 ml   Net 1330 ml   Weight change: -1.9 kg (-4 lb 3 oz)    Last 3 Weights for the past 72 hrs (Last 3  readings):   Weight   09/14/22 0400 81 kg (178 lb 9.2 oz)   09/13/22 0400 82.9 kg (182 lb 12.2 oz)   09/12/22 0356 77.9 kg (171 lb 11.8 oz)        PHYSICAL EXAM:                                              BP 108/73   Pulse 70   Temp 98.6 F (37 C) (Oral)   Resp (!) 24   Ht 1.676 m (5\' 6" )   Wt 81 kg (178 lb 9.2 oz)   SpO2 96%   BMI 28.82 kg/m     General Appearance:  Breathing  comfortable, no acute distress  Head:  normocephalic  Eyes:  EOM's intact, nonicteric sclera  Neck:  No carotid bruit or jugular venous distension  Lungs:  Clear to auscultation throughout, no wheezes, rhonchi, good respiratory effort, symmetric chest expansion  Cardiac:  RRR, normal S1, S2, no S3, no S4, no rub, no murmurs   Abdomen:  Soft, non-tender, no rebound or guarding, non-distended, positive bowel sounds  Extremities:  No cyanosis or clubbing. No arthritis or deformities. No LE edema  Vascular: 2+ radial and distal pulses bilaterally  Neurologic:  Alert and oriented x3, mood and affect normal      LABS:   Labs reviewed:  Recent Labs   Lab 09/14/22  0356 09/13/22  0429 09/12/22  0300   WBC 6.69 5.76 7.80   Hgb 11.5* 11.4* 11.3*   Hematocrit 35.9* 34.8* 33.3*   Platelets 177 175 186     Recent Labs   Lab 09/14/22  0356 09/13/22  0429 09/12/22  0426   Sodium 138 138 130*   Potassium 3.9 4.0 3.7   Chloride 110 110 104   CO2 22 22 20    BUN 11.0 7.0* 14.0   Creatinine 0.9 0.8 0.7   eGFR >60.0 >60.0 >60.0   Glucose 87 96 138*   Calcium 7.7* 8.0* 7.6*     Recent Labs   Lab 09/12/22  1942 09/12/22  0300   Troponin I  --  108.90*   hs Troponin-I 57,061.7* >60,000.0*

## 2022-09-14 NOTE — Discharge Summary (Signed)
[facility]      Patient: [name] [name]  Admission Date: 09/11/2022   DOB: 24-Aug-1956  Discharge Date: 09/14/2022    MRN: [id number]  Discharge Attending: A Delane Ginger, MD     Referring Physician: Anson Fret, MD  PCP: Anson Fret, MD       DISCHARGE SUMMARY     Discharge Information   Admission Diagnosis:   ST elevation myocardial infarction (STEMI), unspecified artery    Discharge Diagnosis:   Active Hospital Problems    Diagnosis    ST elevation myocardial infarction (STEMI), unspecified artery    STEMI (ST elevation myocardial infarction)        Admission Condition: guarded  Discharge Condition: stable   Consultants: cardiology   Functional Status: ambulates with out assistance   Discharged to: home with home health       Hospital Course   Presentation History   [name] [name] is a 67 y.o. male with a PMHx of DM,  who presented with chest pain found to to have STEMI on lead ii, iii, avf, v5-v6 T wave inversion on AVR and V2, S/P cardiac cath and 3 DES stent to Lcx with TIMI 3 Revasc. Patient was inially admitted to ICU and stayed for 48 hrs and downgraded after cardiology cleared for downgrade.  Currently patient denies any chest pain, lightheadedness, nausea, vomiting, abdominal pain, fever, chills, diarrhea, dysuria or shortness of breath    See HPI for details.    Hospital Course (3 Days)     Inferiolateral STEMI  Prestented with chest pain  EKG showed STEMI to inferiolater leads  S/p PCI with 3 DES stent to mid Lcx   Troponin peaked so no need to tend per card  Patient was initially admitted to ICU observed for 48 hours and downgraded to telemetry unit, 2D echo showed normal EF [59%], patient unable to tolerate beta-blocker because of hypotension.  Cardiology recommended on discharging the patient with aspirin/Brilinta/high intensity statin.  Patient was walked around the hallway prior to discharge, reported no chest pain or dizziness and vital signs was within normal  limits.  Cardiology cleared for discharge.  Outpatient follow-up with cardiology  Currently on Asa/brilenta, statin and bb on hold because of hypotension        Mildly dilated assending aorta  Outpatient follow up with cardiology.     Type 2 DM  A1c, 5.2  Counseled on continued dietary management       Anemia  Anemia workup showed iron deficiency anemia  I have informed patient as well as son and wife at the bedside to follow-up in gastroenterology for interval colonoscopy given patient had never had colonoscopy before.  Arranged outpatient follow-up with gastroenterology as well.    Procedures/Imaging:   Upon my review: No results found.          Progress Note/Physical Exam at Discharge     Subjective:Patient is stable for discharge.    Vitals:    09/14/22 0936 09/14/22 1150 09/14/22 1248 09/14/22 1249   BP:  101/63 103/67 109/64   Pulse: 70 62 71 72   Resp:  17     Temp:  97.9 F (36.6 C) 97.9 F (36.6 C)    TempSrc:  Oral Oral    SpO2:  98% 95% 96%   Weight:       Height:               General: NAD, AAOx3  HEENT: Sclera anicteric, no  conjunctival injection, OP: Clear, MMM  Neck: Supple, FROM, no LAD  Cardiovascular: RRR, no m/r/g  Lungs: CTAB, no w/r/r  Abdomen: Soft, +BS, NT/ND, no masses, no g/r  Extremities: No C/C/E  Skin: No rashes or lesions noted  Neuro: CN 2-12 grossly intact; No Focal neurological deficits       Diagnostics     Labs/Studies Pending at Discharge: No    Last Labs   Recent Labs   Lab 09/14/22  0356 09/13/22  0429 09/12/22  0300   WBC 6.69 5.76 7.80   RBC 3.95* 3.88* 3.76*   Hgb 11.5* 11.4* 11.3*   Hematocrit 35.9* 34.8* 33.3*   MCV 90.9 89.7 88.6   Platelets 177 175 186       Recent Labs   Lab 09/14/22  0356 09/13/22  0429 09/12/22  0426 09/11/22  1843   Sodium 138 138 130* 136   Potassium 3.9 4.0 3.7 3.6   Chloride 110 110 104 101   CO2 22 22 20 25    BUN 11.0 7.0* 14.0 14.0   Creatinine 0.9 0.8 0.7 0.8   Glucose 87 96 138* 144*   Calcium 7.7* 8.0* 7.6* 9.2   Magnesium 1.8 2.0 1.8  --         Microbiology Results (last 15 days)       Procedure Component Value Units Date/Time    MRSA culture [[id number]]     Order Status: Canceled Specimen: Culturette from Nares     MRSA culture [[id number]]     Order Status: Canceled Specimen: Culturette from Throat              Patient Instructions   Discharge Diet: Heart healthy and carb controlled diet  Discharge Activity: As tolerated    Follow Up Appointment:   Follow-up Information       Sioco, Crissie Sickles III, MD Follow up in 3 day(s).    Specialty: Family Medicine  Contact information:  [address]  [address] [address]  [phone]               Nelda Bucks, MD Follow up in 1 week(s).    Specialties: Cardiology, Interventional Cardiology  Contact information:  [address]  [place] [address]  [phone]               Launa Flight, MD Follow up.    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  Contact information:  [address] [place]  [place] [address]  [phone]                              Discharge Medications:     Medication List        START taking these medications      aspirin EC 81 MG EC tablet  Take 1 tablet (81 mg) by mouth daily  Start taking on: September 15, 2022     atorvastatin 80 MG tablet  Commonly known as: LIPITOR  Take 1 tablet (80 mg) by mouth nightly     ferrous sulfate 324 (65 FE) MG Tbec  Take 1 tablet (324 mg) by mouth every morning with breakfast     ticagrelor 90 MG Tabs  Commonly known as: BRILINTA  Take 1 tablet (90 mg) by mouth every 12 (twelve) hours  CONTINUE taking these medications      albuterol (2.5 MG/3ML) 0.083% nebulizer solution  Commonly known as: PROVENTIL  Take 3 mLs (2.5 mg) by nebulization every 4 (four) hours as needed for Wheezing or Shortness of Breath (cough)     benzonatate 100 MG capsule  Commonly known as: TESSALON  Take 1 capsule (100 mg) by mouth 3 (three) times daily as needed  for Cough     fluticasone 50 MCG/ACT nasal spray  Commonly known as: FLONASE  2 sprays by Nasal route daily for 7 days     Nebulizer Misc  Dispense one nebulizer machine to include mask and tubing     vitamin D (ergocalciferol) 50000 UNIT Caps  Commonly known as: DRISDOL  Take 1 capsule (50,000 Units) by mouth once a week for the next 8 weeks.  Start taking on: October 05, 2022               Where to Get Your Medications        These medications were sent to [id number] - [place], [place] - [address]  [address], [place] [address]      Phone: [phone]   aspirin EC 81 MG EC tablet  atorvastatin 80 MG tablet  ferrous sulfate 324 (65 FE) MG Tbec  ticagrelor 90 MG Tabs          Time spent examining patient, discussing with patient/family regarding hospital course, chart review, reconciling medications and discharge planning: 35 minutes.    Signed,  Gayleen Orem, MD    1:24 PM 09/14/2022

## 2022-09-14 NOTE — Discharge Summary -  Nursing (Signed)
NURSING SHIFT NOTE     Patient: [name] [name]  Day: 3      SHIFT EVENTS     Patient Discharge   Per Attending's order.  Vital signs were stable and NSR on the telemetry monitor.  No complaints of N/V/D or any pain.   PIV line & telemetry box/ wires were removed.  Discharge education handout/packet was given to take home.  Patient verbalized and demonstrated understanding confirmed by teach back method and does not have any further questions for the treatment team.   All personal belongings now in hand including AVS.  New prescriptions sent to pharmacy.  Patient transported via wheelchair to the lobby.  His son will drive him home.  All questions and concerns addressed.          ASSESSMENT     Changes in assessment from patient's baseline this shift:    Neuro: No  CV: No  Pulm: No  Peripheral Vascular: No  HEENT: No  GI: No  BM during shift: yes, Last BM: Last BM Date: 09/14/22  GU: No   Integ: No  MS: No    Pain: None  Pain Interventions: None  Medications Utilized: n/a    Mobility: walk out the room of 350 feet           VITAL SIGNS     Vitals:    09/14/22 1249   BP: 109/64   Pulse: 72   Resp:    Temp:    SpO2: 96%       Temp  Min: 97.5 F (36.4 C)  Max: 98.6 F (37 C)  Pulse  Min: 57  Max: 73  Resp  Min: 17  Max: 26  BP  Min: 87/56  Max: 114/60  SpO2  Min: 95 %  Max: 98 %      Intake/Output Summary (Last 24 hours) at 09/14/2022 1337  Last data filed at 09/14/2022 0800  Gross per 24 hour   Intake 630 ml   Output 350 ml   Net 280 ml

## 2022-09-14 NOTE — Progress Notes (Deleted)
DISCHARGE: Patient will discharge to home today.  Referral for [facility] PT completed.  Patient will arrange his own transport.       09/14/22 1350   Discharge Disposition   Patient preference/choice provided? Yes   Physical Discharge Disposition Home, Home Health   Name of [place] Placement Other (comment box)  (Referral for PT completed.)   Mode of Transportation Car   Patient/Family/POA notified of transfer plan Yes   Patient agreeable to discharge plan/expected d/c date? Yes   Family/POA agreeable to discharge plan/expected d/c date? Yes   Bedside nurse notified of transport plan? Yes   [facility] PT/OT/ST   CM Interventions   Follow up appointment scheduled? No   Reason no follow up scheduled? Other (comment);Family to schedule  (Patient will schedule as advised.)   Notified MD? Yes   Multidisciplinary rounds/family meeting before d/c? Yes   Medicare Checklist   Is this a Medicare patient? No     Memory Argue, MSW, ACM-SW  Social Worker Case Manager [facility]  [phone]

## 2022-09-14 NOTE — Progress Notes (Signed)
DISCHARGE: Patient will discharge to home today.  Referral for [facility] PT completed.  Patient will arrange his own transport.        09/14/22 1350   Discharge Disposition   Patient preference/choice provided? Yes   Physical Discharge Disposition Home, Home Health   Name of [place] Placement Other (comment box)  (Referral for PT completed.)   Mode of Transportation Car   Patient/Family/POA notified of transfer plan Yes   Patient agreeable to discharge plan/expected d/c date? Yes   Family/POA agreeable to discharge plan/expected d/c date? Yes  (DCP discussed with patient's spouse present in the room. CM informed that she/patient will be contacted by the [facility] agency to schedule the PT visits.)   Bedside nurse notified of transport plan? Yes   [facility]/OT/ST   CM Interventions   Follow up appointment scheduled? No   Reason no follow up scheduled? Other (comment);Family to schedule  (Patient/Spouse will schedule as advised.)   Notified MD? Yes   Multidisciplinary rounds/family meeting before d/c? Yes   Medicare Checklist   Is this a Medicare patient? No     Memory Argue, MSW, ACM-SW  Social Worker Case Manager [facility]  [phone]

## 2022-09-14 NOTE — Progress Notes (Addendum)
Start [facility] Note  Home Health Referral    Referral from [facility]. (Case Manager) for home health care upon discharge.    By [employer], the patient has the right to freely choose a home care provider.    A company of the patients choosing. We have supplied the patient with a listing of providers in your area who asked to be included and participate in Medicare.   [facility], formerly [facility], a home care agency that provides adult home care services and participates in Medicare   The preferred provider of your insurance company. Choosing a home care provider other than your insurance company's preferred provider may affect your insurance coverage.      Home Health Discharge Information    Your doctor has ordered Physical Therapy in-home service(s) for you while you recuperate at home, to assist you in the transition from hospital to home.    The agency that you or your representative chose to provide the service:  Name of Home Health Agency Placement: [facility]]  Phone: [phone]     The above services were set up by:  Lonell Face, RN (Home Health Liaison)   Phone: [phone]    IF YOU HAVE NOT HEARD FROM YOUR HOME YOUR HOME HEALTH AGENCY WITHIN 24-48 HOURS AFTER DISCHARGE PLEASE CALL YOUR AGENCY TO ARRANGE A TIME FOR YOUR FIRST VISIT. FOR ANY SCHEDULING CONCERNS OR QUESTIONS RELATED TO HOME HEALTH, SUCH AS TIME OR DATE PLEASE CONTACT YOUR HOME HEALTH AGENCY AT THE NUMBER LISTED ABOVE.    Additional comments:        START PATIENT REGISTRATION INFORMATION     Order Information  Order Signing Physician: Gayleen Orem, MD    Service Ordered RN ?: No    Service Ordered PT ?: Yes  Service Ordered OT ?: No    Service Ordered ST ?: No    Service Ordered MSW?: No    Service Ordered HHA?: No    Following Physician: Oswaldo Done, MD  Following Physician Phone: [phone]  Overseeing Physician: N/A  (Required for Residents only)   Agreeable to Follow?:  Yes  Spoke with: N/A  Date/Time of Call: 09/14/22 2:50 PM      Care Coordination   Same Day [facility]?: no  Primary Care Physician:Jose Leonardo Lincoln Maxin, MD  Primary Care Physician Phone:[phone]  Primary Care Physician Address: [address] [place] [address]  PCP NPI: [id number]  Visit Instructions: N/A  Service Discharge Location Type: Home  Service Facility Name: N/A  Service Floor Facility: N/A  Service Room No: N/A    Demographics  Patient Last Name: Sullivant   Patient First Name: Glade Lloyd  Language/Communication Barrier: No  Service Address: [address]  [place] [address]   Service Home Phone: [phone] (home)   Other phone numbers:    Telephone Information:   Mobile [phone]     Emergency Contact: Extended Emergency Contact Information  Primary Emergency Contact: Ledlow,[name]  Mobile Phone: [phone]  Relation: Spouse  Interpreter needed? Yes    Admission Information  Admit Date: 09/11/2022  Patient Status at discharge: Inpatient  Admitting Diagnosis: STEMI (ST elevation myocardial infarction) [I21.3]  ST elevation myocardial infarction (STEMI), unspecified artery [I21.3]     Caregiver Information  Caregiver First Name: N/A  Caregiver Last Name: N/A  Caregiver Relationship to Patient: N/A  Caregiver Phone Number: N/A  Caregiver Notes: N/A  Facilities manager Information  Primary Subscriber:   Primary Subscriber Relation To Guarantor:   Primary Payor:   Primary Plan:   Primary Group #:    Primary Subscriber ID:    Primary Subscriber DOB:   Secondary Insurance Information  Secondary Subscriber:   Secondary Subscriber Relation To Guarantor:   Secondary Payor:   Secondary Plan:   Secondary Group #:   Secondary Subscriber ID:   Secondary Subscriber DOB:   HITECH  NO    END PATIENT REGISTRATION INFORMATION       Diagnosis:  STEMI (ST elevation myocardial infarction) [I21.3]  ST elevation myocardial infarction (STEMI), unspecified artery  [I21.3]    Start [facility] Summary        Additional Comments:       End [facility] Summary     Discharge Date:  09/14/22    Referral Source  Signed by: Jannett Celestine, RN  Date Time: 09/14/22 2:50 PM             [facility]        Patient Name: KHRIS, JANSSON     MRN: [id number]      CSN: [id number]         [place] #   [id number] Patient Class   Inpatient Service  Cardiology Accommodation Code  Semi-Private      Admission Information     Admitting Physician:  Attending Physician: Morene Rankins, DO  Yousuf, Munib A, MD [address]  [address] L&D Status      Admitting Diagnosis: STEMI (ST elevation myocardial infarction); ST elevation myocardial infarc* Room / Bed  A2120/A2120-A L&D - Last Menstrual Cycle      Chief Complaint:       Admit Type:  Admit Date/Time:  Discharge Date/Time: Information Not Available  09/11/2022 / 1934   /  Length of Stay: 3 Days    L&D EDD   Estimated Date of Delivery: None noted.      Patient Information              Home Address: [address]  [place] [address] Employer:  Employer Address:       ,     Main Phone: [phone] Employer Phone:     SSN: [id number]       DOB: [birth year] (89 yrs)       Sex: Male Primary Care Physician: DeRidder   Marital Status: Married Referring Physician:       No ref. provider found   Race: Unavailable       Ethnicity: Not of Hispanic/Latino/S*       Emergency Contacts  Name Home Phone Work Phone Mobile Phone Relationship ARSLAN, KIER     [phone] Spouse No         Guarantor Information     Guarantor Name: ENNIO, HOUP Guarantor ID: [id number]   Guarantor Relationship to Pt: Self Guarantor Type: Personal/Family   Guarantor DOB:    1956/01/04 Billing Indicator:        Guarantor Address: [address]   [place], [place] [address]          Guarantor Home Phone: [phone] Guarantor Employer:        Guarantor Work Phone: [phone] Guarantor Emp Phone:                     [employer]  Insurance Name: [facility] COMMUNITY PLAN CARDI* Subscriber Name: [facility]   Insurance Address:    [address]  [place], [place] [address] Subscriber DOB: 06/18/1956      Subscriber ID: [id number]   Insurance Phone: [phone] Pt Relationship to Sub:   Self   Insurance ID:         Group Name:   Preauthorization #: [id number]   Group #:   Preauthorization Days:        Secondary Insurance     Insurance Name: - Subscriber Name:     Insurance Address:       ,   Statistician DOB:        Subscriber ID:     Press photographer:   Pt Relationship to Sub:       Insurance ID:         Group Name:   Preauthorization #:     Group #:   Preauthorization Days:        [employer] Name: Scientist, clinical (histocompatibility and immunogenetics) Name:     Community education officer Address:       ,   Statistician DOB:        Subscriber ID:     Press photographer:   Pt Relationship to Sub:       Insurance ID:         Group Name:   Preauthorization #:     Group #:   Preauthorization Days:           09/14/2022 2:34 PM          Home Health face-to-face (FTF) Encounter (Order [id number])  Consult  Date: 09/14/2022 Department: [facility] [address] Ordering/Authorizing: Gayleen Orem, MD     Order Information    Order Date/Time Release Date/Time Start Date/Time End Date/Time   09/14/22 02:33 PM None 09/14/22 02:31 PM 09/14/22 02:31 PM     Order Details    Frequency Duration Priority Order Class   Once 1  occurrence Routine Hospital Performed     Standing Order Information    Remaining Occurrences Interval Last Released     0/1 Once 09/14/2022              Provider Information    Ordering User Ordering Provider Authorizing Provider   Lonell Face, RN Don Broach, Maris Berger, MD Gayleen Orem, MD   Attending Provider(s) Admitting Provider PCP   Nelda Bucks, MD; Nash Shearer, DO; Velia Meyer, MD; Cline Cools, MD; Gayleen Orem, MD Nash Shearer, DO Sioco, Emogene Morgan, MD     Verbal Order Info    Action Created on Order Mode Entered by Responsible Provider Signed by  Signed on   Ordering 09/14/22 1433 Telephone with Susie Cassette, RN Gayleen Orem, MD             Comments    PCP to follow: Oswaldo Done, MD-- [phone]      Home PT required for gait and balance training, strengthening, mobility, fall prevention, and ADL training.    ST elevation myocardial infarction (STEMI), unspecified artery I21.3  STEMI (ST elevation myocardial infarction) I21.3                Home Health face-to-face (FTF) Encounter: Patient Communication     Not Released  Not seen         Order Questions    Question Answer   Date I saw the patient face-to-face:  09/14/2022   Evidence this patient is homebound because: B.  Profound weakness, poor balance/unsteady gait d/t illness/treatment/procedure    C.  Decreased endurance, strength, ROM, cadence, safety/judgment during mobility    J.  Advanced age with frailty factors affecting safe ambulation & needs supervision   Medical conditions that necessitate Home Health care: C.  Risk for complication/infection/pain requiring follow up and monitoring    F.  New diagnosis & treatment requiring follow up monitoring and management    E.  Exacerbation of disease requiring follow up monitoring   Per clinical findings, following services are medically necessary: PT   Clinical findings that support the need for Physical Therapy. PT will A.  Evaluate and treat functional impairment and improve mobility    C.  Educate on weight bearing status, stair/gait training, balance & coordination    D.  Provide services to help restore function, mobility, and releive pain    E.  Educate on functional mobility; bed, chair, sit, stand and transfer activities    F.  Perform home safety assessment & develop safe in home exercise program    G.  Implement activities to improve stance time, cadence & step length    H.  Educate on the safe use of assistive device/ durable medical equipment    I.  Instruct on restorative activities to restore ability to perform  ADL    J.  Educate on wheelchair mobility, ramp negotiation, and therapeutic exercises   Other (please specify) See comments for orders: PT                    Process Instructions    Please select Home Care Services medically necessary.    Based on the above findings, I certify that this patient is confined to the home and needs intermittent skilled nursing care, physical therapry and / or speech therapy or continues to need occupational therapy. The patient is under my care, and I have initiated the establishment of the plan of care. This patient will be followed by a physician who will periodically review the plan of care.     Collection Information            Consult Order Info    ID Description Priority Start Date Start Time   [id number] Home Health face-to-face (FTF) Encounter Routine 09/14/2022  2:31 PM   Provider Specialty Referred to   ______________________________________ _____________________________________                         Verbal Order Info    Action Created on Order Mode Entered by Responsible Provider Signed by Signed on   Ordering 09/14/22 1433 Telephone with Susie Cassette, RN Gayleen Orem, MD             Patient Information    Patient Name  Aidenjames, Heckmann Legal Sex  Male DOB  Apr 30, 1956       Reprint Order Requisition    Home Health face-to-face (FTF) Encounter (Order #[id number]) on 09/14/22       Additional Information    Associated Reports External References   Priority and Order Details [employer]         End [facility] Note

## 2022-09-14 NOTE — Plan of Care (Signed)
Patient AxO x4. VSS. No reports of pain, follows commands. HR 70s, NSR. Patient transferred to Unit [address], all belongings taken with patient.     Problem: Moderate/High Fall Risk Score >5  Goal: Patient will remain free of falls  Outcome: Progressing  Flowsheets (Taken 09/14/2022 0941)  Moderate Risk (6-13):   LOW-Fall Interventions Appropriate for Low Fall Risk   LOW-Anticoagulation education for injury risk     Problem: Compromised Activity/Mobility  Goal: Activity/Mobility Interventions  Outcome: Progressing  Flowsheets (Taken 09/14/2022 0941)  Activity/Mobility Interventions: Pad bony prominences, TAP Seated positioning system when OOB, Promote PMP, Reposition q 2 hrs / turn clock, Offload heels (boots or pillows)     Problem: Compromised Nutrition  Goal: Nutrition Interventions  Outcome: Progressing  Flowsheets (Taken 09/14/2022 0941)  Nutrition Interventions: Discuss nutrition at MDR, I&Os document % meal eaten, Daily weights     Problem: Hemodynamic Status: Cardiac  Goal: Stable vital signs and fluid balance  Outcome: Progressing  Flowsheets (Taken 09/14/2022 0941)  Stable vital signs and fluid balance:   Assess signs and symptoms associated with cardiac rhythm changes   Monitor lab values     Problem: Inadequate Tissue Perfusion  Goal: Adequate tissue perfusion will be maintained  Outcome: Progressing  Flowsheets (Taken 09/14/2022 0941)  Adequate tissue perfusion will be maintained:   Monitor/assess lab values and report abnormal values   Monitor/assess neurovascular status (pulses, capillary refill, pain, paresthesia, paralysis, presence of edema)   Monitor/assess for signs of VTE (edema of calf/thigh redness, pain)   Monitor for signs and symptoms of a pulmonary embolism (dyspnea, tachypnea, tachycardia, confusion)     Problem: Fluid and Electrolyte Imbalance/ Endocrine  Goal: Fluid and electrolyte balance are achieved/maintained  Outcome: Progressing  Flowsheets (Taken 09/14/2022 0941)  Fluid and electrolyte balance are  achieved/maintained:   Monitor/assess lab values and report abnormal values   Observe for cardiac arrhythmias   Assess and reassess fluid and electrolyte status   Monitor for muscle weakness  Goal: Adequate hydration  Outcome: Progressing  Flowsheets (Taken 09/14/2022 0941)  Adequate hydration:   Assess mucus membranes, skin color, turgor, perfusion and presence of edema   Assess for peripheral, sacral, periorbital and abdominal edema   Monitor and assess vital signs and perfusion

## 2022-09-15 NOTE — Progress Notes (Signed)
[facility]                          The patient speaks [name] [name], was counseled on the importance of the interpreter.  I counseled patient that I need to be days available for translation, he declined emphatically stating he wanted his daughter, [name] [name] to interprets this encounter.  I explained the medicolegal implications of a relative being a translator, the patient expressed understanding and reiterated that he wanted his daughter to translate.    Post Discharge / Transitional Care Management Visit:  SUBJECTIVE  [name] [name] is a 67 y.o. male who presents today for Post Discharge/ Transitional  Care Management.  He was discharged from [facility] on 09/14/2022.    Summary of Care:  I reviewed the discharge summary from the hospital admission: Yes    In brief, a summary of the hospitalization is as follows:   "[name] [name] is a 67 y.o. male with a PMHx of DM,  who presented with chest pain found to to have STEMI on lead ii, iii, avf, v5-v6 T wave inversion on AVR and V2, S/P cardiac cath and 3 DES stent to Lcx with TIMI 3 Revasc.  Hospital Course (3 Days)   Inferiolateral STEMI  Prestented with chest pain  EKG showed STEMI to inferiolater leads  S/p PCI with 3 DES stent to mid Lcx   Troponin peaked so no need to trend per card  Patient was initially admitted to ICU observed for 48 hours and downgraded to telemetry unit, 2D echo showed normal EF [59%], patient unable to tolerate beta-blocker because of hypotension.  Cardiology recommended on discharging the patient with aspirin/Brilinta/high intensity statin.  Patient was walked around the hallway prior to discharge, reported no chest pain or dizziness and vital signs was within normal limits. Cardiology cleared for discharge.  Outpatient follow-up with cardiology  Currently on Asa/brilenta, statin and bb on hold because of hypotension   Mildly dilated assending aorta  Outpatient follow up with cardiology.  Type 2 DM  A1c,  5.2  Counseled on continued dietary management  Anemia  Anemia workup showed iron deficiency anemia  I have informed patient as well as son and wife at the bedside to follow-up in gastroenterology for interval colonoscopy given patient had never had colonoscopy before.  Arranged outpatient follow-up with gastroenterology as well."    Since the hospitalization: The patient reports he is not back to his baseline however he denies chest pain or discomfort that led him to recent admission.  The patient reports he has some pain and tingling in his right forearm that has been getting better since discharge.  Of note, this arm is the port of entry for PCI.    During the interview, the patient and daughter denied [emphatically] any background history of diabetes mellitus stating that the patient was never diagnosed.  A brief chart review revealed HbA1c has been within normal limits since May 2023.   Latest Reference Range & Units Most Recent 01/11/22 14:38 08/16/22 09:31   Hemoglobin A1C 4.6 - 5.6 % 5.2  09/12/22 03:00 5.1 5.4   Today, the patient denies any chest pain, lightheadedness, nausea, vomiting, abdominal pain, fever, chills, diarrhea, dysuria or shortness of breath.    Additional medical updates/chronic care management:  Follow up with Cardiologist not yet scheduled, patient and his daughter were unaware of follow-up with gastroenterologist that was recommended.  Patient reports he uses hearing aids, and they  have been bothersome in recent times and not working properly.  Hearing aids were giving to him when he lived in [place] "years ago".  Patient needs referral to an ENT for further evaluation and reassessment.    PFSH:  Patient's allergies, past medical, surgical, social and family histories were reviewed and  updated as appropriate in Epic. Yes    MEDICATIONS: A medication reconciliation was performed: Yes  Current Outpatient Medications   Medication Sig Dispense Refill    albuterol (PROVENTIL) (2.5  MG/3ML) 0.083% nebulizer solution Take 3 mLs (2.5 mg) by nebulization every 4 (four) hours as needed for Wheezing or Shortness of Breath (cough) 75 mL 0    aspirin EC 81 MG EC tablet Take 1 tablet (81 mg) by mouth daily 30 tablet 1    atorvastatin (LIPITOR) 80 MG tablet Take 1 tablet (80 mg) by mouth nightly 30 tablet 1    benzonatate (TESSALON) 100 MG capsule Take 1 capsule (100 mg) by mouth 3 (three) times daily as needed for Cough 15 capsule 0    ferrous sulfate 324 (65 FE) MG Tablet Delayed Response Take 1 tablet (324 mg) by mouth every morning with breakfast 60 tablet 0    fluticasone (FLONASE) 50 MCG/ACT nasal spray 2 sprays by Nasal route daily for 7 days 11.1 mL 0    Nebulizer Misc Dispense one nebulizer machine to include mask and tubing 1 each 0    ticagrelor (BRILINTA) 90 MG Tab Take 1 tablet (90 mg) by mouth every 12 (twelve) hours 60 tablet 1    [START ON 10/05/2022] vitamin D, ergocalciferol, (DRISDOL) 50000 UNIT Cap Take 1 capsule (50,000 Units) by mouth once a week for the next 8 weeks. 8 capsule 0     No current facility-administered medications for this visit.       Past Surgical History:   Procedure Laterality Date    NOSE SURGERY       Social History     Tobacco Use    Smoking status: Never    Smokeless tobacco: Never   Substance Use Topics    Alcohol use: Not Currently     No family history on file.  REVIEW OF SYSTEMS  Constitutional:  Negative for chills, fatigue and fever.   HENT:  Negative for congestion, hearing loss, rhinorrhea, sinus pressure and sore throat.    Eyes:  Negative for pain and visual disturbance.   Respiratory:  Negative for apnea, cough, choking, chest tightness, shortness of breath, wheezing and stridor.    Cardiovascular:  Negative for chest pain, palpitations and leg swelling.   Gastrointestinal:  Negative for abdominal distention, abdominal pain, diarrhea, nausea and vomiting.   Endocrine: Negative for polydipsia, polyphagia and polyuria.   Genitourinary:  Negative for  difficulty urinating, dysuria and frequency.   Musculoskeletal:  Negative for arthralgias, back pain, gait problem and myalgias.   Skin:  Negative for pallor and rash.   Neurological:  Negative for weakness, light-headedness, numbness and headaches.   Psychiatric/Behavioral:  Negative for agitation and sleep disturbance.       OBJECTIVE  Vitals:    09/16/22 1327   BP: 103/69   BP Site: Left arm   Patient Position: Sitting   Cuff Size: Medium   Pulse: 68   Resp: 16   Temp: 98.9 F (37.2 C)   TempSrc: Temporal   SpO2: 98%   Weight: 75.8 kg (167 lb)   Height: 1.676 m (5\' 6" )     There is no height  or weight on file to calculate BMI.  Wt Readings from Last 3 Encounters:   09/14/22 81 kg (178 lb 9.2 oz)   09/11/22 77.9 kg (171 lb 11.8 oz)   08/16/22 75.6 kg (166 lb 9.6 oz)       Physical examination  General Examination:   Vitals and nursing note reviewed.   Constitutional:       General: He is not in acute distress.     Appearance: Normal appearance.   HENT:      Head: Normocephalic and atraumatic.      Right Ear: Tympanic membrane, ear canal and external ear normal.      Left Ear: Tympanic membrane, ear canal and external ear normal.      Nose: Nose normal.      Mouth/Throat:      Mouth: Mucous membranes are moist.      Pharynx: Oropharynx is clear.   Eyes:      Extraocular Movements: Extraocular movements intact.      Pupils: Pupils are equal, round, and reactive to light.   Cardiovascular:      Rate and Rhythm: Normal rate and regular rhythm.      Pulses: Normal pulses.      Heart sounds: Normal heart sounds.   Pulmonary:      Effort: Pulmonary effort is normal.      Breath sounds: Normal breath sounds.   Abdominal:      General: There is no distension.      Palpations: Abdomen is soft. There is no mass.      Tenderness: There is no abdominal tenderness. There is no right CVA tenderness or left CVA tenderness.   Musculoskeletal:         General: No tenderness. Normal range of motion.      Cervical back: Normal range  of motion and neck supple.      Right lower leg: No edema.      Left lower leg: No edema.   Skin:     General: Skin is warm and dry.      Capillary Refill: Capillary refill takes less than 2 seconds.      Coloration: Skin is not jaundiced or pale.   Neurological:      General: No focal deficit present.      Mental Status: He is alert and oriented to person, place, and time.   Psychiatric:         Mood and Affect: Mood normal.         Behavior: Behavior normal.       RECENT STUDIES & REPORTS:  I have reviewed the following lab, x rays, ekg, echo, cath, old  records, discharge information:    Results for orders placed or performed during the hospital encounter of 09/11/22   CBC without differential   Result Value Ref Range    WBC 7.80 3.10 - 9.50 x10 3/uL    Hgb 11.3 (L) 12.5 - 17.1 g/dL    Hematocrit 28.4 (L) 37.6 - 49.6 %    Platelets 186 142 - 346 x10 3/uL    RBC 3.76 (L) 4.20 - 5.90 x10 6/uL    MCV 88.6 78.0 - 96.0 fL    MCH 30.1 25.1 - 33.5 pg    MCHC 33.9 31.5 - 35.8 g/dL    RDW 13 11 - 15 %    MPV 10.6 8.9 - 12.5 fL    Nucleated RBC 0.0 0.0 - 0.0 /100 WBC  Absolute NRBC 0.00 0.00 - 0.00 x10 3/uL   Hemoglobin A1C   Result Value Ref Range    Hemoglobin A1C 5.2 4.6 - 5.6 %    Average Estimated Glucose 102.5 mg/dL   Calcium, ionized   Result Value Ref Range    Calcium, Ionized 2.05 (L) 2.30 - 2.58 mEq/L   High Sensitivity Troponin-I (Every 6 hours)   Result Value Ref Range    hs Troponin-I >60,000.0 (AA) SEE BELOW ng/L   Lipid panel   Result Value Ref Range    Cholesterol 137 0 - 199 mg/dL    Triglycerides 55 34 - 149 mg/dL    HDL 38 (L) 40 - 1,610 mg/dL    LDL Calculated 88 0 - 99 mg/dL    VLDL Calculated 11 10 - 40 mg/dL    Cholesterol / HDL Ratio 3.6 See Below Index   Troponin I - restricted ordering   Result Value Ref Range    Troponin I 108.90 (H) 0.00 - 0.03 ng/mL   Lactic Acid   Result Value Ref Range    Lactic Acid 0.7 0.2 - 2.0 mmol/L   Basic Metabolic Panel   Result Value Ref Range    Glucose 138 (H) 70  - 100 mg/dL    BUN 96.0 9.0 - 45.4 mg/dL    Creatinine 0.7 0.5 - 1.5 mg/dL    Calcium 7.6 (L) 8.5 - 10.5 mg/dL    Sodium 098 (L) 119 - 145 mEq/L    Potassium 3.7 3.5 - 5.3 mEq/L    Chloride 104 99 - 111 mEq/L    CO2 20 17 - 29 mEq/L    Anion Gap 6.0 5.0 - 15.0    eGFR >60.0 >=60 mL/min/1.73 m2   Hepatic function panel (LFT)   Result Value Ref Range    Bilirubin, Total 0.6 0.2 - 1.2 mg/dL    Bilirubin Direct 0.3 0.0 - 0.5 mg/dL    Bilirubin Indirect 0.3 0.2 - 1.0 mg/dL    AST (SGOT) 147 (H) 5 - 41 U/L    ALT 37 0 - 55 U/L    Alkaline Phosphatase 47 37 - 117 U/L    Protein, Total 5.1 (L) 6.0 - 8.3 g/dL    Albumin 3.0 (L) 3.5 - 5.0 g/dL    Globulin 2.1 2.0 - 3.6 g/dL    Albumin/Globulin Ratio 1.4 0.9 - 2.2   Magnesium   Result Value Ref Range    Magnesium 1.8 1.6 - 2.6 mg/dL   Phosphorus   Result Value Ref Range    Phosphorus 3.0 2.3 - 4.7 mg/dL   High Sensitivity Troponin-I   Result Value Ref Range    hs Troponin-I 57,061.7 (AA) SEE BELOW ng/L   Basic Metabolic Panel   Result Value Ref Range    Glucose 96 70 - 100 mg/dL    BUN 7.0 (L) 9.0 - 82.9 mg/dL    Creatinine 0.8 0.5 - 1.5 mg/dL    Calcium 8.0 (L) 8.5 - 10.5 mg/dL    Sodium 562 130 - 865 mEq/L    Potassium 4.0 3.5 - 5.3 mEq/L    Chloride 110 99 - 111 mEq/L    CO2 22 17 - 29 mEq/L    Anion Gap 6.0 5.0 - 15.0    eGFR >60.0 >=60 mL/min/1.73 m2   CBC without differential   Result Value Ref Range    WBC 5.76 3.10 - 9.50 x10 3/uL    Hgb 11.4 (L) 12.5 - 17.1 g/dL  Hematocrit 34.8 (L) 37.6 - 49.6 %    Platelets 175 142 - 346 x10 3/uL    RBC 3.88 (L) 4.20 - 5.90 x10 6/uL    MCV 89.7 78.0 - 96.0 fL    MCH 29.4 25.1 - 33.5 pg    MCHC 32.8 31.5 - 35.8 g/dL    RDW 13 11 - 15 %    MPV 10.7 8.9 - 12.5 fL    Nucleated RBC 0.0 0.0 - 0.0 /100 WBC    Absolute NRBC 0.00 0.00 - 0.00 x10 3/uL   Magnesium   Result Value Ref Range    Magnesium 2.0 1.6 - 2.6 mg/dL   Reticulocytes   Result Value Ref Range    Reticulocyte Count Automated 1.3 0.8 - 2.3 %    Reticulocyte Count Absolute  0.0549 0.0220 - 0.1420 x10 6/uL    Immature Retic Fract 6.8 1.2 - 15.6 %    Reticulocyte Hemoglobin 33.3 28.4 - 40.2 pg   Transferrin   Result Value Ref Range    Transferrin 206 174 - 382 mg/dL   Vitamin Z61   Result Value Ref Range    Vitamin B-12 644 211 - 911 pg/mL   Ferritin   Result Value Ref Range    Ferritin 234.70 21.80 - 274.70 ng/mL   IRON PROFILE   Result Value Ref Range    Iron 29 (L) 41 - 168 ug/dL    UIBC 096 045 - 409 ug/dL    TIBC 811 (L) 914 - 782 ug/dL    Iron Saturation 13 (L) 15 - 50 %   Hemolysis index   Result Value Ref Range    Hemolysis Index 17 0 - 24 Index   Basic Metabolic Panel   Result Value Ref Range    Glucose 87 70 - 100 mg/dL    BUN 95.6 9.0 - 21.3 mg/dL    Creatinine 0.9 0.5 - 1.5 mg/dL    Calcium 7.7 (L) 8.5 - 10.5 mg/dL    Sodium 086 578 - 469 mEq/L    Potassium 3.9 3.5 - 5.3 mEq/L    Chloride 110 99 - 111 mEq/L    CO2 22 17 - 29 mEq/L    Anion Gap 6.0 5.0 - 15.0    eGFR >60.0 >=60 mL/min/1.73 m2   CBC and differential   Result Value Ref Range    WBC 6.69 3.10 - 9.50 x10 3/uL    Hgb 11.5 (L) 12.5 - 17.1 g/dL    Hematocrit 62.9 (L) 37.6 - 49.6 %    Platelets 177 142 - 346 x10 3/uL    RBC 3.95 (L) 4.20 - 5.90 x10 6/uL    MCV 90.9 78.0 - 96.0 fL    MCH 29.1 25.1 - 33.5 pg    MCHC 32.0 31.5 - 35.8 g/dL    RDW 13 11 - 15 %    MPV 10.7 8.9 - 12.5 fL    Nucleated RBC 0.0 0.0 - 0.0 /100 WBC    Absolute NRBC 0.00 0.00 - 0.00 x10 3/uL    Instrument Absolute Neutrophil Count 3.94 1.10 - 6.33 x10 3/uL    Neutrophils 58.9 None %    Lymphocytes Automated 24.7 None %    Monocytes 11.5 None %    Eosinophils Automated 4.3 None %    Basophils Automated 0.3 None %    Immature Granulocytes 0.3 None %    Neutrophils Absolute 3.94 1.10 - 6.33 x10 3/uL    Lymphocytes Absolute Automated 1.65  0.42 - 3.22 x10 3/uL    Monocytes Absolute Automated 0.77 0.21 - 0.85 x10 3/uL    Eosinophils Absolute Automated 0.29 0.00 - 0.44 x10 3/uL    Basophils Absolute Automated 0.02 0.00 - 0.08 x10 3/uL    Immature  Granulocytes Absolute 0.02 0.00 - 0.07 x10 3/uL   Magnesium   Result Value Ref Range    Magnesium 1.8 1.6 - 2.6 mg/dL   Phosphorus   Result Value Ref Range    Phosphorus 3.6 2.3 - 4.7 mg/dL   I-STAT ACT KAOLIN   Result Value Ref Range    i-STAT ACT Kaolin 217 (H) 90 - 149 sec   I-STAT ACT KAOLIN   Result Value Ref Range    i-STAT ACT Kaolin 271 (H) 90 - 149 sec   Glucose Whole Blood - POCT   Result Value Ref Range    Whole Blood Glucose POCT 120 (H) 70 - 100 mg/dL   Glucose Whole Blood - POCT   Result Value Ref Range    Whole Blood Glucose POCT 129 (H) 70 - 100 mg/dL   Glucose Whole Blood - POCT   Result Value Ref Range    Whole Blood Glucose POCT 108 (H) 70 - 100 mg/dL   Glucose Whole Blood - POCT   Result Value Ref Range    Whole Blood Glucose POCT 113 (H) 70 - 100 mg/dL   Glucose Whole Blood - POCT   Result Value Ref Range    Whole Blood Glucose POCT 92 70 - 100 mg/dL   Glucose Whole Blood - POCT   Result Value Ref Range    Whole Blood Glucose POCT 88 70 - 100 mg/dL   Glucose Whole Blood - POCT   Result Value Ref Range    Whole Blood Glucose POCT 96 70 - 100 mg/dL   Glucose Whole Blood - POCT   Result Value Ref Range    Whole Blood Glucose POCT 93 70 - 100 mg/dL   Glucose Whole Blood - POCT   Result Value Ref Range    Whole Blood Glucose POCT 90 70 - 100 mg/dL   Glucose Whole Blood - POCT   Result Value Ref Range    Whole Blood Glucose POCT 103 (H) 70 - 100 mg/dL   Glucose Whole Blood - POCT   Result Value Ref Range    Whole Blood Glucose POCT 109 (H) 70 - 100 mg/dL   Glucose Whole Blood - POCT   Result Value Ref Range    Whole Blood Glucose POCT 81 70 - 100 mg/dL   ECG 12 lead   Result Value Ref Range    Ventricular Rate 66 BPM    Atrial Rate 66 BPM    P-R Interval 192 ms    QRS Duration 82 ms    Q-T Interval 394 ms    QTC Calculation (Bezet) 413 ms    P Axis 67 degrees    R Axis -15 degrees    T Axis 34 degrees    I[facility] MUSE NARRATIVE AND IMPRESSION       NORMAL SINUS RHYTHM  PREMATURE VENTRICULAR  COMPLEXES  INFERIOR MYOCARDIAL INFARCTION  Confirmed by Katrinka Blazing MD, RAHSAAN [phone]) on 09/14/2022 1:11:15 PM     ECG 12 lead (QAM x 1)   Result Value Ref Range    Ventricular Rate 63 BPM    Atrial Rate 63 BPM    P-R Interval 198 ms    QRS Duration 80 ms  Q-T Interval 394 ms    QTC Calculation (Bezet) 403 ms    P Axis 64 degrees    R Axis -7 degrees    T Axis 40 degrees    IHS MUSE NARRATIVE AND IMPRESSION       NORMAL SINUS RHYTHM  LOW VOLTAGE QRS  INFERIOR MYOCARDIAL INFARCTION (CITED ON OR BEFORE 11-Sep-2022)  ABNORMAL ECG  WHEN COMPARED WITH ECG OF 11-Sep-2022 22:52, (UNCONFIRMED)  FUSION COMPLEXES ARE NO LONGER PRESENT  PREMATURE VENTRICULAR COMPLEXES ARE NO LONGER PRESENT  Confirmed by Madu, Chinedu ([id number]) on 09/12/2022 3:04:25 PM     Echo Adult TTE Complete   Result Value Ref Range    IVS Diastolic Thickness (2D) 1.07     LVID diastole (2D) 4.64     LVID systole (2D) 3.24     BP Mod LV Ejection Fraction 58.917     LA Dimension (2D) 3.3     AV Mean Gradient 3     AV Peak Velocity 1.23     AV Area (Cont Eq VTI) 2.469     AV Area (Cont Eq VTI) 2.47     Prox Ascending Aorta Diameter 4.2     MV E/A 0.83     MV E/A 0.8     RV Basal Diastolic Dimension 3.45     TAPSE 2.14     Site RV Size (AS) normal in size     RV Function Normal     MV Regurgitation Severity mild     TV Regurgitation Severity mild     PV Regurgitation Severity no significant     Atrial Septum Findings No evidence of interatrial shunt by color Doppler.     Aortic Valve Findings There is no aortic regurgitation.     Aortic Valve Findings The aortic valve is not well visualized.     Aortic Valve Findings There is no aortic stenosis.     Pulmonary Valve Findings The pulmonic valve is not well visualized.     Pulmonary Valve Findings There is no significant pulmonic regurgitation.     Mitral Valve Findings The mitral valve is structurally normal.     Mitral Valve Findings There is mild mitral regurgitation.     Tricuspid Valve Findings The tricuspid valve  is structurally normal.     Tricuspid Valve Findings There is mild tricuspid regurgitation.     Tricuspid Valve Findings       No pulmonary hypertension with estimated right ventricular systolic pressure of  24 mmHg.    MV Area (PHT) 3.464     RV Systolic Pressure 23.612     LA Volume Index (BP A-L) 20     MV E/e' (Average) 8.076      ASSESSMENT/PLAN  Thurston Hole presented for Post Discharge/ Transition Care Management:     1. Encounter for examination following treatment at hospital  2. 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 S/p PCI with 3 DES stent to mid Lcx.  The patient and daughter had multiple questions about hospital admission lab results, I patiently explained his results, emphasized the need for follow-up with cardiology.  The patient reports compliance with Brilinta/ASA/Lipitor.  Patient currently not on ACE or beta-blocker given low blood pressures documented in the hospital.  Today, blood pressure is-103/69.  I counseled patient and daughter to continue to adhere  to Brilinta/ASA/Lipitor, will follow-up with the cardiologist next week for further recommendations.  - Referral to Cardiology - EXTERNAL; Future    3. Iron deficiency anemia due to chronic blood loss   Latest Reference Range & Units 09/12/22 03:00 09/13/22 04:29 09/14/22 03:56   Hemoglobin 12.5 - 17.1 g/dL 16.1 (L) 09.6 (L) 04.5 (L)   Hematocrit 37.6 - 49.6 % 33.3 (L) 34.8 (L) 35.9 (L)   Platelet Count 142 - 346 x10 3/uL 186 175 177   RBC 4.20 - 5.90 x10 6/uL 3.76 (L) 3.88 (L) 3.95 (L)      Latest Reference Range & Units Most Recent   Iron 41 - 168 ug/dL 29 (L)  4/0/98 11:91   TIBC 261 - 462 ug/dL 478 (L)  10/22/54 21:30   Iron Saturation 15 - 50 % 13 (L)  09/13/22 16:58   - Referral to Gastroenterology; Future    4. Uses hearing aid  - ENT Referral: Angela Adam, MD ([facility] [place]); Future      A medication reconciliation was performed, all questions were answered about  medications.   His upcoming appointments with specialists were reviewed as needed, referrals given as documented above. See  follow up visits below.   Referrals were made as needed, see orders.   Relevant labs, diagnostics and treatments were reviewed as above.   Patient's self management abilities are: Good  Patient instructed to follow up in our office in 3 weeks with primary care as scheduled..    Future Appointments   Date Time Provider Department Center   10/11/2022  8:30 AM Sioco, Emogene Morgan, MD [facility] [address]     The following activities were performed on the date of service:  obtaining and/or reviewing the separately obtained history  performing a medically appropriate examination and/or evaluation   counseling and educating the patient/family/caregiver  referring and communicating with other health care professionals (when not separately reported)   documenting clinical information in the electronic or other health records    Total time spent performing activities on date of service:  40  minutes              This note was generated within the EPIC EMR using Dragon medical speech recognition software and may contain inherent errors or omissions not intended by the user. Grammatical and punctuation errors, random word insertions, deletions, pronoun errors and incomplete sentences are occasional consequences of this technology due to software limitations. Not all errors are caught or corrected.  Although every attempt is made to root out erroneus and incomplete transcription, the note may still not fully represent the intent or opinion of the author. If there are questions or concerns about the content of this note or information contained within the body of this dictation they should be addressed directly with the author for clarification.

## 2022-09-16 ENCOUNTER — Ambulatory Visit (INDEPENDENT_AMBULATORY_CARE_PROVIDER_SITE_OTHER)
Payer: No Typology Code available for payment source | Admitting: Student in an Organized Health Care Education/Training Program

## 2022-09-16 ENCOUNTER — Encounter (INDEPENDENT_AMBULATORY_CARE_PROVIDER_SITE_OTHER): Payer: Self-pay | Admitting: Student in an Organized Health Care Education/Training Program

## 2022-09-16 VITALS — BP 103/69 | HR 68 | Temp 98.9°F | Resp 16 | Ht 66.0 in | Wt 167.0 lb

## 2022-09-16 DIAGNOSIS — Z09 Encounter for follow-up examination after completed treatment for conditions other than malignant neoplasm: Secondary | ICD-10-CM

## 2022-09-16 DIAGNOSIS — Z974 Presence of external hearing-aid: Secondary | ICD-10-CM

## 2022-09-16 DIAGNOSIS — D500 Iron deficiency anemia secondary to blood loss (chronic): Secondary | ICD-10-CM

## 2022-09-16 DIAGNOSIS — I2121 ST elevation (STEMI) myocardial infarction involving left circumflex coronary artery: Secondary | ICD-10-CM

## 2022-09-16 NOTE — Progress Notes (Signed)
Have you seen any specialists/other providers since your last visit with [place]?    Yes    Health Maintenance Due   Topic Date Due    Colorectal Cancer Screening  Never done    Advance Directive on File  Never done    Annual Exam  Never done    Shingrix Vaccine 50+ (1) Never done    Tetanus Ten-Year  Never done    COVID-19 Vaccine (1) Never done    HEPATITIS C SCREENING  Never done    Pneumonia Vaccine Age 69+ (1 - PCV) Never done    INFLUENZA VACCINE  Never done

## 2022-09-16 NOTE — Patient Instructions (Signed)
Eating foods high in iron      Green leafy vegetables and nuts are a good source of iron.     Eat foods that are high in iron such as:   Red meat (limit organ meats such as liver)  Seafood (be sure it's fully cooked), and don't eat fish that are high in mercury, such as swordfish, tilefish, king mackerel, and shark  Tofu  Eggs  Green, leafy vegetables  Whole grains and iron-fortified cereals  Dried fruits and nuts  Taking iron supplements   In most cases, a vitamin can provide enough iron. But if you need more, your healthcare provider may prescribe an iron supplement. Swallow iron pills with a glass of orange or cranberry juice. The vitamin C in these fruit juices can help your body absorb iron. But don't take your iron pills with juices that have calcium added to them. They can keep your body from absorbing the iron.   Iron supplements   Iron supplements may have certain side effects. They may cause your stools to turn black, and make you feel sick to your stomach or constipated. Here are some tips that may help you limit side effects:   Start slowly. Take 1 pill a day for a few days. Then work up to your prescribed dose over time.  Take your pills with meals, and not at bedtime.  Increase the fiber in your diet. Eat more whole grains, fruits, and vegetables.  Do mild exercise each day.  If advised by your healthcare provider, take a stool softener.  [employer] last reviewed this educational content on 10/14/2018   2000-2023 [employer]. All rights reserved. This information is not intended as a substitute for professional medical care. Always follow your healthcare professional's instructions.

## 2022-09-17 ENCOUNTER — Telehealth (INDEPENDENT_AMBULATORY_CARE_PROVIDER_SITE_OTHER): Payer: No Typology Code available for payment source | Admitting: Gastroenterology

## 2022-09-17 ENCOUNTER — Telehealth (INDEPENDENT_AMBULATORY_CARE_PROVIDER_SITE_OTHER): Payer: Self-pay

## 2022-09-17 ENCOUNTER — Encounter (INDEPENDENT_AMBULATORY_CARE_PROVIDER_SITE_OTHER): Payer: Self-pay | Admitting: Gastroenterology

## 2022-09-17 ENCOUNTER — Encounter (INDEPENDENT_AMBULATORY_CARE_PROVIDER_SITE_OTHER): Payer: No Typology Code available for payment source | Admitting: Gastroenterology

## 2022-09-17 MED ORDER — PANTOPRAZOLE SODIUM 40 MG PO TBEC
40.0000 mg | DELAYED_RELEASE_TABLET | Freq: Every morning | ORAL | 0 refills | Status: DC
Start: 2022-09-17 — End: 2023-02-24

## 2022-09-17 NOTE — Progress Notes (Deleted)
[address]  [place], [place] [address]  Appointments: Phone [phone], Fax [phone]  GASTROENTEROLOGY & HEPATOLOGY OUTPATIENT VISIT       Reason for Visit:   {Xu-Reason for Consult:49937}    History of Present Illness:  [name] [name] is a 67 y.o. male who is referred by Thurman Coyer, MD for consultation of the above reason(s)    Telehealth: Verbal consent has been obtained from the patient to conduct a video/telephone visit: yes             Heartburn/reflux?***  Dysphagia?***  Chest pain?***  Regurgitation?***  Nausea/vomiting?***  Push/strain for bm?***  Feel like incompletely empty?***  Hard or lumpy stools?***    blood in stools?***    Prior egd***  Prior colon***  No*** fam hx of crc  No*** fam hx of colon polyps                Past Medical History  Past Medical History:   Diagnosis Date    Seasonal allergic rhinitis        Past Surgical History  Past Surgical History:   Procedure Laterality Date    LEFT HEART CATH POSS PCI Left 09/11/2022    Procedure: Left Heart Cath Poss PCI;  Surgeon: Nelda Bucks, MD;  Location: [facility];  Service: Cardiovascular;  Laterality: Left;    NOSE SURGERY         Allergies  No Known Allergies    Prior to Admission medications    Medication Sig Start Date End Date Taking? Authorizing Provider   albuterol (PROVENTIL) (2.5 MG/3ML) 0.083% nebulizer solution Take 3 mLs (2.5 mg) by nebulization every 4 (four) hours as needed for Wheezing or Shortness of Breath (cough) 08/11/22 09/10/22  Olin Hauser, MD   aspirin EC 81 MG EC tablet Take 1 tablet (81 mg) by mouth daily 09/15/22 11/14/22  Yousuf,  Munib A, MD   atorvastatin (LIPITOR) 80 MG tablet Take 1 tablet (80 mg) by mouth nightly 09/14/22 11/13/22  Yousuf, Munib A, MD   benzonatate (TESSALON) 100 MG capsule Take 1 capsule (100 mg) by mouth 3 (three) times daily as needed for Cough 09/14/22   Yousuf, Munib A, MD   ferrous sulfate 324 (65 FE) MG Tablet Delayed Response Take 1 tablet (324 mg) by mouth every morning with breakfast 09/14/22 11/13/22  Yousuf, Munib A, MD   fluticasone (FLONASE) 50 MCG/ACT nasal spray 2 sprays by Nasal route daily for 7 days 09/14/22 09/21/22  Zenovia Jarred A, MD   Nebulizer Misc Dispense one nebulizer machine to include mask and tubing 09/14/22   Yousuf, Munib A, MD   ticagrelor (BRILINTA) 90 MG Tab Take 1 tablet (90 mg) by mouth every 12 (twelve) hours 09/14/22 11/13/22  Yousuf, Munib A, MD   vitamin D, ergocalciferol, (DRISDOL) 50000 UNIT Cap Take 1 capsule (50,000 Units) by mouth once a week for the next 8 weeks. 10/05/22 11/30/22  Gayleen Orem, MD       Family History  No family history on file.    Social History  Social History     Tobacco Use    Smoking status: Never    Smokeless tobacco: Never   Vaping Use    Vaping Use: Never used   Substance Use Topics    Alcohol use: Not Currently    Drug use: Never       Review of Systems:  Review of Systems   Skin: Denies jaundice        Vital Signs:     Current Outpatient Medications   Medication Sig  Dispense Refill    albuterol (PROVENTIL) (2.5 MG/3ML) 0.083% nebulizer solution Take 3 mLs (2.5 mg) by nebulization every 4 (four) hours as needed for Wheezing or Shortness of Breath (cough) 75 mL 0    aspirin EC 81 MG EC tablet Take 1 tablet (81 mg) by mouth daily 30 tablet 1    atorvastatin (LIPITOR) 80 MG tablet Take 1 tablet (80 mg) by mouth nightly 30 tablet 1    benzonatate (TESSALON) 100 MG capsule Take 1 capsule (100 mg) by mouth 3 (three) times daily as needed for Cough 15 capsule 0    ferrous sulfate 324 (65 FE) MG Tablet Delayed Response Take 1 tablet (324 mg) by mouth every morning with  breakfast 60 tablet 0    fluticasone (FLONASE) 50 MCG/ACT nasal spray 2 sprays by Nasal route daily for 7 days 11.1 mL 0    Nebulizer Misc Dispense one nebulizer machine to include mask and tubing 1 each 0    ticagrelor (BRILINTA) 90 MG Tab Take 1 tablet (90 mg) by mouth every 12 (twelve) hours 60 tablet 1    [START ON 10/05/2022] vitamin D, ergocalciferol, (DRISDOL) 50000 UNIT Cap Take 1 capsule (50,000 Units) by mouth once a week for the next 8 weeks. 8 capsule 0     No current facility-administered medications for this visit.        Objective:   There were no vitals taken for this visit.  Physical Exam     Physical Exam:   No detailed PE possible due to Video Visit except gross assessment by audio and/or video.  General: Well developed, well nourished. no acute distress.  HEENT: normal hearing.  Neck: No asymmetry  Lung: non labored respirations  Neurologic: Awake, alert, and oriented.  Psychiatric: Cooperative    Laboratory and Data Review:    Recent Labs     09/14/22  0356 09/13/22  0429 09/12/22  0300   WBC 6.69 5.76 7.80   Hgb 11.5* 11.4* 11.3*   Hematocrit 35.9* 34.8* 33.3*   Platelets 177 175 186       Recent Labs     09/11/22  1905   PT 13.7*   PT INR 1.2*       Recent Labs     09/14/22  0356 09/13/22  1658 09/13/22  0429 09/12/22  0426 09/11/22  1843 08/16/22  1030 01/11/22  1438   Sodium 138  --  138 130* 136  --  139   Potassium 3.9  --  4.0 3.7 3.6   < > 4.6   Chloride 110  --  110 104 101  --  105   CO2 22  --  22 20 25   --  30*   BUN 11.0  --  7.0* 14.0 14.0  --  12.0   Creatinine 0.9  --  0.8 0.7 0.8  --  0.8   Calcium 7.7*  --  8.0* 7.6* 9.2  --  9.7   Albumin  --   --   --  3.0* 3.9  --  4.1   Protein, Total  --   --   --  5.1* 6.9  --  7.4   Bilirubin, Total  --   --   --  0.6 0.4  --  0.3   Alkaline Phosphatase  --   --   --  47 58  --  87   ALT  --   --   --  37 18  --  17   AST (SGOT)  --   --   --  211* 22  --  21   Glucose 87  --  96 138* 144*  --  93   Ferritin  --  234.70  --   --   --   --    --     < > = values in this interval not displayed.          Rads:   No results found.      Assessment/Recommendations:   No diagnosis found.         #Rectal bleeding  Ddx includes but not limited to: hemorrhoids, anal fissures, mass/lesion/polyps, proctitis, rectal ulcers, AVMs, IBD or CRC or diverticular bleeding    The colonoscopy procedure was explained to the patient  Risks/benefits/alternatives were discussed including but not limiting:bleeding, infection, perforation, injury to spleen/liver, missed lesions, and anesthesia complications (such as heart/lung issues). Prep instructions and encourage to drink entire prescribed regimen also discussed. The patient voiced understanding and agrees to proceed.        Recommendations:  -colonoscopy   -high fiber diet: whole food plant based diet, 5 servings of fruits and vegetables daily; pears apples prunes kiwifruit mangoes papaya beans lentil legumes  -adequate water intake; mineral water has magnesium and that can also facilitate bowel movements but it is not necessary to drink mineral water   -remain active/aerobic exercise  -elevate feet with a stool or using a toilet that is lower to the ground  -Miralax: 1 dose (1 cap or 1 packet) daily for 2 weeks; if no drastic improvement, increase to 1 dose twice a day  -CONSISTENCY IS KEY!        Summary:       There are no diagnoses linked to this encounter.   There are no discontinued medications.       Thank you for allowing me to participate in the care of [facility].   All recent labs and radiologic studies were reviewed at the time of the visit.   If you have any questions or concerns, please do not hesitate to contact us.    Follow-up date to be determined as needed after the diagnostic test/procedure results are back.    Continue follow up with primary physician but return earlier if symptoms worsen or fail to improve.    Vassie Loll, DO  [facility] Gastroenterology and Hepatology   [address]  [address]  [place], [place] [address]  Phone [phone]      No LOS data to display       Total time spent on this encounter *** minutes.    This includes the time spent on the day of service reviewing pathology and/or imaging reports and/or laboratory studies as well as time spent discussing the care with other physicians and/or also face to face time with the patient. Greater than 50% of this time was spent counseling and/or coordinating care.        This note was generated by the Epic EMR system/ Dragon speech recognition and may contain inherent errors or omissions not intended by the user. Grammatical errors, random word insertions, deletions and pronoun errors are occasional consequences of this technology due to software limitations. Not all errors are caught or corrected. If there are questions or concerns about the content of this note or information contained within the body of this dictation they should be addressed directly with the author for clarification.

## 2022-09-23 ENCOUNTER — Other Ambulatory Visit (INDEPENDENT_AMBULATORY_CARE_PROVIDER_SITE_OTHER): Payer: Self-pay | Admitting: Cardiovascular Disease

## 2022-09-23 ENCOUNTER — Encounter (INDEPENDENT_AMBULATORY_CARE_PROVIDER_SITE_OTHER): Payer: Self-pay | Admitting: Cardiovascular Disease

## 2022-09-23 ENCOUNTER — Ambulatory Visit (INDEPENDENT_AMBULATORY_CARE_PROVIDER_SITE_OTHER): Payer: No Typology Code available for payment source | Admitting: Cardiovascular Disease

## 2022-09-23 DIAGNOSIS — I213 ST elevation (STEMI) myocardial infarction of unspecified site: Secondary | ICD-10-CM

## 2022-09-23 MED ORDER — METOPROLOL SUCCINATE ER 25 MG PO TB24
25.0000 mg | ORAL_TABLET | Freq: Every day | ORAL | 0 refills | Status: DC
Start: 2022-09-23 — End: 2022-09-23

## 2022-09-23 MED ORDER — ATORVASTATIN CALCIUM 80 MG PO TABS
80.0000 mg | ORAL_TABLET | Freq: Every evening | ORAL | 1 refills | Status: DC
Start: 2022-09-23 — End: 2022-11-09

## 2022-09-23 MED ORDER — TICAGRELOR 90 MG PO TABS
90.0000 mg | ORAL_TABLET | Freq: Two times a day (BID) | ORAL | 1 refills | Status: DC
Start: 2022-09-23 — End: 2022-10-07

## 2022-09-23 NOTE — Telephone Encounter (Signed)
Pharmacist called to clarify Metoprolol Succinate dose. What was sent today was -     Disp Refills Start End    metoprolol succinate XL (TOPROL-XL) 25 MG 24 hr tablet (Discontinued) 90 tablet 0 09/23/2022 09/23/2022    Sig - Route: Take 1 tablet (25 mg) by mouth daily Take half tablet daily - Oral      Per note - CAD/STEMI. S/p PCI x3 to LCx on 12/20. Cont DAPT, high-intensity statin. Unable to tolerate BB at this time due to bradycardia.

## 2022-09-23 NOTE — Progress Notes (Signed)
[facility]    Chief Complaint   Patient presents with    Hospital Follow-up     09/11/22-09/14/22    STEMI     ED f/u       Assessment and Plan     1 M with DM, HL, pw inferolateral STEMI, here for postprocedure check.     PLAN:   CAD/STEMI. S/p PCI x3 to LCx on 12/20. Cont DAPT, high-intensity statin. Unable to tolerate BB at this time due to bradycardia.  Cath with mild CAD in LAD and RCA.   Outpatient cardiac rehab  NSVT. No events overnight.   Hypotension. Monitor for symptoms.     Guidelines for HR, BP reviewed.  Cardiac rehab referral.  F/u with Dr. Tamala Julian in 3 mo       History of Present Illness     Here for postdischarge f/u for STEMI.  No wrist complications.    Tolerating all medications.  Feels well.    No CP, no sob.    We discussed cardiac rehab.     His daughter asked to provide translation.    Past Medical History     Past Medical History:   Diagnosis Date    Seasonal allergic rhinitis        Past Surgical History     Past Surgical History:   Procedure Laterality Date    LEFT HEART CATH POSS PCI Left 09/11/2022    Procedure: Left Heart Cath Poss PCI;  Surgeon: Verlan Friends, MD;  Location: [facility];  Service: Cardiovascular;  Laterality: Left;    NOSE SURGERY         Family History     No family history on file.    Social History     Social History     Socioeconomic History    Marital status: Married   Occupational History    Occupation: retired     Comment: Merchant navy officer   Tobacco Use    Smoking status: Never    Smokeless tobacco: Never   Brewing technologist Use: Never used   Substance and Sexual Activity    Alcohol use: Not Currently    Drug use: Never   Social History Narrative    Lives with wife, daughter in law and son.     And 2 sons 2 daughter      Social Determinants of Health     Food Insecurity: No Food Insecurity (09/12/2022)    Hunger Vital Sign     Worried About Running Out of Food in the Last Year: Never true     [place] in the Last Year: Never true    Transportation Needs: No Transportation Needs (09/12/2022)    PRAPARE - Armed forces logistics/support/administrative officer (Medical): No     Lack of Transportation (Non-Medical): No   Intimate Partner Violence: Not At Risk (09/12/2022)    Humiliation, Afraid, Rape, and Kick questionnaire     Fear of Current or Ex-Partner: No     Emotionally Abused: No     Physically Abused: No     Sexually Abused: No   Housing Stability: Low Risk  (09/12/2022)    Housing Stability Vital Sign     Unable to Pay for Housing in the Last Year: No     Number of [place] in the Last Year: 1     Unstable Housing in the Last Year: No  Allergies     No Known Allergies    Medications     Current Outpatient Medications   Medication Sig Dispense Refill    albuterol (PROVENTIL) (2.5 MG/3ML) 0.083% nebulizer solution Take 3 mLs (2.5 mg) by nebulization every 4 (four) hours as needed for Wheezing or Shortness of Breath (cough) 75 mL 0    aspirin EC 81 MG EC tablet Take 1 tablet (81 mg) by mouth daily 30 tablet 1    atorvastatin (LIPITOR) 80 MG tablet Take 1 tablet (80 mg) by mouth nightly 30 tablet 1    benzonatate (TESSALON) 100 MG capsule Take 1 capsule (100 mg) by mouth 3 (three) times daily as needed for Cough 15 capsule 0    ferrous sulfate 324 (65 FE) MG Tablet Delayed Response Take 1 tablet (324 mg) by mouth every morning with breakfast 60 tablet 0    fluticasone (FLONASE) 50 MCG/ACT nasal spray 2 sprays by Nasal route daily for 7 days 11.1 mL 0    Nebulizer Misc Dispense one nebulizer machine to include mask and tubing 1 each 0    pantoprazole (PROTONIX) 40 MG tablet Take 1 tablet (40 mg) by mouth every morning before breakfast 90 tablet 0    ticagrelor (BRILINTA) 90 MG Tab Take 1 tablet (90 mg) by mouth every 12 (twelve) hours 60 tablet 1    [START ON 10/05/2022] vitamin D, ergocalciferol, (DRISDOL) 50000 UNIT Cap Take 1 capsule (50,000 Units) by mouth once a week for the next 8 weeks. 8 capsule 0     No current facility-administered  medications for this visit.         Review of Systems     Constitutional: Negative for fevers and chills  Skin: No rash or lesions  Respiratory: Negative for cough, wheezing, or hemoptysis  Cardiovascular: as per HPI  Gastrointestinal: Negative for abdominal pain, nausea, vomiting and diarrhea  Musculoskeletal:  No arthritic symptoms  Genitourinary: Negative for dysuria  Otherwise 10 point review of systems is negative.      Physical Exam   BP 113/72 (BP Site: Left arm, Patient Position: Sitting, Cuff Size: Medium)   Pulse 66   Ht 1.702 m (5\' 7" )   Wt 77.3 kg (170 lb 6.4 oz)   SpO2 98%   BMI 26.69 kg/m     There is no height or weight on file to calculate BMI.    General:  Patient appears their stated age, well-nourished.  Alert and in no apparent distress.  Eyes: No conjunctivitis, no purulent discharge, no lid lag  ENT:  Hearing grossly intact, Nares patent bilaterally, Lips moist, color appropriate for race.  Respiratory: Clear to auscultation and percussion throughout. Respiratory effort unlabored, chest expansion symmetric.    Cardio: Regular rate and rhythm. Normal S1/S2; No carotid bruits or thrills, no JVD.  Extremities: warm, pulses 2+, no peripheral edema  GI: Soft, nondistended, nontender.  No guarding or rebound.  Skin: Color appropriate for race, Skin warm, dry, and intact  Psychiatric: Good insight and judgment, oriented to person, place, and time    Labs     Lipid Panel   Cholesterol   Date/Time Value Ref Range Status   09/12/2022 03:42 AM 137 0 - 199 mg/dL Final   08/16/2022 10:30 AM 189 100 - 199 mg/dL Final     Triglycerides   Date/Time Value Ref Range Status   09/12/2022 03:42 AM 55 34 - 149 mg/dL Final     HDL   Date/Time Value Ref Range  Status   09/12/2022 03:42 AM 38 (L) 40 - 9,999 mg/dL Final     Comment:     An HDL cholesterol <40 mg/dL is low and constitutes a  coronary heart disease risk factor, and HDL-C>59 mg/dL is  a negative risk factor for CHD.  Ref: American Heart Association;  Circulation 2004     08/16/2022 10:30 AM 44 >39 mg/dL Final     CBC   Lab Results   Component Value Date    WBC 6.69 09/14/2022    HGB 11.5 (L) 09/14/2022    HCT 35.9 (L) 09/14/2022    MCV 90.9 09/14/2022    PLT 177 09/14/2022      BMP  Lab Results   Component Value Date    CO2 22 09/14/2022    BUN 11.0 09/14/2022     INR   Lab Results   Component Value Date    INR 1.2 (H) 09/11/2022        EKG   I have reviewed and interpreted the EKG.          I spent 30 mins with the patient, >50% of the time was spent on education and counseling.      Renard Hamper, MD, MD, MS    [facility] Electrophysiology

## 2022-09-23 NOTE — Addendum Note (Signed)
Addended by: Otilio Miu on: 09/23/2022 08:47 AM     Modules accepted: Orders, Level of Service

## 2022-09-28 NOTE — Telephone Encounter (Signed)
Patient daughter called into get RX Refills sent to local pharmacy not [facility] where they were originally sent.     RX Needed:  1. metoprolol succinate XL (TOPROL-XL) 25 MG 24 hr tablet   2. ticagrelor (BRILINTA) 90 MG Tab   3. atorvastatin (LIPITOR) 80 MG tablet     [place], [address] Phone: [phone]   Fax: [phone]

## 2022-09-29 ENCOUNTER — Emergency Department
Admission: EM | Admit: 2022-09-29 | Discharge: 2022-09-29 | Disposition: A | Discharge status: Other Acute Inpatient Hospital | Payer: No Typology Code available for payment source | Attending: Emergency Medicine | Admitting: Emergency Medicine

## 2022-09-29 ENCOUNTER — Inpatient Hospital Stay: Payer: No Typology Code available for payment source

## 2022-09-29 ENCOUNTER — Telehealth (INDEPENDENT_AMBULATORY_CARE_PROVIDER_SITE_OTHER): Payer: Self-pay

## 2022-09-29 ENCOUNTER — Encounter (INDEPENDENT_AMBULATORY_CARE_PROVIDER_SITE_OTHER): Payer: Self-pay | Admitting: Family Medicine

## 2022-09-29 ENCOUNTER — Emergency Department: Payer: No Typology Code available for payment source

## 2022-09-29 ENCOUNTER — Encounter
Admission: AD | Disposition: A | Discharge status: Home Health | Payer: Self-pay | Source: Home / Self Care | Attending: Internal Medicine

## 2022-09-29 ENCOUNTER — Ambulatory Visit (INDEPENDENT_AMBULATORY_CARE_PROVIDER_SITE_OTHER): Payer: Self-pay | Admitting: Family Medicine

## 2022-09-29 ENCOUNTER — Observation Stay
Admission: AD | Admit: 2022-09-29 | Discharge: 2022-09-30 | Disposition: A | Discharge status: Home Health | Payer: No Typology Code available for payment source | Attending: Student in an Organized Health Care Education/Training Program | Admitting: Student in an Organized Health Care Education/Training Program

## 2022-09-29 DIAGNOSIS — I252 Old myocardial infarction: Secondary | ICD-10-CM | POA: Insufficient documentation

## 2022-09-29 DIAGNOSIS — I429 Cardiomyopathy, unspecified: Secondary | ICD-10-CM | POA: Insufficient documentation

## 2022-09-29 DIAGNOSIS — I2510 Atherosclerotic heart disease of native coronary artery without angina pectoris: Secondary | ICD-10-CM | POA: Insufficient documentation

## 2022-09-29 DIAGNOSIS — Z7982 Long term (current) use of aspirin: Secondary | ICD-10-CM | POA: Insufficient documentation

## 2022-09-29 DIAGNOSIS — R2689 Other abnormalities of gait and mobility: Secondary | ICD-10-CM | POA: Insufficient documentation

## 2022-09-29 DIAGNOSIS — E119 Type 2 diabetes mellitus without complications: Secondary | ICD-10-CM | POA: Insufficient documentation

## 2022-09-29 DIAGNOSIS — I213 ST elevation (STEMI) myocardial infarction of unspecified site: Principal | ICD-10-CM | POA: Insufficient documentation

## 2022-09-29 DIAGNOSIS — Z79899 Other long term (current) drug therapy: Secondary | ICD-10-CM | POA: Insufficient documentation

## 2022-09-29 DIAGNOSIS — R9431 Abnormal electrocardiogram [ECG] [EKG]: Secondary | ICD-10-CM

## 2022-09-29 DIAGNOSIS — Z6827 Body mass index (BMI) 27.0-27.9, adult: Secondary | ICD-10-CM | POA: Insufficient documentation

## 2022-09-29 DIAGNOSIS — Z955 Presence of coronary angioplasty implant and graft: Secondary | ICD-10-CM | POA: Insufficient documentation

## 2022-09-29 DIAGNOSIS — Z7902 Long term (current) use of antithrombotics/antiplatelets: Secondary | ICD-10-CM | POA: Insufficient documentation

## 2022-09-29 DIAGNOSIS — R079 Chest pain, unspecified: Secondary | ICD-10-CM

## 2022-09-29 DIAGNOSIS — E785 Hyperlipidemia, unspecified: Secondary | ICD-10-CM | POA: Insufficient documentation

## 2022-09-29 HISTORY — PX: LEFT HEART CATH POSS PCI: CATH6

## 2022-09-29 LAB — COMPREHENSIVE METABOLIC PANEL
ALT: 22 U/L (ref 0–55)
AST (SGOT): 18 U/L (ref 5–41)
Albumin/Globulin Ratio: 1.3 (ref 0.9–2.2)
Albumin: 3.8 g/dL (ref 3.5–5.0)
Alkaline Phosphatase: 66 U/L (ref 37–117)
Anion Gap: 8 (ref 5.0–15.0)
BUN: 13 mg/dL (ref 9.0–28.0)
Bilirubin, Total: 0.6 mg/dL (ref 0.2–1.2)
CO2: 25 mEq/L (ref 17–29)
Calcium: 9 mg/dL (ref 8.5–10.5)
Chloride: 105 mEq/L (ref 99–111)
Creatinine: 0.8 mg/dL (ref 0.5–1.5)
Globulin: 3 g/dL (ref 2.0–3.6)
Glucose: 97 mg/dL (ref 70–100)
Potassium: 4.3 mEq/L (ref 3.5–5.3)
Protein, Total: 6.8 g/dL (ref 6.0–8.3)
Sodium: 138 mEq/L (ref 135–145)
eGFR: >60 mL/min/{1.73_m2} (ref >=60)

## 2022-09-29 LAB — HIGH SENSITIVITY TROPONIN-I
hs Troponin-I: 72.3 ng/L — CR
hs Troponin-I: 60.5 ng/L — AB

## 2022-09-29 LAB — URINALYSIS WITH REFLEX TO MICROSCOPIC EXAM - REFLEX TO CULTURE
Bilirubin, UA: NEGATIVE
Blood, UA: NEGATIVE
Glucose, UA: NEGATIVE
Ketones UA: NEGATIVE
Leukocyte Esterase, UA: NEGATIVE
Nitrite, UA: NEGATIVE
Protein, UR: NEGATIVE
Specific Gravity UA: 1.01 (ref 1.001–1.035)
Urine pH: 6.5 (ref 5.0–8.0)
Urobilinogen, UA: 0.2 mg/dL (ref 0.2–2.0)

## 2022-09-29 LAB — CBC AND DIFFERENTIAL
Absolute NRBC: 0 10*3/uL (ref 0.00–0.00)
Basophils Absolute Automated: 0.03 10*3/uL (ref 0.00–0.08)
Basophils Automated: 0.4 %
Eosinophils Absolute Automated: 0.45 10*3/uL — ABNORMAL HIGH (ref 0.00–0.44)
Eosinophils Automated: 6.7 %
Hematocrit: 38.9 % (ref 37.6–49.6)
Hgb: 12.9 g/dL (ref 12.5–17.1)
Immature Granulocytes Absolute: 0.01 10*3/uL (ref 0.00–0.07)
Immature Granulocytes: 0.1 %
Instrument Absolute Neutrophil Count: 3.6 10*3/uL (ref 1.10–6.33)
Lymphocytes Absolute Automated: 2.07 10*3/uL (ref 0.42–3.22)
Lymphocytes Automated: 30.8 %
MCH: 29.5 pg (ref 25.1–33.5)
MCHC: 33.2 g/dL (ref 31.5–35.8)
MCV: 89 fL (ref 78.0–96.0)
MPV: 10.7 fL (ref 8.9–12.5)
Monocytes Absolute Automated: 0.55 10*3/uL (ref 0.21–0.85)
Monocytes: 8.2 %
Neutrophils Absolute: 3.6 10*3/uL (ref 1.10–6.33)
Neutrophils: 53.8 %
Nucleated RBC: 0 /100 WBC (ref 0.0–0.0)
Platelets: 257 10*3/uL (ref 142–346)
RBC: 4.37 10*6/uL (ref 4.20–5.90)
RDW: 13 % (ref 11–15)
WBC: 6.71 10*3/uL (ref 3.10–9.50)

## 2022-09-29 LAB — ECG 12-LEAD
Atrial Rate: 63 {beats}/min
IHS MUSE NARRATIVE AND IMPRESSION: NORMAL
P Axis: 67 degrees
P-R Interval: 188 ms
Q-T Interval: 384 ms
QRS Duration: 80 ms
QTC Calculation (Bezet): 392 ms
R Axis: -21 degrees
T Axis: -33 degrees
Ventricular Rate: 63 {beats}/min

## 2022-09-29 LAB — PT AND APTT
PT INR: 1.1 (ref 0.9–1.1)
PT: 13.2 s — ABNORMAL HIGH (ref 10.1–12.9)
PTT: 28 s (ref 27–39)

## 2022-09-29 SURGERY — LEFT HEART CATH POSS PCI
Anesthesia: Anesthesia Choice | Laterality: Left

## 2022-09-29 MED ORDER — LIDOCAINE HCL 1 % IJ SOLN
INTRAMUSCULAR | Status: AC
Start: 2022-09-29 — End: ?
  Filled 2022-09-29: qty 10

## 2022-09-29 MED ORDER — HEPARIN (PORCINE) IN NACL 2-0.9 UNIT/ML-% IJ SOLN (WRAP)
INTRAVENOUS | Status: AC
Start: 2022-09-29 — End: ?
  Filled 2022-09-29: qty 1500

## 2022-09-29 MED ORDER — MAGNESIUM SULFATE IN D5W 1-5 GM/100ML-% IV SOLN
1.0000 g | INTRAVENOUS | Status: DC | PRN
Start: 2022-09-29 — End: 2022-09-30

## 2022-09-29 MED ORDER — NITROGLYCERIN 0.4 MG SL SUBL
SUBLINGUAL_TABLET | SUBLINGUAL | Status: DC
Start: 2022-09-29 — End: 2022-09-29
  Filled 2022-09-29: qty 25

## 2022-09-29 MED ORDER — HEPARIN SODIUM (PORCINE) 5000 UNIT/ML IJ SOLN
5000.0000 [IU] | Freq: Three times a day (TID) | INTRAMUSCULAR | Status: DC
Start: 2022-09-29 — End: 2022-09-30
  Administered 2022-09-29 – 2022-09-30 (×3): 5000 [IU] via SUBCUTANEOUS
  Filled 2022-09-29 (×3): qty 1

## 2022-09-29 MED ORDER — LACTATED RINGERS IV SOLN
INTRAVENOUS | Status: DC
Start: 2022-09-29 — End: 2022-09-30

## 2022-09-29 MED ORDER — TICAGRELOR 90 MG PO TABS
90.0000 mg | ORAL_TABLET | Freq: Two times a day (BID) | ORAL | Status: DC
Start: 2022-09-29 — End: 2022-09-30
  Administered 2022-09-29 – 2022-09-30 (×2): 90 mg via ORAL
  Filled 2022-09-29 (×3): qty 1

## 2022-09-29 MED ORDER — VH VERAPAMIL HCL 2.5 MG/ML IV SOLN (IR NARRATOR)
INTRAVENOUS | Status: AC | PRN
Start: 2022-09-29 — End: 2022-09-29
  Administered 2022-09-29: 2.5 mg via INTRA_ARTERIAL

## 2022-09-29 MED ORDER — DIPHENHYDRAMINE HCL 50 MG/ML IJ SOLN
INTRAMUSCULAR | Status: AC
Start: 2022-09-29 — End: ?
  Filled 2022-09-29: qty 1

## 2022-09-29 MED ORDER — ONDANSETRON HCL 4 MG/2ML IJ SOLN
INTRAMUSCULAR | Status: DC
Start: 2022-09-29 — End: 2022-09-29
  Filled 2022-09-29: qty 2

## 2022-09-29 MED ORDER — HEPARIN SODIUM (PORCINE) 1000 UNIT/ML IJ SOLN
INTRAMUSCULAR | Status: AC
Start: 2022-09-29 — End: ?
  Filled 2022-09-29: qty 10

## 2022-09-29 MED ORDER — PANTOPRAZOLE SODIUM 40 MG IV SOLR
40.0000 mg | Freq: Every day | INTRAVENOUS | Status: DC
Start: 2022-09-29 — End: 2022-09-30
  Administered 2022-09-29: 40 mg via INTRAVENOUS
  Filled 2022-09-29: qty 40

## 2022-09-29 MED ORDER — MAGNESIUM OXIDE 400 MG TABS (WRAP)
400.0000 mg | ORAL_TABLET | ORAL | Status: DC | PRN
Start: 2022-09-29 — End: 2022-09-30

## 2022-09-29 MED ORDER — ASPIRIN 81 MG PO CHEW
324.0000 mg | CHEWABLE_TABLET | Freq: Once | ORAL | Status: DC
Start: 2022-09-29 — End: 2022-09-29

## 2022-09-29 MED ORDER — LIDOCAINE HCL 1 % IJ SOLN
INTRAMUSCULAR | Status: AC | PRN
Start: 2022-09-29 — End: 2022-09-29
  Administered 2022-09-29: 3 mL via SUBCUTANEOUS

## 2022-09-29 MED ORDER — ASPIRIN 81 MG PO TBEC
81.0000 mg | DELAYED_RELEASE_TABLET | Freq: Every day | ORAL | Status: DC
Start: 2022-09-30 — End: 2022-09-30
  Administered 2022-09-30: 81 mg via ORAL
  Filled 2022-09-29: qty 1

## 2022-09-29 MED ORDER — IODIXANOL 320 MG/ML IV SOLN
INTRAVENOUS | Status: AC | PRN
Start: 2022-09-29 — End: 2022-09-29
  Administered 2022-09-29: 85 mL

## 2022-09-29 MED ORDER — SODIUM CHLORIDE 0.9 % IV BOLUS
1000.0000 mL | Freq: Once | INTRAVENOUS | Status: DC
Start: 2022-09-29 — End: 2022-09-29
  Administered 2022-09-29: 1000 mL via INTRAVENOUS

## 2022-09-29 MED ORDER — SODIUM PHOSPHATES 3 MMOLE/ML IV SOLN (WRAP)
35.0000 mmol | INTRAVENOUS | Status: DC | PRN
Start: 2022-09-29 — End: 2022-09-30

## 2022-09-29 MED ORDER — SODIUM PHOSPHATES 3 MMOLE/ML IV SOLN (WRAP)
15.0000 mmol | INTRAVENOUS | Status: DC | PRN
Start: 2022-09-29 — End: 2022-09-30

## 2022-09-29 MED ORDER — NITROGLYCERIN IN D5W 200-5 MCG/ML-% IV SOLN VIAL
INTRAVENOUS | Status: AC
Start: 2022-09-29 — End: ?
  Filled 2022-09-29: qty 10

## 2022-09-29 MED ORDER — DIPHENHYDRAMINE HCL 50 MG/ML IJ SOLN
INTRAMUSCULAR | Status: AC | PRN
Start: 2022-09-29 — End: 2022-09-29
  Administered 2022-09-29: 25 mg via INTRAVENOUS

## 2022-09-29 MED ORDER — FENTANYL CITRATE (PF) 50 MCG/ML IJ SOLN (WRAP)
INTRAMUSCULAR | Status: AC
Start: 2022-09-29 — End: ?
  Filled 2022-09-29: qty 2

## 2022-09-29 MED ORDER — POTASSIUM CHLORIDE 10 MEQ/100ML IV SOLN (WRAP)
10.0000 meq | INTRAVENOUS | Status: DC | PRN
Start: 2022-09-29 — End: 2022-09-30

## 2022-09-29 MED ORDER — SENNOSIDES-DOCUSATE SODIUM 8.6-50 MG PO TABS
1.0000 | ORAL_TABLET | Freq: Two times a day (BID) | ORAL | Status: DC
Start: 2022-09-29 — End: 2022-09-30
  Administered 2022-09-29: 1 via ORAL
  Filled 2022-09-29 (×2): qty 1

## 2022-09-29 MED ORDER — NITROGLYCERIN IN D5W 200-5 MCG/ML-% IV SOLN VIAL
INTRAVENOUS | Status: AC | PRN
Start: 2022-09-29 — End: 2022-09-29
  Administered 2022-09-29: 200 ug via INTRA_ARTERIAL

## 2022-09-29 MED ORDER — FENTANYL CITRATE (PF) 50 MCG/ML IJ SOLN (WRAP)
INTRAMUSCULAR | Status: AC | PRN
Start: 2022-09-29 — End: 2022-09-29
  Administered 2022-09-29: 50 ug via INTRAVENOUS

## 2022-09-29 MED ORDER — CALCIUM CARBONATE ANTACID 500 MG PO CHEW
500.0000 mg | CHEWABLE_TABLET | Freq: Four times a day (QID) | ORAL | Status: DC | PRN
Start: 2022-09-29 — End: 2022-09-30
  Administered 2022-09-29: 500 mg via ORAL
  Filled 2022-09-29: qty 1

## 2022-09-29 MED ORDER — SODIUM PHOSPHATES 3 MMOLE/ML IV SOLN (WRAP)
25.0000 mmol | INTRAVENOUS | Status: DC | PRN
Start: 2022-09-29 — End: 2022-09-30

## 2022-09-29 MED ORDER — HEPARIN SODIUM (PORCINE) 5000 UNIT/ML IJ SOLN
4000.0000 [IU] | Freq: Once | INTRAMUSCULAR | Status: AC
Start: 2022-09-29 — End: 2022-09-29

## 2022-09-29 MED ORDER — FAMOTIDINE 10 MG/ML IV SOLN (WRAP)
20.0000 mg | Freq: Two times a day (BID) | INTRAVENOUS | Status: DC
Start: 2022-09-29 — End: 2022-09-29
  Administered 2022-09-29: 20 mg via INTRAVENOUS
  Filled 2022-09-29: qty 2

## 2022-09-29 MED ORDER — ATORVASTATIN CALCIUM 80 MG PO TABS
80.0000 mg | ORAL_TABLET | Freq: Every evening | ORAL | Status: DC
Start: 2022-09-29 — End: 2022-09-30
  Administered 2022-09-29: 80 mg via ORAL
  Filled 2022-09-29: qty 1

## 2022-09-29 MED ORDER — ASPIRIN 81 MG PO TBEC
81.0000 mg | DELAYED_RELEASE_TABLET | Freq: Every day | ORAL | Status: DC
Start: 2022-09-29 — End: 2022-09-29

## 2022-09-29 MED ORDER — POTASSIUM CHLORIDE CRYS ER 20 MEQ PO TBCR
0.0000 meq | EXTENDED_RELEASE_TABLET | ORAL | Status: DC | PRN
Start: 2022-09-29 — End: 2022-09-30

## 2022-09-29 MED ORDER — ASPIRIN 81 MG PO CHEW
CHEWABLE_TABLET | ORAL | Status: AC
Start: 2022-09-29 — End: 2022-09-29
  Administered 2022-09-29: 162 mg
  Filled 2022-09-29: qty 4

## 2022-09-29 MED ORDER — VERAPAMIL HCL 2.5 MG/ML IV SOLN
INTRAVENOUS | Status: AC
Start: 2022-09-29 — End: ?
  Filled 2022-09-29: qty 2

## 2022-09-29 MED ORDER — POTASSIUM CHLORIDE 20 MEQ PO PACK
0.0000 meq | PACK | ORAL | Status: DC | PRN
Start: 2022-09-29 — End: 2022-09-30

## 2022-09-29 MED ORDER — METOPROLOL SUCCINATE ER 25 MG PO TB24
25.0000 mg | ORAL_TABLET | Freq: Every day | ORAL | Status: DC
Start: 2022-09-30 — End: 2022-09-30
  Administered 2022-09-30: 25 mg via ORAL
  Filled 2022-09-29: qty 1

## 2022-09-29 MED ORDER — ALUM & MAG HYDROXIDE-SIMETH 200-200-20 MG/5ML PO SUSP
30.0000 mL | Freq: Two times a day (BID) | ORAL | Status: DC
Start: 2022-09-29 — End: 2022-09-30
  Administered 2022-09-29 – 2022-09-30 (×2): 30 mL via ORAL
  Filled 2022-09-29 (×2): qty 30

## 2022-09-29 MED ORDER — HEPARIN SODIUM (PORCINE) 5000 UNIT/ML IJ SOLN
INTRAMUSCULAR | Status: AC
Start: 2022-09-29 — End: 2022-09-29
  Administered 2022-09-29: 4000 [IU] via INTRAVENOUS
  Filled 2022-09-29: qty 1

## 2022-09-29 MED ORDER — SODIUM CHLORIDE 0.9 % IV BOLUS
1000.0000 mL | Freq: Once | INTRAVENOUS | Status: DC
Start: 2022-09-29 — End: 2022-09-29

## 2022-09-29 MED ORDER — MIDAZOLAM HCL 1 MG/ML IJ SOLN (WRAP)
INTRAMUSCULAR | Status: AC | PRN
Start: 2022-09-29 — End: 2022-09-29
  Administered 2022-09-29: 1 mg via INTRAVENOUS

## 2022-09-29 MED ORDER — MIDAZOLAM HCL 1 MG/ML IJ SOLN (WRAP)
INTRAMUSCULAR | Status: AC
Start: 2022-09-29 — End: ?
  Filled 2022-09-29: qty 2

## 2022-09-29 SURGICAL SUPPLY — 23 items
CATHETER ANGIO SS NYL PU RADL TIG 4 CRV (Catheter) ×1
CATHETER ANGIO TRILON 145D PGTL CRV EXPO (Catheter) ×1
CATHETER ANGIO TRILON FR4 CRV EXPO 5FR (Catheter Micellaneous) ×1
CATHETER GD HDRPH TIG4 CRV CONVEY 5FR (Catheter) ×1
CATHETER GD PTFE JL3.5 CRV RWY 6FR 100CM (Catheter) ×1
CATHETER JL3.5 CURVE OD6 FR LARGE LUMEN (Catheter) ×1
CATHETER OD5 FR L100 CM FULL LENGTH WIRE (Catheter Micellaneous) ×1
CATHETER OD5 FR L100 CM FULL LENGTH WIRE BRAID ROBUST SHAFT FR4 CURVE (Catheter Miscellaneous) ×1 IMPLANT
CATHETER OD5 FR L100 CM TIG4 CURVE CONVEY GUIDING LARGE INNER LUMEN (Catheter) ×1 IMPLANT
CATHETER OD5 FR L110 CM FULL LENGTH WIRE (Catheter) ×1
CATHETER OD5 FR L110 CM FULL LENGTH WIRE BRAID ROBUST SHAFT 145 D (Catheter) ×1 IMPLANT
CATHETER OD5 FR L110 CM LARGE LUMEN (Catheter) ×1
CATHETER OD5 FR L110 CM LARGE LUMEN SIDEHOLE 2 BRAID SOFT TIP RADIAL (Catheter) ×1 IMPLANT
CATHETER OD6 FR L100 CM JL3.5 CURVE RUNWAY GUIDING LARGE LUMEN (Catheter) ×1 IMPLANT
CATHETER TIG4 CURVE OD5 FR LARGE INNER (Catheter) ×1
KIT FLD MGMT ESSNT .118IN STRL Y ADPR (Kits) ×1
KIT FLUID MANAGEMENT Y ADAPTER ID.118 IN (Kits) ×1 IMPLANT
KIT INTRO HDRPH .035IN GLIDESHEATH 6FR (Introducer) ×1
KIT INTRO SS HDRPH .021IN 35MM FLX STRG (Sheaths) ×1
KIT INTRODUCER L10 CM .021 IN L35 MM (Sheaths) ×1
KIT INTRODUCER L10 CM .021 IN L35 MM FLEX STRAIGHT SHEATH DILATOR (Sheaths) ×1 IMPLANT
KIT INTRODUCER L10 CM .035 IN SPRING J (Introducer) ×1
KIT INTRODUCER L10 CM .035 IN SPRING J TIP GUIDEWIRE BASIC TRANSITION (Introducer) ×1 IMPLANT

## 2022-09-29 NOTE — Consults (Signed)
PROGRESS NOTE    Date Time: 09/29/22 6:28 PM  Patient Name: [name] [name]     Patient was seen at the request of ED physician for emergent cardiac catheterization.  Assessment:       Chest pain.  Abnormal EKG.  Coronary artery disease and history of stent placement.  Hyperlipidemia.    Plan:   Cardiac enzymes.  Lipid panel.  Aspirin 81 mg p.o. once a day.  Ticagrelor 90 mg p.o. twice a day.  Atorvastatin 80 mg p.o. once a day.  Toprol-XL 12.5 mg p.o. twice a day.  Protonix 40 mg p.o. once a day.  CBC BMP and BNP level.  Subjective/chief complaint:   Patient is seen and examined  All medications and labs  reviewed    HPI;  This patient is [age] year old man with a history of coronary artery disease presented to the emergency department [facility] for evaluation of chest pain which started 2 days ago.  The chest pain was described 4 out of 10 worsened with exertion.  Evaluation emergency department recorded blood pressure 104/67 heart rate 62 respirations 17 and temperature 97.9 F.  His EKG showed normal sinus rhythm with 1 mm ST elevation in leads V5, V6 and inverted T in leads II, III and aVF.    Code STEMI was activated and patient was emergently transferred to the Cath Lab of [facility].    Emergent cardiac catheterization showed patent left circumflex stents.  There was no significant flow-limiting coronary stenosis.  LV systolic function was noted to be borderline to mildly reduced with ejection fraction 45 to 50%.  Past medical history;  Coronary artery disease and s/p stent placement in left circumflex.  Recent myocardial infarction.  Dyslipidemia      Medications: As listed     Current Facility-Administered Medications   Medication Dose Route Frequency    alum & mag hydroxide-simethicone  30 mL Oral Q12H    [START ON 09/30/2022] aspirin EC  81 mg Oral Daily    atorvastatin  80 mg Oral QHS    famotidine  20 mg Intravenous Q12H [place]    heparin (porcine)  5,000 Units Subcutaneous Q8H [place]     [START ON 09/30/2022] metoprolol succinate XL  25 mg Oral Daily    senna-docusate  1 tablet Oral Q12H [place]    ticagrelor  90 mg Oral Q12H   Social history;  Patient lives at home.  Does not smoke cigarettes.    Review of Systems:   General ROS: no weight loss, no weight gain, no fever, no chills  Hematological and Lymphatic ROS: negative  Endocrine ROS: no fatigue, no polyuria, no polydipsia, no general weakness  Respiratory ROS: no shortness of breath, no cough, no wheezes and no hemoptysis  Cardiovascular ROS: +chest pain, no palpitations, no PND and no orthopnea, +DOE  Gastrointestinal ROS:no nausea, no vomiting, no diarrhea, no constipation, no blood in stool and no abdominal pain  Musculoskeletal ROS: no muscle pain, no muscle weakness, no joint pain, no swelling and no redness  Neurological ROS: no headache, no dizziness, no diplopia, no focal weakness, no seizure  Dermatological ROS: no rash, no itching and no ecchymoses, no pressure ulcer      Physical Exam:   BP 108/71   Pulse 60   Resp 19   SpO2 97%       Intake/Output Summary (Last 24 hours) at 09/29/2022 1828  Last data filed at 09/29/2022 1652  Gross per 24 hour   Intake --  Output 20 ml   Net -20 ml       General appearance - alert, well appearing, and in no distress  Mental status - alert, oriented to person, place, and time  HEENT:normocephalic,  no icterus, no pallor, no cyanosis  Neck: supple, no JVD, no thyromegaly, no carotid bruits  Chest - clear to auscultation, no wheezes, rales or rhonchi, symmetric air entry  Heart - normal rate, regular rhythm, normal S1, S2, no S3 ,+murmurs, rubs, clicks or gallops  Abdomen - soft, nontender, nondistended, no masses or organomegaly  Neurological - alert, oriented, normal speech, no focal findings  noted  Musculoskeletal - no joint tenderness, deformity or swelling seen.  Extremities - peripheral pulses normal, no pedal edema, no clubbing or cyanosis  Skin - normal color , no rashes.    Labs:     CBC w/Diff    Recent Labs   Lab 09/29/22  1502   WBC 6.71   Hgb 12.9   Hematocrit 38.9   Platelets 257          Basic Metabolic Profile   Recent Labs   Lab 09/29/22  1502   Sodium 138   Potassium 4.3   Chloride 105   CO2 25   BUN 13.0   Creatinine 0.8   Calcium 9.0   ALT 22   AST (SGOT) 18   Glucose 97          Cardiac Enzymes   Recent Labs   Lab 09/29/22  1502   hs Troponin-I 72.3*       No results found for: "BNP"       Thyroid Studies          Invalid input(s): "FREET4"        Lipid Profile            Radiology Results (24 Hour)       ** No results found for the last 24 hours. Vonzell Schlatter, MD, MD  09/29/2022 6:28 PM

## 2022-09-29 NOTE — ED Notes (Signed)
This RN secure chat DO Gregery Na for orders on medications.     Verbal not received, RN awaiting order for medications before administering to patient.

## 2022-09-29 NOTE — Consults (Incomplete)
PROGRESS NOTE    Date Time: 09/29/22 3:32 PM  Patient Name: [name] [name],[name] [name]      Assessment:     Patient Active Problem List   Diagnosis    Uses hearing aid    Bilateral chronic knee pain    ST elevation myocardial infarction (STEMI), unspecified artery    STEMI (ST elevation myocardial infarction)     ***    Plan:   ***  Subjective/chief complaint:   Patient is seen and examined  All medications and labs  reviewed    Medications:     Current Facility-Administered Medications   Medication Dose Route Frequency    aspirin        heparin (porcine)        heparin (porcine)  4,000 Units Intravenous Once    nitroglycerin        ondansetron           Review of Systems:   General ROS: no weight loss, no weight gain, no fever, no chills  Hematological and Lymphatic ROS: negative  Endocrine ROS: no fatigue, no polyuria, no polydipsia, no general weakness  Respiratory ROS: no shortness of breath, no cough, no wheezes and no hemoptysis  Cardiovascular ROS: no chest pain, no palpitations, no PND and no orthopnea, no DOE  Gastrointestinal ROS:no nausea, no vomiting, no diarrhea, no constipation, no blood in stool and no abdominal pain  Musculoskeletal ROS: no muscle pain, no muscle weakness, no joint pain, no swelling and no redness  Neurological ROS: no headache, no dizziness, no diplopia, no focal weakness, no seizure  Dermatological ROS: no rash, no itching and no ecchymoses, no pressure ulcer      Physical Exam:   BP 104/71   Pulse 72   Temp 97.9 F (36.6 C) (Oral)   Resp 22   Wt 82.4 kg (181 lb 10.5 oz)   SpO2 96%   BMI 28.45 kg/Elisse Pennick     No intake or output data in the 24 hours ending 09/29/22 1532    General appearance - alert, well appearing, and in no distress  Mental status - alert, oriented to person, place, and time  HEENT:normocephalic, EOMI, PERRL, no icterus, no pallor, no cyanosis  Neck: supple, no JVD, no thyromegaly, no carotid bruits  Chest - clear to auscultation, no wheezes, rales or rhonchi, symmetric air  entry  Heart - normal rate, regular rhythm, normal S1, S2, no S3 ,no murmurs, rubs, clicks or gallops  Abdomen - soft, nontender, nondistended, no masses or organomegaly  Neurological - alert, oriented, normal speech, no focal findings  noted  Musculoskeletal - no joint tenderness, deformity or swelling seen.  Extremities - peripheral pulses normal, no pedal edema, no clubbing or cyanosis  Skin - normal color , no rashes.    Labs:     CBC w/Diff   Recent Labs   Lab 09/29/22  1502   WBC 6.71   Hgb 12.9   Hematocrit 38.9   Platelets 257          Basic Metabolic Profile   Recent Labs   Lab 09/29/22  1502   Sodium 138   Potassium 4.3   Chloride 105   CO2 25   BUN 13.0   Creatinine 0.8   Calcium 9.0   ALT 22   AST (SGOT) 18   Glucose 97          Cardiac Enzymes         No results found for: "BNP"  Thyroid Studies          Invalid input(s): "FREET4"        Lipid Profile            Radiology Results (24 Hour)       Procedure Component Value Units Date/Time    Chest AP Portable [[id number]] Resulted: 09/29/22 1514    Order Status: Sent Updated: 09/29/22 [place] Fruma Africa, MD, MD  09/29/2022 3:32 PM

## 2022-09-29 NOTE — ED Notes (Signed)
STEMI alert called.     MSET cart brought to room.     Pads attached to patient.     Attempting 2nd IV placement

## 2022-09-29 NOTE — Telephone Encounter (Signed)
Spoke to DIL, pt was present and answering as well     Chief Complaint: CP    Onset  2 days ago     Hx   Heart attack 2 weeks ago     Symptoms   Pain is constant, getting worse, currently at 4/5   Fatigue  Denies SOB    Recommended calling 911 due to hx and current sx, amenable. Wanted a cardio referral for pt, let DIL know to call 911 first and to call us back after ER to sch a follow-up to get referral handled, amenable.     Fwded to PCP.       Anjeli Casad, RN

## 2022-09-29 NOTE — Plan of Care (Addendum)
Pt AOX4, VSS, denies pain. Pulses are palpable. Labs were draw. Family updated by MD at bedside.   Troponin was sent.   LR fluids were started.   Passing Gas.  UO 661ml plus 1 urine occurrence.   TR band started to removed around 655pm.   Report were given to night nurse.   Problem: Moderate/High Fall Risk Score >5  Goal: Patient will remain free of falls  Outcome: Progressing     Problem: Hemodynamic Status: Cardiac  Goal: Stable vital signs and fluid balance  Outcome: Progressing  Flowsheets (Taken 09/29/2022 1830)  Stable vital signs and fluid balance:   Assess signs and symptoms associated with cardiac rhythm changes   Monitor lab values     Problem: Inadequate Tissue Perfusion  Goal: Adequate tissue perfusion will be maintained  Outcome: Progressing  Flowsheets (Taken 09/29/2022 1830)  Adequate tissue perfusion will be maintained:   Monitor/assess lab values and report abnormal values   Monitor/assess neurovascular status (pulses, capillary refill, pain, paresthesia, paralysis, presence of edema)   Monitor/assess for signs of VTE (edema of calf/thigh redness, pain)   Monitor for signs and symptoms of a pulmonary embolism (dyspnea, tachypnea, tachycardia, confusion)     Problem: Ineffective Gas Exchange  Goal: Effective breathing pattern  Outcome: Progressing

## 2022-09-29 NOTE — ED Provider Notes (Signed)
EMERGENCY DEPARTMENT NOTE     Patient initially seen and examined at   ED PHYSICIAN ASSIGNED       Date/Time Event User Comments    09/29/22 [place], [place] [name] [name], [name], DO assigned as Attending           ED MIDLEVEL (APP) ASSIGNED       None            HISTORY OF PRESENT ILLNESS       Chief Complaint: Chest Pain       67 y.o. male with past medical history as below presents with chest pain x 2 days. He states it is about a 4-6 out of 10. He states it is sometimes worse with exertion. He also admits to some diaphoresis. He denies pleuritic or exertional pain. He denies shortness of breath or leg swelling. He had a STEMI 18 days ago and got 3 DES to Lcx. Has been compliant with asa and brilinta.     Independent Historian (other than patient): No  Additional History Provided by Independent Historian:  MEDICAL HISTORY     Past Medical History:  Past Medical History:   Diagnosis Date    Seasonal allergic rhinitis     Vitamin D deficiency        Past Surgical History:  Past Surgical History:   Procedure Laterality Date    LEFT HEART CATH POSS PCI Left 09/11/2022    Procedure: Left Heart Cath Poss PCI;  Surgeon: Verlan Friends, MD;  Location: [facility];  Service: Cardiovascular;  Laterality: Left;    NOSE SURGERY         Social History:  Social History     Socioeconomic History    Marital status: Married   Occupational History    Occupation: retired     Comment: Merchant navy officer   Tobacco Use    Smoking status: Never    Smokeless tobacco: Never   Vaping Use    Vaping Use: Never used   Substance and Sexual Activity    Alcohol use: Not Currently    Drug use: Never   Social History Narrative    Lives with wife, daughter in law and son.     And 2 sons 2 daughter      Social Determinants of Health     Food Insecurity: No Food Insecurity (09/29/2022)    Hunger Vital Sign     Worried About Running Out of Food in the Last Year: Never true     [place] in the Last Year: Never  true   Transportation Needs: No Transportation Needs (09/12/2022)    PRAPARE - Armed forces logistics/support/administrative officer (Medical): No     Lack of Transportation (Non-Medical): No   Intimate Partner Violence: Not At Risk (09/29/2022)    Humiliation, Afraid, Rape, and Kick questionnaire     Fear of Current or Ex-Partner: No     Emotionally Abused: No     Physically Abused: No     Sexually Abused: No   Housing Stability: Low Risk  (09/12/2022)    Housing Stability Vital Sign     Unable to Pay for Housing in the Last Year: No     Number of [place] in the Last Year: 1     Unstable Housing in the Last Year: No       Family History:  History reviewed. No pertinent family history.  Outpatient Medication:  Previous Medications    ALBUTEROL (PROVENTIL) (2.5 MG/3ML) 0.083% NEBULIZER SOLUTION    Take 3 mLs (2.5 mg) by nebulization every 4 (four) hours as needed for Wheezing or Shortness of Breath (cough)    ASPIRIN EC 81 MG EC TABLET    Take 1 tablet (81 mg) by mouth daily    ATORVASTATIN (LIPITOR) 80 MG TABLET    Take 1 tablet (80 mg) by mouth nightly    BENZONATATE (TESSALON) 100 MG CAPSULE    Take 1 capsule (100 mg) by mouth 3 (three) times daily as needed for Cough    FERROUS SULFATE 324 (65 FE) MG TABLET DELAYED RESPONSE    Take 1 tablet (324 mg) by mouth every morning with breakfast    FLUTICASONE (FLONASE) 50 MCG/ACT NASAL SPRAY    2 sprays by Nasal route daily for 7 days    METOPROLOL SUCCINATE XL (TOPROL-XL) 25 MG 24 HR TABLET    TAKE 1 TABLET (25 MG) BY MOUTH DAILY TAKE HALF TABLET DAILY    NEBULIZER MISC    Dispense one nebulizer machine to include mask and tubing    PANTOPRAZOLE (PROTONIX) 40 MG TABLET    Take 1 tablet (40 mg) by mouth every morning before breakfast    TICAGRELOR (BRILINTA) 90 MG TAB    Take 1 tablet (90 mg) by mouth every 12 (twelve) hours    VITAMIN D, ERGOCALCIFEROL, (DRISDOL) 50000 UNIT CAP    Take 1 capsule (50,000 Units) by mouth once a week for the next 8 weeks.         REVIEW OF  SYSTEMS   Review of Systems See History of Present Illness  PHYSICAL EXAM     ED Triage Vitals   Enc Vitals Group      BP 09/29/22 1452 104/67      Heart Rate 09/29/22 1452 62      Resp Rate 09/29/22 1452 17      Temp 09/29/22 1452 97.9 F (36.6 C)      Temp Source 09/29/22 1452 Oral      SpO2 09/29/22 1452 97 %      Weight 09/29/22 1518 82.4 kg      Height --       Head Circumference --       Peak Flow --       Pain Score 09/29/22 1452 5      Pain Loc --       Pain Edu? --       Excl. in [place]? --      Physical Exam   Vital Signs and Nursing notes reviewed.    Constitutional: well appearing, well hydrated, non toxic, comfortable  HEENT: NC/AT, moist mucus membranes, oropharynx clear, nose normal, Pupils equal and reactive, EOMI  Neck: Supple, non tender, no bony or midline tenderness, full ROM, no meningeal signs  Pulmonary: CTAB, no wheezes rales or rhonchi. Normal work of breathing. No respiratory distress.  Cardiovascular: Regular Rate and Rhythm. No murmurs, rubs, or gallops.   Abdomen: Soft, non tender, non distended. No rebound, guarding, or rigidity  Extremities: no deformities, no edema, normal ROM, compartments soft, neurovascularly intact throughout  Skin: normal, no rash, lesions, wounds, edema or color change  Neuro: CN 2-12 intact grossly, no facial asymmetry, no focal weakness, AAOx3  Psych: normal mood and affect       MEDICAL DECISION MAKING     PRIMARY PROBLEM LIST      Acute illness/injury with risk  to life or bodily function (based on differential diagnosis or evaluation) DIAGNOSIS:cp  Chronic Illness Impacting Care of the above problem: Coronary Artery Disease Increases complexity of evaluation, Increases the risk of severe disease, and Increase the risk of disease progression  Differential Diagnosis: Chest Pain: Pneumothorax, Unstable Angina, Pericarditis, Aortic Dissection, Esophageal Perforation, Pleurisy, Musculoskeletal, pulmonary embolism, acute coronary syndrome, myocardial infarction,  myocarditis, pericardial effusion, pericardial tamponade    DISCUSSION        67 year old male with recent STEMI 18 days ago who received 3 drug-eluting stents to left circumflex presents with chest pain x 2 days.  Patient states that chest pain is sometimes worse with exertion but not pleuritic or positional.  EKG concerning for ischemic changes given slight elevation in lead I, aVL, V5, V6.  Case discussed with Dr.ngo who recommends STEMI activation.  Spoke to Dr. Tomie China interventional cardiology at [facility] who recommends patient be transferred stat and receive heparin bolus.  Dr. Bobby Rumpf is excepting ED doc.  If patient is being hospitalized is severe sepsis or septic shock suspected?: N/A      Was management discussed with a consultant?: Yes (explain) cardiology and interventional cardiology    External Records Reviewed?: Inpatient Records    Additional Notes              ED Course as of 09/29/22 1538   Wed Sep 29, 2022   1517 Spoke to dr ngo who recommends activating a STEMI. Stemi activated in ED. Will speak to interventionalist [LP]   1533 Spoke to dr zahir, interventional cards, who states patient should be trasnferred STAT 911 to [facility] for cath; wants heparin bolus not infusion. Spoke to DR Bobby Rumpf, er physician who is accepting physician [LP]      ED Course User Index  [LP] Yue Flanigan, [name], DO         Vital Signs: Reviewed the patient's vital signs.   Nursing Notes: Reviewed and utilized available nursing notes.  Medical Records Reviewed: Reviewed available past medical records.  Counseling: The emergency provider has spoken with the patient and discussed today's findings, in addition to providing specific details for the plan of care.  Questions are answered and there is agreement with the plan.      MIPS DOCUMENTATION              CARDIAC STUDIES    The following cardiac studies were independently interpreted by me the Emergency Medicine Provider.  For full cardiac study results please see  chart.              EKG 1 interpreted by me (ED provider)  Comparison: None available  Time Interpreted:1501  Rate: 60-100  Rhythm: Normal Sinus Rhythm  ST segments: ST elevations  STEMI?: YES  EKG interpretation: STEMI                    EMERGENCY IMAGING STUDIES    The following imagine studies were independently interpreted by me (emergency medicine provider):                       RADIOLOGY IMAGING STUDIES      Chest AP Portable             EMERGENCY DEPT. MEDICATIONS      ED Medication Orders (From admission, onward)      Start Ordered     Status Ordering Provider    09/29/22 1533 09/29/22 1532  heparin (porcine) injection 4,000 Units  Once        Route: Intravenous  Ordered Dose: 4,000 Units       Ordered Lotus Gover C    09/29/22 1521 09/29/22 1521  aspirin 81 MG chewable tablet        Note to Pharmacy: Enis Gash: cabinet override    Ordered     09/29/22 1521 09/29/22 1521  heparin (porcine) 5000 UNIT/ML injection        Note to Pharmacy: Enis Gash: cabinet override    Ordered     09/29/22 1521 09/29/22 1521  ondansetron (ZOFRAN) 4 MG/2ML injection        Note to Pharmacy: Enis Gash: cabinet override    Ordered     09/29/22 1521 09/29/22 1521  nitroglycerin (NITROSTAT) 0.4 MG SL tablet        Note to Pharmacy: Enis Gash: cabinet override    Ordered             LABORATORY RESULTS    Ordered and independently interpreted AVAILABLE laboratory tests.   Results       Procedure Component Value Units Date/Time    PT/APTT [id number]  (Abnormal) Collected: 09/29/22 1502     Updated: 09/29/22 1537     PT 13.2 sec      PT INR 1.1     PTT 28 sec     Comprehensive metabolic panel [id number] Collected: 09/29/22 1502    Specimen: Blood Updated: 09/29/22 1532     Glucose 97 mg/dL      BUN 13.0 mg/dL      Creatinine 0.8 mg/dL      Sodium 138 mEq/L      Potassium 4.3 mEq/L      Chloride 105 mEq/L      CO2 25 mEq/L      Calcium 9.0 mg/dL      Protein, Total 6.8 g/dL      Albumin 3.8 g/dL      AST (SGOT)  18 U/L      ALT 22 U/L      Alkaline Phosphatase 66 U/L      Bilirubin, Total 0.6 mg/dL      Globulin 3.0 g/dL      Albumin/Globulin Ratio 1.3     Anion Gap 8.0     eGFR >60.0 mL/min/1.73 m2     CBC and differential [id number]  (Abnormal) Collected: 09/29/22 1502    Specimen: Blood Updated: 09/29/22 1514     WBC 6.71 x10 3/uL      Hgb 12.9 g/dL      Hematocrit 38.9 %      Platelets 257 x10 3/uL      RBC 4.37 x10 6/uL      MCV 89.0 fL      MCH 29.5 pg      MCHC 33.2 g/dL      RDW 13 %      MPV 10.7 fL      Instrument Absolute Neutrophil Count 3.60 x10 3/uL      Neutrophils 53.8 %      Lymphocytes Automated 30.8 %      Monocytes 8.2 %      Eosinophils Automated 6.7 %      Basophils Automated 0.4 %      Immature Granulocytes 0.1 %      Nucleated RBC 0.0 /100 WBC      Neutrophils Absolute 3.60 x10 3/uL      Lymphocytes Absolute Automated 2.07 x10 3/uL      Monocytes  Absolute Automated 0.55 x10 3/uL      Eosinophils Absolute Automated 0.45 x10 3/uL      Basophils Absolute Automated 0.03 x10 3/uL      Immature Granulocytes Absolute 0.01 x10 3/uL      Absolute NRBC 0.00 x10 3/uL     High Sensitivity Troponin-I [[id number]] Collected: 09/29/22 1502    Specimen: Blood Updated: 09/29/22 [place]    Performed by: Adriana Mccallum, DO  Authorized by: Adriana Mccallum, DO    Critical care provider statement:     Critical care time (minutes):  40    Critical care time was exclusive of:  Separately billable procedures and treating other patients    Critical care was necessary to treat or prevent imminent or life-threatening deterioration of the following conditions:  Cardiac failure    Critical care was time spent personally by me on the following activities:  Blood draw for specimens, development of treatment plan with patient or surrogate, discussions with consultants, evaluation of patient's response to treatment, examination of patient, interpretation of cardiac output  measurements, obtaining history from patient or surrogate, ordering and performing treatments and interventions, ordering and review of laboratory studies, ordering and review of radiographic studies, pulse oximetry, re-evaluation of patient's condition and review of old charts    I assumed direction of critical care for this patient from another provider in my specialty: no      Care discussed with: accepting provider at another facility        DIAGNOSIS      Diagnosis:  Final diagnoses:   ST elevation myocardial infarction (STEMI), unspecified artery       Disposition:  ED Disposition       ED Disposition   Transfer to [facility] ED    Condition   --    Date/Time   Wed Sep 29, 2022  3:32 PM    Comment   --               Prescriptions:  Patient's Medications   New Prescriptions    No medications on file   Previous Medications    ALBUTEROL (PROVENTIL) (2.5 MG/3ML) 0.083% NEBULIZER SOLUTION    Take 3 mLs (2.5 mg) by nebulization every 4 (four) hours as needed for Wheezing or Shortness of Breath (cough)    ASPIRIN EC 81 MG EC TABLET    Take 1 tablet (81 mg) by mouth daily    ATORVASTATIN (LIPITOR) 80 MG TABLET    Take 1 tablet (80 mg) by mouth nightly    BENZONATATE (TESSALON) 100 MG CAPSULE    Take 1 capsule (100 mg) by mouth 3 (three) times daily as needed for Cough    FERROUS SULFATE 324 (65 FE) MG TABLET DELAYED RESPONSE    Take 1 tablet (324 mg) by mouth every morning with breakfast    FLUTICASONE (FLONASE) 50 MCG/ACT NASAL SPRAY    2 sprays by Nasal route daily for 7 days    METOPROLOL SUCCINATE XL (TOPROL-XL) 25 MG 24 HR TABLET    TAKE 1 TABLET (25 MG) BY MOUTH DAILY TAKE HALF TABLET DAILY    NEBULIZER MISC    Dispense one nebulizer machine to include mask and tubing    PANTOPRAZOLE (PROTONIX) 40 MG TABLET    Take 1 tablet (40 mg) by mouth every morning before breakfast    TICAGRELOR (BRILINTA) 90 MG  TAB    Take 1 tablet (90 mg) by mouth every 12 (twelve) hours    VITAMIN D, ERGOCALCIFEROL, (DRISDOL) 50000 UNIT CAP     Take 1 capsule (50,000 Units) by mouth once a week for the next 8 weeks.   Modified Medications    No medications on file   Discontinued Medications    No medications on file           This note was generated by the Epic EMR system/ Dragon speech recognition and may contain inherent errors or omissions not intended by the user. Grammatical errors, random word insertions, deletions and pronoun errors  are occasional consequences of this technology due to software limitations. Not all errors are caught or corrected. If there are questions or concerns about the content of this note or information contained within the body of this dictation they should be addressed directly with the author for clarification.           Macrae Wiegman, [name], DO  09/29/22 1545

## 2022-09-29 NOTE — Progress Notes (Signed)
Daughter in law called and states pt is feeling tired and having chest pains - requesting call back from nurse as soon as possible.         [name]  [phone]

## 2022-09-29 NOTE — EDIE (Signed)
[facility]?NOTIFICATION?09/29/2022 14:44?Salyers, Alexzander?MRN: [id number]    [facility]'s patient encounter information:   [id number]:[id number]  Account [id number]  Billing Account [id number]      Criteria Met      5 ED Visits in 12 Months    Security and Safety  No Security Events were found.  ED Care Guidelines  There are currently no ED Care Guidelines for this patient. Please check your facility's medical records system.        Prescription Monitoring Program  000 ??- Narcotic Use Score   000 ??- Sedative Use Score   000 ??- Stimulant Use Score   000??- Overdose Risk Score  - All Scores range from 000-999 with 75% of the population scoring < 200 and only 1% scoring above 650  - The last digit of the narcotic, sedative, and stimulant score indicates the number of active prescriptions of that type  - Higher Use scores correlate with increased prescribers, pharmacies, mg equiv, and overlapping prescriptions   - Higher Overdose Risk Scores correlate with increased risk of unintentional overdose death   Concerning or unexpectedly high scores should prompt a review of the PMP record; this does not constitute checking PMP for prescribing purposes.    E.D. Visit Count (12 mo.)  Facility Visits   [facility] 4   Total 4   Note: Visits indicate total known visits.     Recent Emergency Department Visit Summary  Date Facility [facility] Type Diagnoses or Chief Complaint    Sep 29, 2022  [place].  [place]  [place]  Emergency      Chest Pains      Sep 11, 2022  [place].  [place]  [place]  Emergency      ST elevation (STEMI) myocardial infarction of unspecified site      shortness of breath, chest pain      Aug 11, 2022  [place].  [place]  [place]  Emergency      Acute bronchospasm      Acute bronchiolitis due to respiratory syncytial virus      Contact with and (suspected) exposure to COVID-19      URI      cough      Apr 19, 2022  [place].  [place]  [place]  Emergency      Epistaxis      fall; mouth/eye injury        Recent Inpatient Visit Summary  Date [place] Type Diagnoses or Chief Complaint    Sep 11, 2022  [place].  [place]  [place]  Cardiology      Vitamin D deficiency, unspecified      ST elevation (STEMI) myocardial infarction of unspecified site        Care Team  No Care Team was found.  [facility]  This patient has registered at the [facility] Emergency Department  For more information visit: [email or web address]     PLEASE NOTE:     1.   Any care recommendations and other clinical information are provided as guidelines or for historical purposes only, and providers should exercise their own clinical judgment when providing care.    2.   You may only use this information for purposes of treatment, payment or health care operations activities, and subject to the limitations of applicable [facility] Policies.    3.   You  should consult directly with the organization that provided a care guideline or other clinical history with any questions about additional information or accuracy or completeness of information provided.    ? [id number] [facility] - [email or web address]

## 2022-09-29 NOTE — ED Notes (Signed)
Patient informed RN they took 81 mg of ASA this morning.

## 2022-09-29 NOTE — Progress Notes (Signed)
FOUR EYES SKIN ASSESSMENT NOTE    [name] [name]  05-Aug-1956  [id number]  Braden Scale Score: 16            ICU ONLY: Was HAPI Intervention list reviewed with incoming RN/PCT? No      Bony Prominences: Check appropriate box; if wound is present enter wound assessment in LDA     Occiput:                 [x] WNL  []  Wound present  Face:                     [x] WNL  []  Wound present  Ears:                      [x] WNL  []  Wound present  Spine:                    [x] WNL  []  Wound present  Shoulders:             [x] WNL  []  Wound present  Elbows:                  [x] WNL  []  Wound present  Sacrum/coccyx:     [x] WNL  []  Wound present  Ischial Tuberosity:  [x] WNL  []  Wound present  Trochanter/Hip:      [x] WNL  []  Wound present  Knees:                   [x] WNL  []  Wound present  Ankles:                   [x] WNL  []  Wound present  Heels:                    [x] WNL  []  Wound present  Other pressure areas:  []  Wound location  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

## 2022-09-29 NOTE — ED Notes (Addendum)
Given verbal order to administer 2 81mg  ASA to patient.

## 2022-09-29 NOTE — Progress Notes (Signed)
See nurse triage note.     Selvin Yun, RN

## 2022-09-29 NOTE — Progress Notes (Signed)
CATH LAB PROCEDURE HANDOFF REPORT    INDICATIONS:   STEMI    ALLERGIES:   Patient has no known allergies.     ACC BLEEDING RISK SCORE        MEDICAL HISTORY:   Past Medical History:   Diagnosis Date    Seasonal allergic rhinitis     Vitamin D deficiency         NAME OF PROCEDURE:  Left heart cath      Lines/Drains/Airways:    Patient Lines/Drains/Airways Status       Active Lines, Drains and Airways       Name Placement date Placement time Site Days    Peripheral IV 09/29/22 20 G Diffusion Left Antecubital 09/29/22  1503  Antecubital  less than 1    Peripheral IV 09/29/22 18 G Diffusion Anterior;Right Forearm 09/29/22  1528  Forearm  less than 1    Arterial Sheath 6 Fr. Right Radial 09/29/22  1630  Radial  less than 1              Inactive Lines, Drains and Airways       None                      MEDICATIONS:   Versed: 1 mg IV  Fentanyl: 50 mcg IV  Heparin:  5000 units IV  Benadryl: 25 mg IV  Nitroglycerin: 200 mcg IA  Loading Dose of: None PO    Active Antiplatelet/Anticoagulant Medications:  Current Facility-Administered Medications (Includes Only Anticoagulants, Misc. Hematological)   Medication Dose Route Last Admin   None       VITALS:   HR:  71             Rhythm: sinus rhythm  BP: 110/64   O2 SAT: 100% Nasal Cannula 2l    PROCEDURE DETAILS:     Outcomes: diagnostic cath                                           Significant case events:  None    Final chest pain assessment::0/10    See Physicians Op note/ Report for details    HANDOFF DETAILS:    Access site: RRA    Access site checked with Receiving RN; Unit 2902.    TR band has 10 cc of air in it. No evidence of bleeding or hematoma at handoff.    Patient stable at time of hand off.    Bedside RN had no further questions at the time of handoff.

## 2022-09-29 NOTE — ED Provider Notes (Signed)
EMERGENCY DEPARTMENT HISTORY AND PHYSICAL EXAM    Date: (Not on file)  Patient Name: [name] [name]    This note was generated by the Epic EMR system/ Dragon speech recognition and may contain inherent errors or omissions not intended by the user. Grammatical errors, random word insertions, deletions and pronoun errors  are occasional consequences of this technology due to software limitations. Not all errors are caught or corrected. If there are questions or concerns about the content of this note or information contained within the body of this dictation they should be addressed directly with the author for clarification.    History of Presenting Illness     No chief complaint on file.      Chief Complaint: Chest pain    Additional History: [name] [name] is a 67 y.o. male with history of STEMI status post PCI on 09/11/2022 who presents today for chest pain.  Sent in from [facility] for concern for STEMI, transfer for cardiac catheterization, cardiology had accepted the patient to Cath Lab prior to arrival.  On arrival, patient states that chest pain has improved.  No other complaints at this time.    I reviewed patient's last ED visit, clinic visit or admission/discharge summary, as well as associated recent EKGs, lab or imaging results, if applicable.     PCP: Sioco, Yolanda Bonine, MD    Past History   Past Medical History--reviewed, as per HPI  Past Surgical History--reviewed, as per HPI  Family History--reviewed, as per HPI  Social History--reviewed, as per HPI  Allergies reviewed and documented as pertinent to the case.       Physical Exam   There were no vitals taken for this visit.    Physical Exam  Constitutional:       General: He is not in acute distress.  HENT:      Head: Normocephalic and atraumatic.      Right Ear: External ear normal.      Left Ear: External ear normal.      Nose: Nose normal. No congestion.   Eyes:      Conjunctiva/sclera: Conjunctivae normal.   Cardiovascular:       Rate and Rhythm: Normal rate and regular rhythm.      Pulses: Normal pulses.      Heart sounds: Normal heart sounds.   Pulmonary:      Effort: Pulmonary effort is normal.   Abdominal:      General: Abdomen is flat.   Musculoskeletal:         General: No deformity.      Cervical back: Normal range of motion and neck supple.   Skin:     General: Skin is warm and dry.   Neurological:      General: No focal deficit present.      Mental Status: He is alert and oriented to person, place, and time.   Psychiatric:         Mood and Affect: Mood normal.         Behavior: Behavior normal.           Diagnostic Study Results     Labs -  Reviewed, if relevant to the case.    Results       ** No results found for the last 24 hours. **            Radiologic Studies -   Reviewed, if relevant to the case.    XR Chest AP Portable  Result Date: 09/29/2022  Mild cardiomegaly. Reuben Likes, MD 09/29/2022 7:25 PM     Medical Decision Making   I am the first provider for this patient.    I reviewed the vital signs, available nursing notes, past medical history, past surgical history, family history and social history.    Vital Signs: Reviewed the patient's vital signs during ED stay.    I reviewed pt's pulse oxymetry and cardiac monitor values, as relevant to the case.    Personal Protective Equipment (PPE)  Gloves, N95 and surgical mask.    Record Review: The following, if applicable to the case, were reviewed and noted:    Old medical records.  Nursing notes.  Outside records.     No orders of the defined types were placed in this encounter.       EKG:  Interpreted by this Emergency Physician.   Time Interpreted: 1627   Rate: 63   Rhythm: Normal Sinus Rhythm    Interpretation: QRS nl, QTc nl, T wave inversions in the inferolateral leads   Comparison:  T wave inversions are new compared to prior    Clinical Decision Support:       Critical Care Time:   CRITICAL CARE: The high probability of sudden, clinically significant deterioration in  the patient's condition required the highest level of my preparedness to intervene urgently.    The services I provided to this patient were to treat and/or prevent clinically significant deterioration that could result in: Cardiac dysrhythmia, heart failure, cardiac arrest.  Services included the following: chart data review, reviewing nursing notes and/or old charts, documentation time, consultant collaboration regarding findings and treatment options, medication orders and management, direct patient care, re-evaluations, vital sign assessments and ordering, interpreting and reviewing diagnostic studies/lab tests.    Aggregate critical care time was 36 min, which includes only time during which I was engaged in work directly related to the patient's care, as described above, whether at the bedside or elsewhere in the Emergency Department.  It did not include time spent performing other reported procedures or the services of residents, students, nurses or physician assistants.      Procedures:      For Hospitalized Patients:     1. Hospitalization Decision Time:  The decision to admit this patient was made by the emergency provider at 1629 on (Not on file)      2. Core Measures:   - 12-lead EKG was performed in the ED. Aspirin: Aspirin was not given because the patient did not present with a stroke at the time of their Emergency Department evaluation.Marland Kitchen    SEP-1 Charting    A. ------------Severe Sepsis/Septic Shock Exclusion----------------------------    Pt is excluded from severe sepsis or septic shock consideration at 1629 [enter time of bed request placement] on 09/29/2022 (date) because of one or more reasons below:    No SEP-1 qualifying organ dysfunction during ED stay.     ED Course:  Medical Decision Making  67 year old male presenting today from [facility] for chest pain, excepted by cardiology for cardiac catheterization given concerning EKG prior to arrival.  On arrival he is hemodynamically  stable, states his pain is minimal.  Escorted to Cath Lab in stable condition.  Discussed case with the ICU, who accepted the patient to service for post Cath Lab care.    Risk  Decision regarding hospitalization.          Diagnosis     Clinical Impression:   1.  STEMI (ST elevation myocardial infarction)        Disposition:   ED Disposition       ED Disposition   Admit    Condition   --    Date/Time   Wed Sep 29, 2022  4:29 PM    Comment   Admitting Physician: MCKINNON, GLICK [[id number]]   Estimated Length of Stay: > or = to 2 midnights   Tentative Discharge Plan?: Home or Self Care [1]   Does patient need telemetry?: Yes   Is patient 18 yrs or greater?: Yes   Telemetry type (separate Telemetry order is also required):: Adult telemetry                 The above diagnostic process was due to medical necessity based on risk stratification of potential harm of patient's presenting complaint.     Attestations: This note is prepared by Myrtie Soman, MD. I am the first provider for this patient.    Electronically signed by Myrtie Soman, MD     Myrtie Soman, MD  09/30/22 [phone]

## 2022-09-29 NOTE — Telephone Encounter (Signed)
Patient's daughter called and stated the patient has been experiencing chest pain for the past 3 days. Strongly urged her to have the patient evaluated in the emergency room and she agreed.

## 2022-09-29 NOTE — H&P (Signed)
[facility]  History and Physical      Patient Name: [name] [name]  MRN: [id number]  Room: A2902/A2902-01  Code Status: Full Code        Chief Complaint / Primary Reason for MSICU Evaluation   NSTEMI/myocardial infarction    History of Presenting Illness   [name] [name] is a 67 y.o. male with past medical history of coronary disease status post x 3 stent who presented to the ED with complaints of having chest pain with associated lethargy.  Patient reported having 6 out of 10 chest pain with some associated exertion and some diaphoresis when sleeping at night.  Patient reported having increased pain when he laid down otherwise denies any associated shortness of breath nausea or vomiting.  Patient admitted to doing some exercises with water bottles the last few days.  Patient reported he had pain for 2 days and has been compliant with his aspirin and Brilinta which prompted a visit to the ED.  Patient had an EKG done which showed concerning ischemic changes with elevation in lead I, aVL, V5 and V6.  Cardiology was consulted and the patient subsequently underwent cardiac cath.  The patient had a cardiac cath which was unrevealing and showed patent stents.  Patient had a recent hospitalization during which patient was admitted for STEMI on 09/11/2022 and discharged on 09/14/2022.  Patient underwent cardiac cath which showed LAD disease and subsequently underwent 3 drug-eluting stents with TIMI-3 flow.  Patient was monitored in the hospital for 48 hours and subsequently downgraded and discharged after cardiology cleared.  Patient had an echo done which showed an EF of 59% with right cavity size which was normal and normal right ventricular systolic function and also left ventricular size which was normal with an ejection fraction of 60%.  Patient at the moment denies any chest pain, nausea, vomiting, fever or chills.    Subjective   Past Medical History:     Past Medical History:   Diagnosis Date     Seasonal allergic rhinitis     Vitamin D deficiency        Past Surgical History:     Past Surgical History:   Procedure Laterality Date    LEFT HEART CATH POSS PCI Left 09/11/2022    Procedure: Left Heart Cath Poss PCI;  Surgeon: Verlan Friends, MD;  Location: [facility];  Service: Cardiovascular;  Laterality: Left;    NOSE SURGERY         Family History:   No family history on file.    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     And 2 sons 2  daughter      Social Determinants of Health     Financial Resource Strain: Not on file   Food Insecurity: No Food Insecurity (09/29/2022)    Hunger Vital Sign     Worried About Running Out of Food in the Last Year: Never true     Ran Out of Food in the Last Year: Never true   Transportation Needs: Unknown (09/29/2022)    PRAPARE - Therapist, art (Medical): No     Lack of Transportation (Non-Medical): Patient unable to answer   Physical Activity: Not on file   Stress: Not on file   Social Connections: Not on file   Intimate Partner Violence: Not At Risk (09/29/2022)    Humiliation, Afraid, Rape, and Kick questionnaire     Fear of Current or Ex-Partner: No     Emotionally Abused: No     Physically Abused: No     Sexually Abused: No   Housing Stability: Low Risk  (09/12/2022)    Housing Stability Vital Sign     Unable to Pay for Housing in the Last Year: No      Number of Places Lived in the Last Year: 1     Unstable Housing in the Last Year: No       Allergies:   No Known Allergies       Objective   Physical Examination   Vitals Temp:  [97.9 F (36.6 C)]   Heart Rate:  [57-88]   Resp Rate:  [14-49]   BP: (92-121)/(56-74)   SpO2:  [57 %-100 %]   Weight:  [82.4 kg (181 lb 10.5 oz)]  // Temperature with 24 range  Vent Settings      General:     Neuro:    alert & oriented x 3     Lungs:   clear to auscultation, no wheezes, rales or rhonchi, symmetric air entry     Cardiac:   normal rate, regular rhythm, normal S1, S2, no murmurs, rubs, clicks or gallops, normal rate and regular rhythm, S1 and S2 normal     Abdomen:    soft, nontender, nondistended, no masses or organomegaly     Extremities:   peripheral pulses normal no pedal edema no clubbing or cyanosis    Skin:     Warm, dry and intact    Assessment   [name] [name] is a 67 y.o. male who presents with possible STEMI.        Patient has BMI=There is no height or weight on file to calculate BMI.          Neuro   Neuro High Impact Dx: None    Cardiovascular   Cardiac High Impact Dx: Cardiomyopathy  STEMI    Pulmonary  Pulm High Impact Dx: None    Renal  Renal High Impact Dx: None    Infections Disease  Infectious Disease High Impact Dx: None    Hematology  Heme High Impact Dx: None    Endo/Rheum  Endo High Impact: None    ICU Checklist  Sedation:   CAM-ICU: Positive or Negative for Delirium: Negative   CAM ICU:   Last Documented RASS: RASS Score: Alert and Calm   Currently ordered infusions:    lactated ringers 75 mL/hr at 09/29/22 1800    Reviewed: Yes   Mobility:   Current Mobility Level:   PMP Activity: Step 1 - Bedrest (09/29/2022  6:00 PM)    Current PT Order: Yes  Current OT Order: Yes Reviewed: Yes   Respiratory (n/a if blank):   Ventilator Time:    Last Recorded Vent Mode:    Reviewed: Yes   Gastrointenstinal  Last Bowel Movement:   Last BM Date: 09/14/22 Reviewed: Yes   CAUTI Prevention (n/a if blank):  Foley Day:         Reviewed: Yes   Blood Steam Infection Prevention (n/a if blank):    Reviewed: Yes   DVT Chemoprophylaxis (none if blank):   heparin (porcine) injection 5,000 Units  Reviewed: Yes            Plan     NEUROLOGICAL:   Delirium precautions including maintenance of day/night wake cycles, frequent reorientation as needed  Awake and alert, denies any plane, no overt deficits noted    CARDIOVASCULAR:   Etiology of chest pain is still unclear, differential include heartburn, esophageal spasms, erosive gastritis or esophagitis, vasospasms?  STEMI as above however underwent cardiac catheter which was unrevealing  Reported EF during cath was 40 to 45%  Last echo done on showed EF of 59% with grossly normal wall motion abnormalities and normal right ventricular function and an ejection fraction  Repeat echo ordered  Trend troponins  Cardiology consult  Resume home medications with aspirin, statin, Brilinta, metoprolol  TR band removal per protocol      PULMONARY:   Saturating well on room air continue to monitor  Routine x-ray ordered    RENAL:   IV fluids given contrast exposure    GASTROINTESTINAL:  At risk for constipation  Nutrition: Cardiac diet  Bowel Regimen  GI ppx: Pepcid    INFECTIOUS DISEASE:   No evidence for infection continue to monitor  UA and x-ray ordered    HEMATOLOGY/ONCOLOGY:   Subcu heparin  - VTE ppx: Heparin as above    ENDOCRONOLOGY/RHEUMATOLOGY:   Monitor    FAMILY UPDATE:   Patient and family were updated, they were appreciative and all questions were answered.     I have personally assessed the patient and based my assessment and medical decision-making on a review of the patient's history and 24-hour interval events along with medical records, physical examination, vital signs, analysis of recent laboratory results, evaluation of radiology images, and monitoring data. The findings and plan of care was discussed with the care team.      Total care time: 45 minutes    Modesto Charon, MD  09/29/2022  6:38 PM                            , Metoprolol and Brilinta

## 2022-09-29 NOTE — EDIE (Signed)
[facility]?NOTIFICATION?09/29/2022 16:11?Seiler, Brax?MRN: [id number]    Criteria Met      5 ED Visits in 12 Months    Security and Safety  No Security Events were found.  ED Care Guidelines  There are currently no ED Care Guidelines for this patient. Please check your facility's medical records system.        Prescription Monitoring Program  000 ??- Narcotic Use Score   000 ??- Sedative Use Score   000 ??- Stimulant Use Score   000??- Overdose Risk Score  - All Scores range from 000-999 with 75% of the population scoring < 200 and only 1% scoring above 650  - The last digit of the narcotic, sedative, and stimulant score indicates the number of active prescriptions of that type  - Higher Use scores correlate with increased prescribers, pharmacies, mg equiv, and overlapping prescriptions   - Higher Overdose Risk Scores correlate with increased risk of unintentional overdose death   Concerning or unexpectedly high scores should prompt a review of the PMP record; this does not constitute checking PMP for prescribing purposes.    E.D. Visit Count (12 mo.)  Facility Visits   [facility] 1   Total 5   Note: Visits indicate total known visits.     Recent Emergency Department Visit Summary  Date Facility [facility] Type Diagnoses or Chief Complaint    Sep 29, 2022  [place]  [place]  Emergency      cath lab      Sep 29, 2022  [place]  [place]  [place]  Emergency      ST elevation (STEMI) myocardial infarction of unspecified site      Chest Pain      Chest Pains      Sep 11, 2022  [place].  [place]  [place]  Emergency      ST elevation (STEMI) myocardial infarction of unspecified site      shortness of breath, chest pain      Aug 11, 2022  [place].  [place]  [place]  Emergency      Acute bronchospasm      Acute bronchiolitis due to respiratory syncytial virus      Contact with and (suspected) exposure to COVID-19      URI       cough      Apr 19, 2022  [place]  [place]  [place]  Emergency      Epistaxis      fall; mouth/eye injury        Recent Inpatient Visit Summary  Date [place] Type Diagnoses or Chief Complaint    Sep 11, 2022  [place].  [place]  [place]  Cardiology      Vitamin D deficiency, unspecified      ST elevation (STEMI) myocardial infarction of unspecified site        Care Team  No Care Team was found.  [facility]  This patient has registered at the [facility] Emergency Department  For more information visit: [email or web address]     PLEASE NOTE:     1.   Any care recommendations and other clinical information are provided as guidelines or for historical purposes only, and providers should exercise their own clinical judgment when providing care.    2.   You may only  use this information for purposes of treatment, payment or health care operations activities, and subject to the limitations of applicable [facility] Policies.    3.   You should consult directly with the organization that provided a care guideline or other clinical history with any questions about additional information or accuracy or completeness of information provided.    ? [id number] [facility] - www.[email or web address]

## 2022-09-29 NOTE — Telephone Encounter (Signed)
Agree with recommendation

## 2022-09-29 NOTE — PT Eval Note (Signed)
[facility]  Physical Therapy Attempt Note    Patient:  [name] [name] MRN#:  [id number]  Unit:  MED SURG ICU 1 Room/Bed:  A2902/A2902-01      Physical Therapy evaluation attempted on 09/29/2022 at 7:02 PM.     PT Visit Cancellation Reason: Provider/Medical Hold (comment required) - Provider/RN decision (RN reporting taking TR band off from cardiac cath procedure. Needs to limit pressure placed through arm due to risk of bleeding. PT/OT will complete assessments 1/18.)         Physical and/or occupational therapy will follow up at a later time/date as able.     RN aware.    Curt Jews, PT, DPT    Physical Medicine and [facility]   [phone]    09/29/2022  7:02 PM

## 2022-09-30 ENCOUNTER — Encounter: Payer: Self-pay | Admitting: Interventional Cardiology

## 2022-09-30 ENCOUNTER — Inpatient Hospital Stay: Payer: No Typology Code available for payment source

## 2022-09-30 DIAGNOSIS — R072 Precordial pain: Secondary | ICD-10-CM

## 2022-09-30 LAB — ECHO ADULT TTE COMPLETE
AV Area (Cont Eq VTI): 2.325
AV Area (Cont Eq VTI): 2.34
AV Mean Gradient: 5
AV Mean Gradient: 6
AV Mean Gradient: 6
AV Peak Velocity: 1.34
AV Peak Velocity: 1.37
AV Peak Velocity: 1.47
AV Peak Velocity: 1.64
AV Peak Velocity: 1.69
BP Mod LV Ejection Fraction: 49
IVS Diastolic Thickness (2D): 0.86
LA Dimension (2D): 4.9
LA Dimension (2D): 4.9
LA Volume Index (BP A-L): 34
LVID diastole (2D): 5.45
LVID systole (2D): 4.24
MV Area (PHT): 3.27
MV E/A: 1.052
MV E/A: 1.1
MV E/e' (Average): 12.623
Mitral Valve Findings: NORMAL
Prox Ascending Aorta Diameter: 4.1
Prox Ascending Aorta Diameter: 4.1
Pulmonary Valve Findings: NORMAL
RV Basal Diastolic Dimension: 3.95
RV Function: NORMAL
RV Systolic Pressure: 23.976
RV Systolic Pressure: 24
Site RV Size (AS): NORMAL
TAPSE: 1.75
Tricuspid Valve Findings: NORMAL

## 2022-09-30 LAB — COMPREHENSIVE METABOLIC PANEL
ALT: 18 U/L (ref 0–55)
AST (SGOT): 15 U/L (ref 5–41)
Albumin/Globulin Ratio: 1.4 (ref 0.9–2.2)
Albumin: 3.4 g/dL — ABNORMAL LOW (ref 3.5–5.0)
Alkaline Phosphatase: 60 U/L (ref 37–117)
Anion Gap: 6 (ref 5.0–15.0)
BUN: 12 mg/dL (ref 9.0–28.0)
Bilirubin, Total: 0.7 mg/dL (ref 0.2–1.2)
CO2: 25 mEq/L (ref 17–29)
Calcium: 8.6 mg/dL (ref 8.5–10.5)
Chloride: 109 mEq/L (ref 99–111)
Creatinine: 0.8 mg/dL (ref 0.5–1.5)
Globulin: 2.5 g/dL (ref 2.0–3.6)
Glucose: 90 mg/dL (ref 70–100)
Potassium: 4.2 mEq/L (ref 3.5–5.3)
Protein, Total: 5.9 g/dL — ABNORMAL LOW (ref 6.0–8.3)
Sodium: 140 mEq/L (ref 135–145)
eGFR: >60 mL/min/{1.73_m2} (ref >=60)

## 2022-09-30 LAB — CBC AND DIFFERENTIAL
Absolute NRBC: 0 10*3/uL (ref 0.00–0.00)
Basophils Absolute Automated: 0.02 10*3/uL (ref 0.00–0.08)
Basophils Automated: 0.3 %
Eosinophils Absolute Automated: 0.44 10*3/uL (ref 0.00–0.44)
Eosinophils Automated: 6.5 %
Hematocrit: 37.6 % (ref 37.6–49.6)
Hgb: 12.7 g/dL (ref 12.5–17.1)
Immature Granulocytes Absolute: 0.01 10*3/uL (ref 0.00–0.07)
Immature Granulocytes: 0.1 %
Instrument Absolute Neutrophil Count: 4.38 10*3/uL (ref 1.10–6.33)
Lymphocytes Absolute Automated: 1.42 10*3/uL (ref 0.42–3.22)
Lymphocytes Automated: 20.9 %
MCH: 30.1 pg (ref 25.1–33.5)
MCHC: 33.8 g/dL (ref 31.5–35.8)
MCV: 89.1 fL (ref 78.0–96.0)
MPV: 11.1 fL (ref 8.9–12.5)
Monocytes Absolute Automated: 0.54 10*3/uL (ref 0.21–0.85)
Monocytes: 7.9 %
Neutrophils Absolute: 4.38 10*3/uL (ref 1.10–6.33)
Neutrophils: 64.3 %
Nucleated RBC: 0 /100 WBC (ref 0.0–0.0)
Platelets: 232 10*3/uL (ref 142–346)
RBC: 4.22 10*6/uL (ref 4.20–5.90)
RDW: 13 % (ref 11–15)
WBC: 6.81 10*3/uL (ref 3.10–9.50)

## 2022-09-30 LAB — WHOLE BLOOD GLUCOSE POCT: Whole Blood Glucose POCT: 93 mg/dL (ref 70–100)

## 2022-09-30 MED ORDER — PSYLLIUM 3.4 G PO PACK (WRAP)
1.0000 | PACK | Freq: Every day | ORAL | Status: DC
Start: 2022-09-30 — End: 2022-09-30
  Administered 2022-09-30: 1 via ORAL
  Filled 2022-09-30 (×2): qty 1

## 2022-09-30 MED ORDER — PANTOPRAZOLE SODIUM 40 MG PO TBEC
40.0000 mg | DELAYED_RELEASE_TABLET | Freq: Every morning | ORAL | Status: DC
Start: 2022-09-30 — End: 2022-09-30
  Administered 2022-09-30: 40 mg via ORAL
  Filled 2022-09-30: qty 1

## 2022-09-30 MED ORDER — METOPROLOL SUCCINATE ER 25 MG PO TB24
25.0000 mg | ORAL_TABLET | Freq: Every day | ORAL | 0 refills | Status: DC
Start: 2022-10-01 — End: 2022-10-01

## 2022-09-30 NOTE — UM Notes (Signed)
PATIENT NAME: [name],[name]   DOB: 06-26-1956   PMH:  has a past medical history of Seasonal allergic rhinitis and Vitamin D deficiency.  PSH:  has a past surgical history that includes Nose surgery and Left Heart Cath Poss PCI (Left, 09/11/2022).     Admission Order:   Admit to Inpatient (Order [id number]) on 09/29/22     DOWNGRADED  Place (admit) for Observation Services (Order #[id number]) on 09/30/22      ADMISSION REVIEW :   History of present illness: Pt is a 67 y.o. male arrived at [facility] on (09/29/2022 at 1621).   Initially presented to OSH with chest pain x 2 days. He states it is about a 4-6 out of 10. He states it is sometimes worse with exertion. He also admits to some diaphoresis. He denies pleuritic or exertional pain. He denies shortness of breath or leg swelling. He had a STEMI 18 days ago and got 3 DES to left circumflex. Has been compliant with asa and brilinta.      EKG concerning for ischemic changes given slight elevation in lead I, aVL, V5, V6.       Transferred to [place] for cath      Arrival VS: Vitals BP: 94/56, Temp: 97.8 F (36.6 C), Temp Source: Oral, Heart Rate: 61, Resp Rate: 18, SpO2: 100 %, Height: 170.2 cm (5' 7.01"), Weight: 80.7 kg (177 lb 14.6 oz)      Abnormal Labs:  09/29/22 15:02  Albumin: 3.8  Protein Total: 6.8  hs Troponin-I: 72.3 !!  PT: 13.2 (H)    09/29/22 18:06  hs Troponin-I: 60.5 !    Diagnostics:    CXR  Mild cardiomegaly.     Admit to inpatient status, Dx  STEMI (ST elevation myocardial infarction) [I21.3], Med Surg ICU 1      Interventional Radiology A/P  Code STEMI was activated and patient was emergently transferred to the Cath Lab of [facility].    Chest pain.  Abnormal EKG.  Coronary artery disease and history of stent placement.  Hyperlipidemia.    Emergent cardiac catheterization showed patent left circumflex stents.  There was no significant flow-limiting coronary stenosis.  LV systolic function was noted to be borderline to mildly reduced with ejection  fraction 45 to 50%.    Cardiac enzymes.  Lipid panel.  Aspirin 81 mg p.o. once a day.  Ticagrelor 90 mg p.o. twice a day.  Atorvastatin 80 mg p.o. once a day.  Toprol-XL 12.5 mg p.o. twice a day.  Protonix 40 mg p.o. once a day.  CBC BMP and BNP level.      Etiology of chest pain is still unclear, differential include heartburn, esophageal spasms, erosive gastritis or esophagitis, vasospasms?  STEMI as above however underwent cardiac catheter which was unrevealing  Reported EF during cath was 40 to 45%  Last echo done on showed EF of 59% with grossly normal wall motion abnormalities and normal right ventricular function and an ejection fraction  Repeat echo ordered  Trend troponins  Cardiology consult  Resume home medications with aspirin, statin, Brilinta, metoprolol  TR band removal per protocol     PULMONARY:   Saturating well on room air continue to monitor  Routine x-ray ordered     RENAL:   IV fluids given contrast exposure     GASTROINTESTINAL:  At risk for constipation  Nutrition: Cardiac diet  Bowel Regimen  GI ppx: Pepcid     INFECTIOUS DISEASE:   No evidence for infection continue  to monitor  UA and x-ray ordered     HEMATOLOGY/ONCOLOGY:   Subcu heparin  - VTE ppx: Heparin as above     ENDOCRONOLOGY/RHEUMATOLOGY:   Monitor    _____________________________________________________  DAY 2 Continue stay review:  CSR Date: 09/30/2022 Med Surg ICU 1        O2: 97% RA  Diet: cardiac  VS:  I&O   Temp:  [97.8 F (36.6 C)-98.8 F (37.1 C)]   Heart Rate:  [57-88]   Resp Rate:  [14-49]   BP: (92-130)/(56-74)   SpO2:  [57 %-100 %]   Height:  [170.2 cm (5' 7.01")]   Weight:  [80.7 kg (177 lb 14.6 oz)-82.4 kg (181 lb 10.5 oz)]   BMI (calculated):  [27.9]    I/O last 3 completed shifts:  In: 0932 [P.O.:600; I.V.:451; IV Piggyback:10]  Out: 2070 [Urine:2050; Blood:20]       Medications:   Current Facility-Administered Medications   Medication Dose Route Frequency    aspirin EC  81 mg Oral Daily    atorvastatin  80 mg  Oral QHS    heparin (porcine)  5,000 Units Subcutaneous Q8H [place]    metoprolol succinate XL  25 mg Oral Daily    psyllium  1 packet Oral Daily    senna-docusate  1 tablet Oral Q12H [facility]    ticagrelor  90 mg Oral Q12H                A/P:  NEUROLOGICAL:   Delirium precautions including maintenance of day/night wake cycles, frequent reorientation as needed  Awake and alert, denies any plane, no overt deficits noted     CARDIOVASCULAR:   Etiology of chest pain is still unclear, differential include heartburn, esophageal spasms, erosive gastritis or esophagitis, vasospasms?  STEMI as above however underwent cardiac catheter which was unrevealing  Reported EF during cath was 40 to 45%  Last echo done on showed EF of 59% with grossly normal wall motion abnormalities and normal right ventricular function and an ejection fraction  Repeat echo ordered  Trend troponins  Cardiology consult  Resume home medications with aspirin, statin, Brilinta, metoprolol        PULMONARY:   Saturating well on room air continue to monitor  unimpressive x-ray      RENAL:   Monitor renal function     GASTROINTESTINAL:  At risk for constipation  Nutrition: Cardiac diet  Bowel Regimen  GI ppx: Pepcid     INFECTIOUS DISEASE:   No evidence for infection continue to monitor  UA and x-ray ordered     HEMATOLOGY/ONCOLOGY:   Subcu heparin  - VTE ppx: Heparin as above     ENDOCRONOLOGY/RHEUMATOLOGY:   Monitor      Per cardio:  Cardiac rehab referral was previously given, and pt and family should f/u with CR of their choice        Primary Payor: MEDICAID HMO / Plan: [place] / Product Type: MANAGED MEDICAID /   UTILIZATION REVIEW CONTACT: Name: Erling Conte, MSN, RN  Utilization Review   Phone: [phone]  Fax: [phone]  Please use fax number [phone] to provide authorization for hospital services or to request additional information.        NOTES TO REVIEWER:    This clinical review is based on/compiled from  documentation provided by the treatment team within the patient's medical record.

## 2022-09-30 NOTE — Progress Notes (Signed)
PATIENT LEFT UNIT IN WHEELCHAIR, ACCOMPANIED BY RELATIVES, APPOINTMENT GIVEN , DISCHARGE SUMMARY PRINTED AND EXPLAINED TO FAMILY . NIL COMPLAINTS VOICED .

## 2022-09-30 NOTE — Plan of Care (Addendum)
Pt AOX4, denies pain VSS. Family updated at bedside.   Report was given to nurse Legrand Rams and Rise Paganini RN  Problem: Moderate/High Fall Risk Score >5  Goal: Patient will remain free of falls  Outcome: Progressing     Problem: Hemodynamic Status: Cardiac  Goal: Stable vital signs and fluid balance  09/30/2022 0748 by Carola Frost, Marianna Fuss, RN  Flowsheets (Taken 09/30/2022 0046 by [name], RN)  Stable vital signs and fluid balance:   Assess signs and symptoms associated with cardiac rhythm changes   Monitor lab values  09/29/2022 1830 by Carola Frost, Marianna Fuss, RN  Outcome: Progressing  Flowsheets (Taken 09/29/2022 1830)  Stable vital signs and fluid balance:   Assess signs and symptoms associated with cardiac rhythm changes   Monitor lab values     Problem: Inadequate Tissue Perfusion  Goal: Adequate tissue perfusion will be maintained  Outcome: Progressing  Flowsheets (Taken 09/29/2022 1830)  Adequate tissue perfusion will be maintained:   Monitor/assess lab values and report abnormal values   Monitor/assess neurovascular status (pulses, capillary refill, pain, paresthesia, paralysis, presence of edema)   Monitor/assess for signs of VTE (edema of calf/thigh redness, pain)   Monitor for signs and symptoms of a pulmonary embolism (dyspnea, tachypnea, tachycardia, confusion)     Problem: Ineffective Gas Exchange  Goal: Effective breathing pattern  Outcome: Progressing     Problem: Safety  Goal: Patient will be free from injury during hospitalization  Flowsheets (Taken 09/30/2022 0052 by [name], RN)  Patient will be free from injury during hospitalization:   Assess patient's risk for falls and implement fall prevention plan of care per policy   Provide and maintain safe environment   Ensure appropriate safety devices are available at the bedside   Use appropriate transfer methods   Include patient/ family/ care giver in decisions related to safety   [place] rounding

## 2022-09-30 NOTE — Discharge Instr - AVS First Page (Addendum)
[facility] DISCHARGE INSTRUCTIONS     Date of Admission: 09/29/2022    Date of Discharge: 09/30/2022    Discharge Physician: Lenox Ahr, MD    Thank you for choosing the [facility]  for your healthcare needs. It was a privilege caring for you. I am genuinely concerned about your health and comfort.      Below, please find detailed instructions regarding your medical care.     You were admitted for STEMI (ST elevation myocardial infarction). It is important that you make your follow up appointments listed below.  Bring these discharge papers to your follow-up visit with your medical provider.   I cannot stress the importance of follow up enough.      Consultants: cardiology, critical care    Further Instructions:    You came to the hospital due to chest pain which was concerning for a possible heart attack. You had a cardiac catheterization that showed all prior stents were patent and there were no new blockages requiring intervention.  Cardiology recommends medical management with continuation of your atorvastatin, ticagrelor, baby aspirin and metoprolol as prescribed. They recommend outpatient cardiac rehab as well as follow-up with Dr. Tamala Julian your cardiologist in 2 months.  You have been referred to cardiac rehab for your chest pain symptoms.    You can follow up your echo results outpatient with your PCP or your cardiologist.    Follow up Appointments:   Follow-up Information       Sioco, [place], MD Follow up in 1 week(s).    Specialty: Family Medicine  Contact information:  [address]  [address] [address]  [phone]               Verlan Friends, MD Follow up in 1 month(s).    Specialties: Cardiology, Interventional Cardiology  Contact information:  [address]  [place] [address]  [phone]                                 Please take your medications as prescribed. As we discussed, please review the attached handouts for more information about important  side effects of your medication.    If you have any fever, chest pain, palpitations, shortness of breath or difficulty breathing, worsening nausea and vomiting, or lower leg pain please seek medical attention immediately.     Finally, below please find the results of imaging studies that you had in the hospital. Please review this with your primary care doctor.    You will likely be receiving a survey about the care you received in our hospital. It would mean a lot to me and all of the care team if you could fill that out and return the form. We're always looking to improve and your feedback will help [place] do that.     I wish you good health. Please do not hesitate to contact me if I can be of assistance. I aim to provide excellent care.     Respectfully Yours,    Lenox Ahr, MD        If you are unable to obtain an appointment, unable to obtain newly prescribed medications, or are unclear about any of your discharge instructions please contact your discharging physician at [phone] (M-F, 8am-3pm) or weekends and after hours via the hospital operator [phone], hospital case manager, or your primary care physician.  Imaging Studies while hospitalized:  XR Chest AP Portable    Result Date: 09/29/2022  Mild cardiomegaly. Reuben Likes, MD 09/29/2022 7:25 PM   Echo Results       None          No results found for this or any previous visit.    Please be sure to review these imaging studies with your primary medical provider.

## 2022-09-30 NOTE — Plan of Care (Addendum)
Ano x 4, full rom to all extremities .     Resp: ROM, euphenic, sp02 > 95%    Cv: SR, SBP 100- 140'S.    GU: CONTINENT X 2 IN BATHROOM    GI: CARDIAC DIET . BA X 1     SKIN: INTACT , SCARS TO LEGS   PATIENT DISCHARGED TO HOME ACCOMPANIED BY RELATIVES . NIL PAIN, APPOINTMENTS GIVEN AND DISCHARGE SUMMARY EXPLAINED .  Problem: Moderate/High Fall Risk Score >5  Goal: Patient will remain free of falls  Outcome: Progressing  Flowsheets (Taken 09/30/2022 2037)  Moderate Risk (6-13):   MOD-Remain with patient during toileting   MOD-Consider activation of bed alarm if appropriate   MOD-Floor mat at bedside (where available) if appropriate   MOD-Place bedside commode and assistive devices out of sight when not in use   MOD-Utilize diversion activities   MOD-Consider a move closer to [place]   MOD-Perform dangle, stand, walk (DSW) prior to mobilization  High (Greater than 13):   MOD-Re-orient confused patients   MOD-Perform dangle, stand, walk (DSW) prior to mobilization   HIGH-Visual cue at entrance to patient's room   MOD-Include family in multidisciplinary POC discussions   MOD-Utilize diversion activities   MOD-Use of assistive devices -Bedside Commode if appropriate   MOD-Consider a move closer to [place]   HIGH-Bed alarm on at all times while patient in bed   HIGH-Apply yellow "Fall Risk" arm band  VH Moderate Risk (6-13):   INITIATE YELLOW "FALL RISK" SIGNAGE   YELLOW NON-SKID SLIPPERS   YELLOW "FALL RISK" ARM BAND   Use chair-pad alarm device   Request PT/OT therapy consult order from Physician for patients with gait/mobility impairment  VH High Risk (Greater than 13):   Keep door open for better visibility   A CHAIR PAD ALARM WILL BE USED WHEN PATIENT IS UP SITTING IN A CHAIR   RED "HIGH FALL RISK" SIGNAGE   ALL REQUIRED MODERATE INTERVENTIONS   ALL REQUIRED LOW INTERVENTIONS   Use of floor mat   Use assistive devices   Include family/significant other in multidisciplinary discussion regarding plan of  care as appropriate     Problem: Hemodynamic Status: Cardiac  Goal: Stable vital signs and fluid balance  Outcome: Progressing  Flowsheets (Taken 09/30/2022 2037)  Stable vital signs and fluid balance:   Assess signs and symptoms associated with cardiac rhythm changes   Monitor lab values     Problem: Inadequate Tissue Perfusion  Goal: Adequate tissue perfusion will be maintained  Outcome: Progressing  Flowsheets (Taken 09/30/2022 2037)  Adequate tissue perfusion will be maintained:   Encourage/assist patient as needed to turn, cough, and perform deep breathing every 2 hours   Monitor/assess for signs of VTE (edema of calf/thigh redness, pain)   Monitor/assess neurovascular status (pulses, capillary refill, pain, paresthesia, paralysis, presence of edema)   Monitor for signs and symptoms of a pulmonary embolism (dyspnea, tachypnea, tachycardia, confusion)     Problem: Ineffective Gas Exchange  Goal: Effective breathing pattern  Outcome: Progressing  Flowsheets (Taken 09/30/2022 2037)  Effective breathing pattern: Teach/reinforce use of ordered respiratory interventions (ie. CPAP, BiPAP, Incentive Spirometer, Acapella)     Problem: Compromised Sensory Perception  Goal: Sensory Perception Interventions  Outcome: Progressing  Flowsheets (Taken 09/30/2022 2037)  Sensory Perception Interventions: Offload heels (boots or pillows), Pad bony prominences, Reposition q 2hrs, Q2 hour skin assessment under devices     Problem: Compromised Moisture  Goal: Moisture level Interventions  Outcome: Progressing  Flowsheets (  Taken 09/30/2022 2037)  Moisture level Interventions: Moisture wicking products, Moisture barrier cream     Problem: Compromised Activity/Mobility  Goal: Activity/Mobility Interventions  Outcome: Progressing  Flowsheets (Taken 09/30/2022 2037)  Activity/Mobility Interventions: Pad bony prominences, TAP Seated positioning system when OOB, Promote PMP, Reposition q 2 hrs / turn clock, Offload heels (boots or pillows)      Problem: Compromised Nutrition  Goal: Nutrition Interventions  Outcome: Progressing  Flowsheets (Taken 09/30/2022 2037)  Nutrition Interventions: Discuss nutrition at MDR, I&Os document % meal eaten, Daily weights     Problem: Safety  Goal: Patient will be free from injury during hospitalization  Outcome: Progressing  Flowsheets (Taken 09/30/2022 2037)  Patient will be free from injury during hospitalization:   Ensure appropriate safety devices are available at the bedside   Include patient/ family/ care giver in decisions related to safety   Provide and maintain safe environment   Assess patient's risk for falls and implement fall prevention plan of care per policy   Use appropriate transfer methods   Hourly rounding  Goal: Patient will be free from infection during hospitalization  Outcome: Progressing  Flowsheets (Taken 09/30/2022 2037)  Free from Infection during hospitalization:   Encourage patient and family to use good hand hygiene technique   Monitor lab/diagnostic results   Monitor all insertion sites (i.e. indwelling lines, tubes, urinary catheters, and drains)     Problem: Pain  Goal: Pain at adequate level as identified by patient  Outcome: Progressing  Flowsheets (Taken 09/30/2022 2037)  Pain at adequate level as identified by patient:   Evaluate patient's satisfaction with pain management progress   Reassess pain within 30-60 minutes of any procedure/intervention, per Pain Assessment, Intervention, Reassessment (AIR) Cycle   Assess pain on admission, during daily assessment and/or before any "as needed" intervention(s)   Offer non-pharmacological pain management interventions   Consult/collaborate with Physical Therapy, Occupational Therapy, and/or Speech Therapy   Evaluate if patient comfort function goal is met   Consult/collaborate with Pain Service   Identify patient comfort function goal

## 2022-09-30 NOTE — Treatment Plan (Cosign Needed)
FOUR EYES SKIN ASSESSMENT NOTE    [name] [name]  [birth year]  [id number]  Braden Scale Score: 21            ICU ONLY: Was HAPI Intervention list reviewed with incoming RN/PCT? Yes    Bony Prominences: Check appropriate box; if wound is present enter wound assessment in LDA     Occiput:                 [x] WNL  []  Wound present  Face:                     [x] WNL  []  Wound present  Ears:                      [x] WNL  []  Wound present  Spine:                    [x] WNL  []  Wound present  Shoulders:             [x] WNL  []  Wound present  Elbows:                  [x] WNL  []  Wound present  Sacrum/coccyx:     [x] WNL  []  Wound present  Ischial Tuberosity:  [x] WNL  []  Wound present  Trochanter/Hip:      [x] WNL  []  Wound present  Knees:                   [x] WNL  []  Wound present  Ankles:                   [x] WNL  []  Wound present  Heels:                    [x] WNL  []  Wound present  Other pressure areas:  []  Wound location       Device related: []  Device name:           Other skin related issues, ie tears, rash, etc, document in Integumentary flowsheet    If Wound/Pressure Injury present:  Wound/PI assessment documented in LDA (will be shown below): N    Admitting physician notified: No    Wound consult ordered: No      Royetta Crochet, RN  September 30, 2022  8:35 PM    Second RN/PCT Name:

## 2022-09-30 NOTE — Treatment Plan (Signed)
FOUR EYES SKIN ASSESSMENT NOTE    [name] [name]  January 08, 1956  [id number]  Braden Scale Score: 16            ICU ONLY: Was HAPI Intervention list reviewed with incoming RN/PCT? Yes    Bony Prominences: Check appropriate box; if wound is present enter wound assessment in LDA     Occiput:                 [x] WNL  []  Wound present  Face:                     [x] WNL  []  Wound present  Ears:                      [x] WNL  []  Wound present  Spine:                    [x] WNL  []  Wound present  Shoulders:             [x] WNL  []  Wound present  Elbows:                  [x] WNL  []  Wound present  Sacrum/coccyx:     [x] WNL  []  Wound present  Ischial Tuberosity:  [x] WNL  []  Wound present  Trochanter/Hip:      [x] WNL  []  Wound present  Knees:                   [x] WNL  []  Wound present  Ankles:                   [x] WNL  []  Wound present  Heels:                    [x] WNL  []  Wound present  Other pressure areas:  []  Wound location  Puncture site Rt radial      Device related: []  Device name:           Other skin related issues, ie tears, rash, etc, document in Integumentary flowsheet    If Wound/Pressure Injury present:  Wound/PI assessment documented in LDA (will be shown below): No  N/A  Admitting physician notified: No    Wound consult ordered: No      [name], RN  September 30, 2022  1:04 AM    Second RN/PCT Name:    Puncture Site 09/29/22 Radial Right (Active)   Site Assessment Clean;Dry;Intact 09/29/22 2200   Dressing Status Clean;Dry;Intact 09/29/22 2200   Drainage Amount None 09/29/22 2200   Pressure device present? No 09/29/22 2200

## 2022-09-30 NOTE — Progress Notes (Addendum)
D/C order in place todayat 3:55 PM . DCP: Home with [place] OT. CM sent [facility] referral to [facility] today.  CM called and left VM to spouse at [phone]. CM spoke with spouse to review D/C plan and transportation. Spouse to transport home    Manus Rudd, RN, BSN, [facility]  [phone]

## 2022-09-30 NOTE — Discharge Summary (Signed)
[facility]      Patient: [name] [name]  Admission Date: 09/29/2022   DOB: May 30, 1956  Discharge Date: 09/30/2022    MRN: [id number]  Discharge Attending:Kaeley Vinje Mervin Kung, MD     Referring Physician: Anson Fret, MD  PCP: Anson Fret, MD       DISCHARGE SUMMARY     Discharge Information   Admission Diagnosis:   STEMI (ST elevation myocardial infarction)    Discharge Diagnosis:   Active Hospital Problems    Diagnosis    STEMI (ST elevation myocardial infarction)      Discharge Condition: stable  Consultants: cardiology   Functional Status: ambulatory   Discharged to: home [facility]   Presentation History   From H &P: "[name] [name] is a 67 y.o. male with past medical history of coronary disease status post x 3 stent who presented to the ED with complaints of having chest pain with associated lethargy.  Patient reported having 6 out of 10 chest pain with some associated exertion and some diaphoresis when sleeping at night.  Patient reported having increased pain when he laid down otherwise denies any associated shortness of breath nausea or vomiting.  Patient admitted to doing some exercises with water bottles the last few days.  Patient reported he had pain for 2 days and has been compliant with his aspirin and Brilinta which prompted a visit to the ED.  Patient had an EKG done which showed concerning ischemic changes with elevation in lead I, aVL, V5 and V6.  Cardiology was consulted and the patient subsequently underwent cardiac cath.  The patient had a cardiac cath which was unrevealing and showed patent stents.  Patient had a recent hospitalization during which patient was admitted for STEMI on 09/11/2022 and discharged on 09/14/2022.  Patient underwent cardiac cath which showed LAD disease and subsequently underwent 3 drug-eluting stents with TIMI-3 flow.  Patient was monitored in the hospital for 48 hours and subsequently downgraded and discharged after cardiology  cleared.  Patient had an echo done which showed an EF of 59% with right cavity size which was normal and normal right ventricular systolic function and also left ventricular size which was normal with an ejection fraction of 60%.  Patient at the moment denies any chest pain, nausea, vomiting, fever or chills. "    See HPI for details.    Hospital Course (0 Days)     C52-year-old male with history of CAD with recent STEMI status post PCI x 3 to LCx 09/01/22, diabetes, hyperlipidemia presenting with chest pain for which STEMI was activated.  Patient was taken to cardiac catheterization lab on 09/29/2021.  Cardiac catheterization noted mild CAD in the LAD and RCA.  Prior stents were noted to be patent and no intervention was performed.  Cardiology was consulted and recommended continuation of medical management with DAPT, high intensity statin and metoprolol. They cleared for discharge and recommended cardiac rehab outpatient with cardiology follow up with Dr Tamala Julian in 2 months. Recommend outpatient cards/PCP follow up for ongoing evaluation. Patient was chest pain free at discharge. PT/OT recommended home with HH at [facility].     Procedures/Imaging:   XR Chest AP Portable    Result Date: 09/29/2022  Mild cardiomegaly. Reuben Likes, MD 09/29/2022 7:25 PM            Progress Note/Physical Exam at Discharge     Subjective:Patient is stable for discharge.    Vitals:  09/30/22 1100 09/30/22 1200 09/30/22 1300 09/30/22 1400   BP: 104/63 116/65  91/67   Pulse: 63 62 61 60   Resp: 19 19 20 20    Temp:  98.1 F (36.7 C)     TempSrc:  Oral     SpO2: 97% 97% 97% 96%   Weight:       Height:           General: NAD, AAOx3  HEENT: Sclera anicteric, no conjunctival injection, OP: Clear, MMM  Neck: Supple, FROM, no LAD  Cardiovascular: RRR, no m/r/g  Lungs: CTAB, no w/r/r  Abdomen: Soft, +BS, NT/ND, no masses, no g/r  Extremities: No C/C/E  Skin: No rashes or lesions noted  Neuro: CN 2-12 grossly intact; No Focal neurological deficits        Diagnostics     Labs/Studies Pending at Discharge: Yes    Last Labs   Recent Labs   Lab 09/30/22  0417 09/29/22  1502   WBC 6.81 6.71   RBC 4.22 4.37   Hgb 12.7 12.9   Hematocrit 37.6 38.9   MCV 89.1 89.0   Platelets 232 257       Recent Labs   Lab 09/30/22  0417 09/29/22  1502   Sodium 140 138   Potassium 4.2 4.3   Chloride 109 105   CO2 25 25   BUN 12.0 13.0   Creatinine 0.8 0.8   Glucose 90 97   Calcium 8.6 9.0       Microbiology Results (last 15 days)       ** No results found for the last 360 hours. **             Patient Instructions   Discharge Diet: cardiac diet  Discharge Activity: as tolerated, per cardiac rehab    Follow Up Appointment:   [place], MD Follow up in 1 week(s).    Specialty: Family Medicine  Contact information:  [address]  [address] [address]  [phone]               Verlan Friends, MD Follow up in 1 month(s).    Specialties: Cardiology, Interventional Cardiology  Contact information:  [address] [place] [address]  [phone]                              Discharge Medications:     Medication List        CHANGE how you take these medications      metoprolol succinate XL 25 MG 24 hr tablet  Commonly known as: TOPROL-XL  Take 1 tablet (25 mg) by mouth daily  Start taking on: October 01, 2022  What changed: additional instructions            CONTINUE taking these medications      albuterol (2.5 MG/3ML) 0.083% nebulizer solution  Commonly known as: PROVENTIL  Take 3 mLs (2.5 mg) by nebulization every 4 (four) hours as needed for Wheezing or Shortness of Breath (cough)     aspirin EC 81 MG EC tablet  Take 1 tablet (81 mg) by mouth daily     atorvastatin 80 MG tablet  Commonly known as: LIPITOR  Take 1 tablet (80 mg) by mouth nightly     ferrous sulfate 324 (65 FE) MG Tbec  Take 1 tablet (  324 mg) by mouth every morning with breakfast     fluticasone 50 MCG/ACT nasal spray  Commonly known as: FLONASE  2 sprays  by Nasal route daily for 7 days     pantoprazole 40 MG tablet  Commonly known as: PROTONIX  Take 1 tablet (40 mg) by mouth every morning before breakfast     ticagrelor 90 MG Tabs  Commonly known as: BRILINTA  Take 1 tablet (90 mg) by mouth every 12 (twelve) hours     vitamin D (ergocalciferol) 50000 UNIT Caps  Commonly known as: DRISDOL  Take 1 capsule (50,000 Units) by mouth once a week for the next 8 weeks.  Start taking on: October 05, 2022            STOP taking these medications      benzonatate 100 MG capsule  Commonly known as: Merchandiser, retail               Where to Get Your Medications        These medications were sent to [facility], [address] [place], [place] [address]      Phone: [phone]   metoprolol succinate XL 25 MG 24 hr tablet          Time spent examining patient, discussing with patient/family regarding hospital course, chart review, reconciling medications and discharge planning: 45 minutes.    Signed,  Lenox Ahr, MD    3:55 PM 09/30/2022

## 2022-09-30 NOTE — Progress Notes (Addendum)
[facility]  Progress Note      Patient Name: [name] [name]  MRN: [id number]  Room: A2902/A2902-01  Code Status: Full Code        Chief Complaint / Primary Reason for MSICU Evaluation   NSTEMI/myocardial infarction    History of Presenting Illness   [name] [name] is a 67 y.o. male with past medical history of coronary disease status post x 3 stent who presented to the ED with complaints of having chest pain with associated lethargy.  Patient reported having 6 out of 10 chest pain with some associated exertion and some diaphoresis when sleeping at night.  Patient reported having increased pain when he laid down otherwise denies any associated shortness of breath nausea or vomiting.  Patient admitted to doing some exercises with water bottles the last few days.  Patient reported he had pain for 2 days and has been compliant with his aspirin and Brilinta which prompted a visit to the ED.  Patient had an EKG done which showed concerning ischemic changes with elevation in lead I, aVL, V5 and V6.  Cardiology was consulted and the patient subsequently underwent cardiac cath.  The patient had a cardiac cath which was unrevealing and showed patent stents.  Patient had a recent hospitalization during which patient was admitted for STEMI on 09/11/2022 and discharged on 09/14/2022.  Patient underwent cardiac cath which showed LAD disease and subsequently underwent 3 drug-eluting stents with TIMI-3 flow.  Patient was monitored in the hospital for 48 hours and subsequently downgraded and discharged after cardiology cleared.  Patient had an echo done which showed an EF of 59% with right cavity size which was normal and normal right ventricular systolic function and also left ventricular size which was normal with an ejection fraction of 60%.  Patient at the moment denies any chest pain, nausea, vomiting, fever or chills.    1/18: Pt seen and examined at bedside. Pt still with some chest pain intermittently and some  bloating sensation, denies other complaints.     Subjective   Past Medical History:     Past Medical History:   Diagnosis Date    Seasonal allergic rhinitis     Vitamin D deficiency        Past Surgical History:     Past Surgical History:   Procedure Laterality Date    LEFT HEART CATH POSS PCI Left 09/11/2022    Procedure: Left Heart Cath Poss PCI;  Surgeon: Verlan Friends, MD;  Location: [facility];  Service: Cardiovascular;  Laterality: Left;    NOSE SURGERY         Family History:   No family history on file.    Social History:     Social History     Socioeconomic History    Marital status: Married     Spouse name: Not on file    Number of children: Not on file    Years of education: Not on file    Highest education level: Not on file   Occupational History    Occupation: retired     Comment: Merchant navy officer   Tobacco Use    Smoking status: Never    Smokeless tobacco: Never   Vaping Use    Vaping Use: Never used   Substance and Sexual Activity    Alcohol use: Not Currently    Drug use: Never    Sexual activity: Not on file   Other Topics Concern    Not on file  Social History Narrative    Lives with wife, daughter in law and son.     And 2 sons 2 daughter      Social Determinants of Health     Financial Resource Strain: Not on file   Food Insecurity: No Food Insecurity (09/29/2022)    Hunger Vital Sign     Worried About Running Out of Food in the Last Year: Never true     Ran Out of Food in the Last Year: Never true   Transportation Needs: Unknown (09/29/2022)    PRAPARE - Therapist, art (Medical): No     Lack of Transportation (Non-Medical): Patient unable to answer   Physical Activity: Not on file   Stress: Not on file   Social Connections: Not on file   Intimate Partner Violence: Not At Risk (09/29/2022)    Humiliation, Afraid, Rape, and Kick questionnaire     Fear of Current or Ex-Partner: No     Emotionally Abused: No     Physically Abused: No     Sexually Abused: No    Housing Stability: Low Risk  (09/12/2022)    Housing Stability Vital Sign     Unable to Pay for Housing in the Last Year: No     Number of Places Lived in the Last Year: 1     Unstable Housing in the Last Year: No       Allergies:   No Known Allergies       Objective   Physical Examination   Vitals Temp:  [97.8 F (36.6 C)-98.8 F (37.1 C)]   Heart Rate:  [57-88]   Resp Rate:  [14-49]   BP: (92-130)/(56-74)   SpO2:  [57 %-100 %]   Height:  [170.2 cm (5' 7.01")]   Weight:  [80.7 kg (177 lb 14.6 oz)-82.4 kg (181 lb 10.5 oz)]   BMI (calculated):  [27.9]  // Temperature with 24 range  Vent Settings      General:     Neuro:    alert & oriented x 3     Lungs:   clear to auscultation, no wheezes, rales or rhonchi, symmetric air entry     Cardiac:   normal rate, regular rhythm, normal S1, S2, no murmurs, rubs, clicks or gallops, normal rate and regular rhythm, S1 and S2 normal     Abdomen:    soft, nontender, nondistended, no masses or organomegaly     Extremities:   peripheral pulses normal no pedal edema no clubbing or cyanosis    Skin:     Warm, dry and intact    Assessment   Bach Rocchi is a 67 y.o. male who presents with possible STEMI.        Patient has BMI=Body mass index is 27.86 kg/m.          Neuro   Neuro High Impact Dx: None    Cardiovascular   Cardiac High Impact Dx: Cardiomyopathy  STEMI    Pulmonary  Pulm High Impact Dx: None    Renal  Renal High Impact Dx: None    Infections Disease  Infectious Disease High Impact Dx: None    Hematology  Heme High Impact Dx: None    Endo/Rheum  Endo High Impact: None    ICU Checklist  Sedation:   CAM-ICU: Positive or Negative for Delirium: Negative   CAM ICU:   Last Documented RASS: RASS Score: Alert and Calm   Currently ordered infusions:  Reviewed: Yes   Mobility:   Current Mobility Level:   PMP Activity: Step 7 - Walks out of Room (09/30/2022  9:51 AM)    Current PT Order: No  Current OT Order: Yes Reviewed: Yes   Respiratory (n/a if blank):   Ventilator Time:     Last Recorded Vent Mode:    Reviewed: Yes   Gastrointenstinal  Last Bowel Movement:   Last BM Date: 09/29/22 Reviewed: Yes   CAUTI Prevention (n/a if blank):  Foley Day:        Reviewed: Yes   Blood Steam Infection Prevention (n/a if blank):    Reviewed: Yes   DVT Chemoprophylaxis (none if blank):   heparin (porcine) injection 5,000 Units  Reviewed: Yes            Plan     NEUROLOGICAL:   Delirium precautions including maintenance of day/night wake cycles, frequent reorientation as needed  Awake and alert, denies any plane, no overt deficits noted    CARDIOVASCULAR:   Etiology of chest pain is still unclear, differential include heartburn, esophageal spasms, erosive gastritis or esophagitis, vasospasms?  STEMI as above however underwent cardiac catheter which was unrevealing  Reported EF during cath was 40 to 45%  Last echo done on showed EF of 59% with grossly normal wall motion abnormalities and normal right ventricular function and an ejection fraction  Repeat echo ordered  Trend troponins  Cardiology consult  Resume home medications with aspirin, statin, Brilinta, metoprolol      PULMONARY:   Saturating well on room air continue to monitor  unimpressive x-ray     RENAL:   Monitor renal function    GASTROINTESTINAL:  At risk for constipation  Nutrition: Cardiac diet  Bowel Regimen  GI ppx: Pepcid    INFECTIOUS DISEASE:   No evidence for infection continue to monitor  UA and x-ray ordered    HEMATOLOGY/ONCOLOGY:   Subcu heparin  - VTE ppx: Heparin as above    ENDOCRONOLOGY/RHEUMATOLOGY:   Monitor    FAMILY UPDATE:   Patient and family were updated, they were appreciative and all questions were answered.     I have personally assessed the patient and based my assessment and medical decision-making on a review of the patient's history and 24-hour interval events along with medical records, physical examination, vital signs, analysis of recent laboratory results, evaluation of radiology images, and monitoring data.  The findings and plan of care was discussed with the care team.      Total care time: 45 minutes    Modesto Charon, MD  09/30/2022 10:51 AM                            , Metoprolol and Brilinta

## 2022-09-30 NOTE — OT Eval Note (Signed)
Occupational Therapy Eval and Treatment [name] [name]        Post Acute Care Therapy Recommendations   Discharge Recommendations:  Home with home health OT (Cardiac Rehab)  D/C Milestones: Mod I grooming at sink    If recommended discharge disposition is not available, patient will require the following assistance: intermittent assist with IADLs    DME needs IF patient is discharging home: Shower chair (Hand held shower head)    Therapy discharge recommendations may change with patient status.  Please refer to most recent note for up-to-date recommendations.    Unit: MED SURG ICU 1  Bed: E4540/J8119-14      ___________________________________________________    Time of Evaluation and Treatment:  Time Calculation  OT Received On: 09/30/22  Start Time: 7829  Stop Time: 0940  Time Calculation (min): 42 min  Total Treatment Time (min): 42    Chart Review and Collaboration with Care Team: 10 minutes, not included in above time.    Evaluation: 15 minutes  Treatment:  27 minutes    OT Visit Number: 1    Consult received for [name] [name] for OT Evaluation and Treatment.  Patient's medical condition is appropriate for Occupational therapy intervention at this time.    Activity Orders:  OT eval and treat    Precautions and Contraindications:  Precautions  Weight Bearing Status: no restrictions  Other Precautions: R wrist immobilized s/p cardiac cath    Personal Protective Equipment (PPE)  gloves and procedure mask    Medical Diagnosis:  STEMI (ST elevation myocardial infarction) [I21.3]    History of Present Illness:  [name] [name] is a 67 y.o. male admitted on 09/29/2022 with "of coronary disease status post x 3 stent who presented to the ED with complaints of having chest pain with associated lethargy.  Patient reported having 6 out of 10 chest pain with some associated exertion and some diaphoresis when sleeping at night.  Patient reported having increased pain when he laid down otherwise denies any associated shortness of  breath nausea or vomiting.  Patient admitted to doing some exercises with water bottles the last few days.  Patient reported he had pain for 2 days and has been compliant with his aspirin and Brilinta which prompted a visit to the ED.  Patient had an EKG done which showed concerning ischemic changes with elevation in lead I, aVL, V5 and V6.  Cardiology was consulted and the patient subsequently underwent cardiac cath.  The patient had a cardiac cath which was unrevealing and showed patent stents.  Patient had a recent hospitalization during which patient was admitted for STEMI on 09/11/2022 and discharged on 09/14/2022.  Patient underwent cardiac cath which showed LAD disease and subsequently underwent 3 drug-eluting stents with TIMI-3 flow.  Patient was monitored in the [name] for 48 hours and subsequently downgraded and discharged after cardiology cleared.  Patient had an echo done which showed an EF of 59% with right cavity size which was normal and normal right ventricular systolic function and also left ventricular size which was normal with an ejection fraction of 60%.  Patient at the moment denies any chest pain, nausea, vomiting, fever or chills." (From H&P)      Patient Active Problem List   Diagnosis    Uses hearing aid    Bilateral chronic knee pain    ST elevation myocardial infarction (STEMI), unspecified artery    STEMI (ST elevation myocardial infarction)        Past Medical/Surgical History:  Past Medical  History:   Diagnosis Date    Seasonal allergic rhinitis     Vitamin D deficiency      Past Surgical History:   Procedure Laterality Date    LEFT HEART CATH POSS PCI Left 09/11/2022    Procedure: Left Heart Cath Poss PCI;  Surgeon: Verlan Friends, MD;  Location: [facility];  Service: Cardiovascular;  Laterality: Left;    NOSE SURGERY          X-Rays/Tests/Labs  Lab Results   Component Value Date/Time    HGB 12.7 09/30/2022 04:17 AM    HCT 37.6 09/30/2022 04:17 AM    K 4.2 09/30/2022 04:17 AM     NA 140 09/30/2022 04:17 AM    INR 1.1 09/29/2022 03:02 PM    TROPI 60.5 (A) 09/29/2022 06:06 PM    TROPI 72.3 (AA) 09/29/2022 03:02 PM    TROPI 57,061.7 (AA) 09/12/2022 07:42 PM    TROPI >60,000.0 (AA) 09/12/2022 03:00 AM    TROPI 108.90 (H) 09/12/2022 03:00 AM       All imaging reviewed. Please see chart for details      Social History:  Prior Level of Function  Prior level of function: Independent with ADLs, Ambulates independently  Baseline Activity Level: Community ambulation  Ambulated 100 feet or more prior to admission: Yes  Dressing - Upper Body: independent  Dressing - Lower Body: independent  Feeding: independent  Bathing: independent  Grooming: independent  Toileting: independent  Employment: Retired    Associate Professor: Spouse/significant other, Children  Home Layout: Multi-level, Bed/bath upstairs  Bathroom Shower/Tub: Tub/shower unit      Subjective:  Subjective: Agreeable.   Patient is agreeable to participation in the therapy session. Family and/or guardian are agreeable to patient's participation in the therapy session. Nursing clears patient for therapy.   Patient Goal: Get better  Pain Assessment  Pain Assessment: Numeric Scale (0-10)  Pain Score: 1-mild pain  Pain Location: Ankle  Pain Orientation: Left      Objective:      Observation of Patient/Vital Signs: Blood pressure 130/73, pulse 74, temperature 98.5 F (36.9 C), temperature source Oral, resp. rate 17, height 1.702 m (5' 7.01"), weight 80.7 kg (177 lb 14.6 oz), SpO2 97 %.      Cognitive Status and Neuro Exam:  Cognition/Neuro Status  Arousal/Alertness: Appropriate responses to stimuli  Attention Span: Appears intact  Orientation Level: Oriented X4  Memory: Appears intact  Following Commands: independent  Safety Awareness: independent  Insights: Decreased awareness of deficits  Problem Solving: supervision  Behavior: calm, cooperative  Motor Planning: intact  Coordination: intact  Hand Dominance: right  handed    Musculoskeletal Examination  Gross ROM  Right Upper Extremity ROM: within functional limits  Left Upper Extremity ROM: within functional limits  Gross Strength  Right Upper Extremity Strength: within functional limits  Left Upper Extremity Strength: within functional limits     Activities of Daily Living  Self-care and Home Management  Grooming: Modified Independent, standing at sink  LB Dressing: Modified Independent, sitting, Don/doff R sock, Don/doff L sock    Functional Mobility:  Mobility and Transfers  Scooting to EOB: Modified Independent  Supine to Sit: Modified Independent  Sit to Supine: Modified Independent  Sit to Stand: Supervision  Bed to Chair: Supervision     Balance  Balance  Static Standing Balance: good    Participation and Activity Tolerance  Participation and Endurance  Participation Effort: excellent  Endurance: Tolerates <  10 min exercise with changes in vital signs  [place] Dyspnea Scale: 0 Dyspnea    Educated the Patient and Family to role of occupational therapy, plan of care, goals of therapy and safety with mobility and ADLs, energy conservation techniques with verbalized understanding .    Patient left in bedside chair with all medical equipment in place and call bell and all personal items/needs within reach (of note, pt received without alarm in place).  RN notified of session outcome. Notified CM team and attending of d/c recommendation via secure chat.      Assessment:  [name] [name] is a 67 y.o. male admitted 09/29/2022. OT Assessment: Appears to be at baseline for ADL's;Appears to be at baseline for mobility      Treatment:  Edu on use of shower chair and pacing for energy conservation. Pt amb in hallway ~60 ft after which pt instructed to take sitting rest break before standing ADLs. Pt stood 3+ mins at sink for oral care, no SOB or LOB. Pt instructed on completing LB dressing and bathing from seated position; changed socks and washed face. Pt currently  requiring assist at this time d/t R wrist splinting s/p cardiac cath. Pt's wife present during session; questions answered within scope of practice. Encouraged to follow-up with cardiac rehab for further activity/exercise precautions.     Rehabilitation Potential: Prognosis: Good, With family    Plan:  OT Frequency Recommended: one time visit - therapy discontinued   Treatment Interventions: ADL retraining, Patient/Family training, Compensatory technique education, Equipment eval/education     PMP Activity: Step 7 - Walks out of Room  Distance Walked (ft) (Step 6,7): 60 Feet    Risks/benefits/POC discussed    Goals:  Time For Goal Achievement: 1 visit  ADL Goals  Patient will groom self: at sinkside (Set-up)  Patient will dress lower body: Stand by Assist (seated)  Mobility and Transfer Goals  Pt will transfer bed to chair: Supervision           Jenkins Rouge, OTD, OTR/L  [place]   Physical Medicine and Rehabilitation   [name]     09/30/2022 10:02 AM    [facility]  Patient: [name] [name] MRN#: [id number]   Unit: MED SURG ICU 1 Bed: A2902/A2902-01

## 2022-09-30 NOTE — Progress Notes (Signed)
[place] NOTE  Otilio Miu, MD   [facility]  Office phone:  [phone]     [facility] phone [phone]  [facility] phone [phone]    Date:  09/30/2022  11:46 AM    Patient:  [name] [name].  DOB: Feb 08, 1956.  67 y.o.  male , MRN: [id number]   Attending:  Lenox Ahr, MD   Admission date:  09/29/2022     ASSESSMENT & PLAN:   52 M with DM, HL, pw inferolateral STEMI in Sep 01, 2022 (cath by Dr. Tamala Julian) now comes in with CP and STEMI was activated.  Taken to [facility] lab by Dr. Laddie Aquas on 1/17).  At the cath, it was noted that prior recent stents were patent and no intervention performed.  Pt remains on his home medications.     PLAN:   CAD/STEMI. S/p PCI x3 to LCx on 12/20. Cont DAPT, high-intensity statin. Metoprolol has been added to his regimen.  Cath with mild CAD in LAD and RCA.   Outpatient cardiac rehab     Guidelines for HR, BP reviewed.  Cardiac rehab referral was previously given, and pt and family should f/u with CR of their choice (they prefer [facility])  F/u with Dr. Tamala Julian in 2 mo    No further IP recommendations.  Patient is stable for discharge and can f/u with Dr. Tamala Julian and arrange Cardiac Rehab as previously recommended.    HISTORY OF PRESENT ILLNESS:   No events since cath    MEDICATIONS:    INFUSION MEDS:      SCHEDULED MEDS:     Current Facility-Administered Medications   Medication Dose Route Frequency    aspirin EC  81 mg Oral Daily    atorvastatin  80 mg Oral QHS    heparin (porcine)  5,000 Units Subcutaneous Q8H [place]    metoprolol succinate XL  25 mg Oral Daily    psyllium  1 packet Oral Daily    senna-docusate  1 tablet Oral Q12H [facility]    ticagrelor  90 mg Oral Q12H      PRN MEDS:   calcium carbonate    DIAGNOSTICS:         LABS:   Labs reviewed:  Recent Labs   Lab 09/30/22  0417 09/29/22  1502   WBC 6.81 6.71   Hgb 12.7 12.9   Hematocrit 37.6 38.9   Platelets 232 257     Recent Labs   Lab 09/30/22  0417 09/29/22  1502   Sodium 140 138   Potassium  4.2 4.3   Chloride 109 105   CO2 25 25   BUN 12.0 13.0   Creatinine 0.8 0.8   eGFR >60.0 >60.0   Glucose 90 97   Calcium 8.6 9.0     Recent Labs   Lab 09/29/22  1806 09/29/22  1502   hs Troponin-I 60.5* 72.3*             FLUID BALANCE AND WEIGHT:                                           Last filed weight: Weight: 80.7 kg (177 lb 14.6 oz)  Weight on Admission: Weight: 80.7 kg (177 lb 14.6 oz)  Net IO Since Admission: -1,009 mL [09/30/22 1146]     Intake/Output Summary (Last 24 hours) at 09/30/2022 0700  Last data  filed at 09/30/2022 0600  Gross per 24 hour   Intake 1061 ml   Output 2070 ml   Net -1009 ml   Weight change:     Last 3 Weights for the past 72 hrs (Last 3 readings):   Weight   09/30/22 0400 80.7 kg (177 lb 14.6 oz)        PHYSICAL EXAM:                                              BP 104/63   Pulse 63   Temp 98.5 F (36.9 C) (Oral)   Resp 19   Ht 1.702 m (5' 7.01")   Wt 80.7 kg (177 lb 14.6 oz)   SpO2 97%   BMI 27.86 kg/m     No distress  Jvp nrml  Clear lungs  Rrr, S1, S2, no sign murmurs  Abd soft and nt  No le edema  Neuro intact      Tele: SR

## 2022-09-30 NOTE — Plan of Care (Addendum)
CNS: alert, Orientedx4. Spouse at bedside, refused reclining chair.   [facility]:  NSR to SB, normotensive. afebrile.   LR at 80mL/hr.   Resp: room air.   GI: poor oral intake. Complaints of abdominal discomfort and gas.   Food brought from home offered, containers in the patient fridge in nurtrition room as well.   GU: voids using urinal.   Skin: puncture site dry and not bleeding.   Trop peaked, daily labs only, with previous cardiac rehab orders.   For ICU downgrade.       Problem: Moderate/High Fall Risk Score >5  Goal: Patient will remain free of falls  Outcome: Progressing  Flowsheets (Taken 09/29/2022 2000)  Moderate Risk (6-13):   MOD-Consider activation of bed alarm if appropriate   MOD-Apply bed exit alarm if patient is confused   MOD-Floor mat at bedside (where available) if appropriate   MOD-Consider a move closer to [place]   MOD-Remain with patient during toileting   MOD-Place bedside commode and assistive devices out of sight when not in use   MOD-Utilize diversion activities   MOD-Perform dangle, stand, walk (DSW) prior to mobilization   MOD-Request PT/OT consult order for patients with gait/mobility impairment   MOD-include family in multidisciplinary POC discussions     Problem: Hemodynamic Status: Cardiac  Goal: Stable vital signs and fluid balance  Outcome: Progressing  Flowsheets (Taken 09/30/2022 0046)  Stable vital signs and fluid balance:   Assess signs and symptoms associated with cardiac rhythm changes   Monitor lab values     Problem: Inadequate Tissue Perfusion  Goal: Adequate tissue perfusion will be maintained  Outcome: Progressing  Flowsheets (Taken 09/30/2022 0046)  Adequate tissue perfusion will be maintained:   Monitor/assess lab values and report abnormal values   Monitor/assess neurovascular status (pulses, capillary refill, pain, paresthesia, paralysis, presence of edema)   Monitor/assess for signs of VTE (edema of calf/thigh redness, pain)   Monitor for signs and symptoms of a  pulmonary embolism (dyspnea, tachypnea, tachycardia, confusion)   Encourage/assist patient as needed to turn, cough, and perform deep breathing every 2 hours     Problem: Compromised Sensory Perception  Goal: Sensory Perception Interventions  Outcome: Progressing  Flowsheets (Taken 09/30/2022 0046)  Sensory Perception Interventions: Offload heels (boots or pillows), Pad bony prominences, Reposition q 2hrs, Q2 hour skin assessment under devices     Problem: Compromised Moisture  Goal: Moisture level Interventions  Outcome: Progressing  Flowsheets (Taken 09/30/2022 0046)  Moisture level Interventions: Moisture wicking products, Moisture barrier cream     Problem: Compromised Activity/Mobility  Goal: Activity/Mobility Interventions  Outcome: Progressing  Flowsheets (Taken 09/30/2022 0046)  Activity/Mobility Interventions: Pad bony prominences, TAP Seated positioning system when OOB, Promote PMP, Reposition q 2 hrs / turn clock, Offload heels (boots or pillows)     Problem: Compromised Nutrition  Goal: Nutrition Interventions  Outcome: Progressing  Flowsheets (Taken 09/30/2022 0046)  Nutrition Interventions: Discuss nutrition at MDR, I&Os document % meal eaten, Daily weights     Problem: Compromised Friction/Shear  Goal: Friction and Shear Interventions  Outcome: Progressing  Flowsheets (Taken 09/30/2022 0046)  Friction and Shear Interventions: HOB 30 degrees or less, Pad bony prominences/Heel Foam, TAP Seated positioning system when OOB, Off load heels     Problem: Safety  Goal: Patient will be free from injury during hospitalization  Flowsheets (Taken 09/30/2022 0052)  Patient will be free from injury during hospitalization:   Assess patient's risk for falls and implement fall prevention plan of care per policy  Provide and maintain safe environment   Ensure appropriate safety devices are available at the bedside   Use appropriate transfer methods   Include patient/ family/ care giver in decisions related to safety    [place]our[place] rounding  Goal: Patient will be free from infection during hospitalization  Flowsheets (Taken 09/30/2022 0052)  Free from Infection during hospitalization:   Assess and monitor for signs and symptoms of infection   Monitor lab/diagnostic results   Monitor all insertion sites (i.e. indwelling lines, tubes, urinary catheters, and drains)   Encourage patient and family to use good hand hygiene technique     Problem: Pain  Goal: Pain at adequate level as identified by patient  Flowsheets (Taken 09/30/2022 0052)  Pain at adequate level as identified by patient:   Identify patient comfort function goal   Assess for risk of opioid induced respiratory depression, including snoring/sleep apnea. Alert healthcare team of risk factors identified.   Assess pain on admission, during daily assessment and/or before any "as needed" intervention(s)   Reassess pain within 30-60 minutes of any procedure/intervention, per Pain Assessment, Intervention, Reassessment (AIR) Cycle   Evaluate if patient comfort function goal is met   Evaluate patient's satisfaction with pain management progress   Offer non-pharmacological pain management interventions

## 2022-10-01 ENCOUNTER — Telehealth (INDEPENDENT_AMBULATORY_CARE_PROVIDER_SITE_OTHER): Payer: Self-pay | Admitting: Family Medicine

## 2022-10-01 MED ORDER — METOPROLOL SUCCINATE ER 25 MG PO TB24
25.0000 mg | ORAL_TABLET | Freq: Every day | ORAL | 0 refills | Status: DC
Start: 2022-10-01 — End: 2022-10-07

## 2022-10-01 NOTE — Telephone Encounter (Signed)
[facility].  Patient was seen in hospital and prescribed the following medication by Dr. Mervin Kung and sent to [facility] which was closed today.      Requesting the [facility] - pharmacist was advised to call hospital doctor and they state they cannot reach him.       Pt is at the pharmacy now and requesting that PCP fill it for him.    Please advise      metoprolol succinate XL (TOPROL-XL) 25 MG 24 hr tablet 30 tablet 0 10/01/2022 10/31/2022    Sig - Route: Take 1 tablet (25 mg) by mouth daily - Oral

## 2022-10-01 NOTE — Telephone Encounter (Signed)
Rx sent to [facility]

## 2022-10-05 NOTE — Progress Notes (Signed)
[facility] patient admitted to: [facility] Admission/Discharge Dates:   1/17 to 09/30/2022     Reason for admission to hospital: STEMI (ST elevation myocardial infarction)     Name: [name] [name]    ### Patient Details  Date of Birth: 1955-10-16  MRN: [id number]    ### Encounter Details  Arrival Date: 09/29/2022 04:21 PM EST  Discharge Date: 09/30/2022 05:55 PM EST  Encounter ID: [id number] TCM1/18/2024   5:55:00PM    ### Related interaction  [place] Discharge Outreach (Post Discharge TCM Outreach 1) ([email or web address])    ### Questions     Question 1   Supplies   Do you have all the medical supplies needed for a successful recovery?  Press 1 for Yes,  Press 2 for No   Does not have supplies (Issue Panel: Post Discharge - TCM)     Question 2   Additional Questions   Do you have any additional questions or additional social needs that you would like to discuss with a member of your care team? Press 1 for Yes, Press 2 for No.    Additional Questions (Issue Panel: Post Discharge - TCM)    ### Required Interventions and Feedback     Call Status         Comments::     Writer contacted patient using Dari interpreter and his spouse, [name] [name], accepted the call. She reported that she was driving and requested to be contacted in 15 minutes. (edited by DN on 10/05/2022 04:05 PM EST)     Interpreter Used         Interpreter Used:     Yes (edited by DN on 10/05/2022 04:00 PM EST)    If yes, what language?:     Dari interpreter ID# [id number] (edited by DN on 10/05/2022 04:00 PM EST)    Pauline Aus, ACM-SW  Ambulatory Case Manager  [facility] for [place], [place], [place] [address]  O [phone]  [email or web address]

## 2022-10-05 NOTE — Progress Notes (Signed)
[facility] patient admitted to: [facility] Admission/Discharge Dates:   1/17 to 09/30/2022     Reason for admission to hospital: STEMI (ST elevation myocardial infarction)     Name: [facility]    ### Patient Details  Date of Birth: 1955-12-08  MRN: [id number]    ### Encounter Details  Arrival Date: 09/29/2022 04:21 PM EST  Discharge Date: 09/30/2022 05:55 PM EST  Encounter ID: [id number] TCM1/18/2024   5:55:00PM    ### Related interaction  [place] Discharge Outreach (Post Discharge TCM Outreach 1) ([email or web address])    ### Questions     Question 1   Supplies   Do you have all the medical supplies needed for a successful recovery?  Press 1 for Yes,  Press 2 for No   Does not have supplies (Issue Panel: Post Discharge - TCM)     Question 2   Additional Questions   Do you have any additional questions or additional social needs that you would like to discuss with a member of your care team? Press 1 for Yes, Press 2 for No.    Additional Questions (Issue Panel: Post Discharge - TCM)    ### Required Interventions and Feedback     Call Status         Comments::     Writer contacted patient using Dari interpreter and his spouse, Lucianne Lei, accepted the call. She reported that she was driving and requested to be contacted in 15 minutes. (edited by DN on 10/05/2022 04:05 PM EST)     Obtaining Rx - Action List         No Interventions Needed:     Yes (edited by DN on 10/05/2022 05:17 PM EST)     Follow Up Scheduled         Follow-Up Appointment Help  - Issues:     Unable to make appt. at desired time (edited by DN on 10/05/2022 05:17 PM EST)     Follow-Up Appointment  Help - Actions Taken         Encouraged Patient to Make F/U Appt Sooner:     Yes (edited by DN on 10/05/2022 05:17 PM EST)    Comments:     PCP on 1/29 and cardio on 10/19/22 (edited by DN on 10/05/2022 05:17 PM EST)     General Status           General Status - Issues:     Related to original diagnosis (edited by DN on 10/05/2022 05:13 PM EST)    Comments:     weakness (edited by DN on 10/05/2022 05:13 PM EST)     General Status - Actions Taken         Reviewed what to do if problem arises:     Yes (edited by DN on 10/05/2022 05:13 PM EST)     Discharge Instruction - Issues         List of Issues:     Diet (edited by DN on 10/05/2022 05:15 PM EST), Med list clarification (edited by DN on 10/05/2022 05:15 PM EST)     Discharge Instruction - Actions Taken         Reviewed Discharge Instructions:     Yes (edited by DN on 10/05/2022 05:15 PM EST)    Reviewed What To Do if Problem Arises:     Yes (edited by DN on 10/05/2022 05:15 PM EST)  Additional Questions         Comments:     Writer completed hospital follow up call with patient's spouse and patient. Writer provided AMI management education and reviewed AMI zones. In addition, cardiac healthy diet, decrease level of activities and daily monitor of vital signs encouraged. Patient scheduled to follow up with PCP on 1/29 and cardio on 10/19/22. Writer will outreach to PCP and cardiology office for an earlier times. Mrs. Darroch noted that patient was taking Brilinta once a day. Writer provide medication clarification and reviewed all the medications prescribed to patient.  (edited by DN on 10/05/2022 05:20 PM EST)     Interpreter Used         Interpreter Used:     Yes (edited by DN on 10/05/2022 04:00 PM EST)    If yes, what language?:     Dari interpreter ID# [id number] (edited by DN on 10/05/2022 04:00 PM EST)     Medication Questions - Issues         List of Issues:     Med List clarification (edited by DN on 10/05/2022 05:17 PM EST)     Medication Questions - Actions List         Answered Medication Questions:     Yes (edited by DN on 10/05/2022 05:17 PM EST)    Discussed Purpose of Medications:     Yes (edited by DN on 10/05/2022 05:17 PM EST)    Pauline Aus, ACM-SW  Ambulatory Case  Manager  [facility] for [place], [place], [place] [address]  [place]  [email or web address]

## 2022-10-06 NOTE — Progress Notes (Signed)
AMBULATORY CARE MANAGEMENT Outreach Call    11:50 am:  Probation officer received a call from patient's spouse reported of patient experiencing right hand pain. Denied of any chest discomfort or shortness of breath.    11:55 am:  Writer contacted Cardiology office and request to speak with a nurse regarding pt symptom and medication education needed.  Writer also requested an earlier appointment for patient.   The front desk liaison was able to conference call patient's spouse, Lucianne Lei, regarding an appointment tomorrow at 2pm.    12:11pm:  Writer sent an email to patient and spouse with cardiology appointment information.    Pauline Aus, ACM-SW  Ambulatory Case Manager  [facility] for [place], [place], [place] [address]  O [phone]  [email or web address]

## 2022-10-07 ENCOUNTER — Ambulatory Visit (INDEPENDENT_AMBULATORY_CARE_PROVIDER_SITE_OTHER): Payer: No Typology Code available for payment source | Admitting: Registered Nurse

## 2022-10-07 ENCOUNTER — Encounter (INDEPENDENT_AMBULATORY_CARE_PROVIDER_SITE_OTHER): Payer: Self-pay | Admitting: Registered Nurse

## 2022-10-07 VITALS — BP 123/82 | HR 61 | Resp 16 | Ht 67.01 in | Wt 166.0 lb

## 2022-10-07 DIAGNOSIS — I4729 Other ventricular tachycardia: Secondary | ICD-10-CM

## 2022-10-07 DIAGNOSIS — I2510 Atherosclerotic heart disease of native coronary artery without angina pectoris: Secondary | ICD-10-CM

## 2022-10-07 DIAGNOSIS — M79601 Pain in right arm: Secondary | ICD-10-CM

## 2022-10-07 DIAGNOSIS — I213 ST elevation (STEMI) myocardial infarction of unspecified site: Secondary | ICD-10-CM

## 2022-10-07 DIAGNOSIS — I959 Hypotension, unspecified: Secondary | ICD-10-CM

## 2022-10-07 LAB — ECG 12-LEAD
Atrial Rate: 60 {beats}/min
IHS MUSE NARRATIVE AND IMPRESSION: NORMAL
P Axis: 66 degrees
P-R Interval: 186 ms
Q-T Interval: 400 ms
QRS Duration: 84 ms
QTC Calculation (Bezet): 400 ms
R Axis: -12 degrees
T Axis: -32 degrees
Ventricular Rate: 60 {beats}/min

## 2022-10-07 MED ORDER — TICAGRELOR 90 MG PO TABS
90.0000 mg | ORAL_TABLET | Freq: Two times a day (BID) | ORAL | 3 refills | Status: DC
Start: 2022-10-07 — End: 2022-11-09

## 2022-10-07 MED ORDER — METOPROLOL SUCCINATE ER 25 MG PO TB24
25.0000 mg | ORAL_TABLET | Freq: Every day | ORAL | 3 refills | Status: DC
Start: 2022-10-07 — End: 2022-11-09

## 2022-10-07 NOTE — Progress Notes (Signed)
[place] CARDIOLOGY OFFICE VISIT      ASSESSMENT & PLAN:   1. ST elevation myocardial infarction (STEMI), unspecified artery  ECG 12 lead      2. Coronary artery disease involving native coronary artery of native heart without angina pectoris  ticagrelor (BRILINTA) 90 MG Tab    metoprolol succinate XL (TOPROL-XL) 25 MG 24 hr tablet      3. Right arm pain        4. NSVT (nonsustained ventricular tachycardia)        5. Hypotensive episode             CAD/STEMI. S/p PCI x3 to LCx on 12/20. Cont DAPT ASA 81 mg and brilinta 90 mg bid, high-intensity statin. Metoprolol has been added to his regimen.  Cath with mild CAD in LAD and RCA.   Outpatient cardiac rehab  Confirmed that he is taking all his GDMT and review brilinta bid dosing. He denied any myalgia, fatigue, dizziness, SOB or bleeding.     Right arm pain: s/p 2 cardiac catheterizations with right radial artery access. He has +2 pulses bilateral in radial pulses.  He has palpable pulses in his right brachial and ulna.  He has good coloring and <2 sec cap refill, hand is warm and pink. No further testing warranted at this time.  If the pain returns and does not improve with the massage gun, seek assistance at the emergency room.  Likely due to immobility with multiple catheterizations requiring bracing and immobility.      *NSVT - denies any palpitations.     *Hypotension - normotensive today in office, no presyncope or syncope.     Return in about 6 weeks (around 11/18/2022) for visit with Dr. Tamala Julian.    HISTORY OF PRESENT ILLNESS:  I had the pleasure of seeing [name] [name] today in a cardiac follow up office visit for hospital follow up s/p cardiac 09/29/22, coronary artery disease management and right arm pain. He is accompanied by his wife and son. He declined video interpreter and requested son interpret.     [name] [name] is a 67 y.o. male with a past medical history significant for coronary artery disease, diabetes, hyperlipidemia.    Patient presented to the  hospital on 09/01/2022 with a STEMI and was taken to the Cath Lab where he had a PCI with 3 DES to his L circumflex.  He was then discharged on 09/14/22.  On 09/23/22 he was seen in an office visit follow-up on and was stable with no complications.  On 09/29/2022, he/his family called stating he was having chest pain for 3 days and it was highly recommended to go back to the emergency room.  Upon arrival a STEMI was called, he was transferred to [facility] for cardiac cath which showed no flow limitations.  Per hospital notes, medications were reviewed with the patient. He was then discharged on 1/18. During a phone call with case management to the family on 10/05/22 patient's wife stated he has only been taking Brilinta once daily and CM re-educated to take twice a day.  On 1/24 he called again and reported right arm pain for which he is being seen today.    At today's visit [name] [name] is doing well.  He denies chest pain, shortness of breath, edema, palpitations, presyncope, or syncope.   He reports right arm pain since his discharge from hospital.  He was previously very active.  The pain was unusually bad yesterday, severity 7/10.  It improved with use of the massage gun at home.  It is currently 2/10 severity.     Cardiographics:  TTE(09/29/22):  LVEF 49%, mildly dilated LA and RA, mild AR & MR & TR, ascending aorta dilated at 4.1 cm      TTE 09/12/22:  LVEF 59%, mildly dilate RA, mild MR & TR, ascending aorta 4.2 cm     Cardiac cath 09/29/22:  LEFT CORONARY ARTERY:  Left main is normal.  Left main gives rise to left anterior descending and left circumflex.  The left circumflex shows 25%-40% stenosis proximally.  The stent in the left circumflex appears wide open and the flow in the left circumflex is TIMI-3. LAD shows a 25%-40% stenosis in the mid segment.  The flow in the LAD is TIMI 3.     RIGHT CORONARY:  Is is a large, dominant vessel.  It shows 25% to 40% ostial stenosis.  The flow in the RCA is  TIMI 3.     LEFT VENTRICULOGRAPHY:  Shows mildly reduced to borderline ventricular systolic function, ejection fraction 45%-50%.     HEMODYNAMICS:  LV pressure is 97/15, aortic pressure is 89/50.     IMPRESSION:  1.  No significant coronary artery disease.  2.  Borderline to mildly reduced left ventricular systolic function, ejection fraction 45%-50%.  3.  Mildly elevated left ventricular diastolic pressure.    Cardiac cath September 25, 2023:   Post-Op Diagnosis Codes:     * STEMI (ST elevation myocardial infarction) Inferolateral STEMI  100% occluded Lcx    Findings:   LM       Angiographically normal  LAD     40% mid stenosis  LCx      20% proximal stenosis              100% mid stenosis with distal TIMI 0 flow  RCA     20% mid stenosis  LV        LVEF 50%; mid inferior wall hypokinesis; LVEDP 15 mmHg; no significant gradient across aortic valve        Complications:   None  Impression     Inferolateral STEMI due to 100% occluded mid LCx  S/p DES x 3 LCx     Plan  Admit to ICU  DAPT: ASA 81 mg and brilinta 90 mg bid  High dose statin: lipitor 80 mg qhs  Hold off on BB for now given borderline blood pressure  Check echo  Serial cardiac enzymes    ECG: sinus rhythm, inferior myocardial infarction, criteria for lateral MI no longer present, T wave inversion in lateral leads are no longer present.     PMH:   Patient Active Problem List    Diagnosis Date Noted    ST elevation myocardial infarction (STEMI), unspecified artery 24-Sep-2022    STEMI (ST elevation myocardial infarction) 24-Sep-2022    Uses hearing aid 01/11/2022    Bilateral chronic knee pain 01/11/2022        MEDICATIONS:     Current Outpatient Medications   Medication Sig Dispense Refill    aspirin EC 81 MG EC tablet Take 1 tablet (81 mg) by mouth daily 30 tablet 1    atorvastatin (LIPITOR) 80 MG tablet Take 1 tablet (80 mg) by mouth nightly 180 tablet 1    ferrous sulfate 324 (65 FE) MG Tablet Delayed Response Take 1 tablet (324 mg) by mouth every morning with  breakfast 60 tablet 0    pantoprazole (PROTONIX) 40 MG tablet  Take 1 tablet (40 mg) by mouth every morning before breakfast 90 tablet 0    vitamin D, ergocalciferol, (DRISDOL) 50000 UNIT Cap Take 1 capsule (50,000 Units) by mouth once a week for the next 8 weeks. 8 capsule 0    albuterol (PROVENTIL) (2.5 MG/3ML) 0.083% nebulizer solution Take 3 mLs (2.5 mg) by nebulization every 4 (four) hours as needed for Wheezing or Shortness of Breath (cough) 75 mL 0    fluticasone (FLONASE) 50 MCG/ACT nasal spray 2 sprays by Nasal route daily for 7 days 11.1 mL 0    metoprolol succinate XL (TOPROL-XL) 25 MG 24 hr tablet Take 1 tablet (25 mg) by mouth daily 90 tablet 3    ticagrelor (BRILINTA) 90 MG Tab Take 1 tablet (90 mg) by mouth every 12 (twelve) hours 180 tablet 3     No current facility-administered medications for this visit.        SH:   Social History     Tobacco Use    Smoking status: Never    Smokeless tobacco: Never   Vaping Use    Vaping Use: Never used   Substance Use Topics    Alcohol use: Not Currently    Drug use: Never       REVIEW OF SYSTEMS: All other systems reviewed and negative except as above.    PHYSICAL EXAMINATION   General Appearance: A well-appearing male in no acute distress.   Vital Signs: BP 123/82 (BP Site: Left arm, Patient Position: Sitting, Cuff Size: Medium)   Pulse 61   Resp 16   Ht 1.702 m (5' 7.01")   Wt 75.3 kg (166 lb)   SpO2 97%   BMI 25.99 kg/m    HEENT: Sclera anicteric, conjunctiva without pallor, moist mucous membranes, normal dentition.   Neck: Supple without jugular venous distention. Normal carotid upstrokes without bruits.  Chest: Clear to auscultation bilaterally with good air movement and respiratory effort and no wheezes, rales, or rhonchi  Cardiovascular: Normal S1 and physiologically split S2 without murmurs, gallops or rub. PMI of normal size and nondisplaced.   Abdomen: Soft, nontender. No organomegaly.  No pulsatile masses or bruits.    Extremities: Warm without  edema. All peripheral pulses including radial are full and equal. Right ulna and brachial pulses are strong and palpable.  <2 second cap refill fingers on right hand. Pink and warm bilateral hands.    Skin: No rash, xanthoma or xanthelasma.   Neuro: Alert and oriented x3. Grossly intact. Strength is symmetrical. Normal mood and affect.       Basic Metabolic Profile   Lab Results   Component Value Date    NA 140 09/30/2022    K 4.2 09/30/2022    BUN 12.0 09/30/2022    CREAT 0.8 09/30/2022    MG 1.8 09/14/2022    CA 8.6 09/30/2022    GLU 90 09/30/2022         Cardiac Biomarkers   No results found for: "TROPONIN", "BNP"     CBC with Diff   Lab Results   Component Value Date    WBC 6.81 09/30/2022    HGB 12.7 09/30/2022    HCT 37.6 09/30/2022    PLT 232 09/30/2022         Cholesterol Panel   Lab Results   Component Value Date    CHOL 137 09/12/2022    HDL 38 (L) 09/12/2022    LDL 88 09/12/2022    TRIG 55 09/12/2022  Endocrine   Lab Results   Component Value Date    HGBA1C 5.2 09/12/2022    HGBA1C 5.4 08/16/2022    HGBA1C 5.1 01/11/2022         Coagulation Studies   Lab Results   Component Value Date    INR 1.1 09/29/2022         ----------------------------  Filbert Schilder, AGNP-C  [facility] Card[facility]   Tel.: [phone], Fax: [phone]  [place], [place], [place] [address]  [address], [place], [place] [address]  [address], [place], [place] [address]  [place], [place], [place], [place] [address]  _____________________________    The physician's plan of care was implemented.

## 2022-10-07 NOTE — Patient Instructions (Signed)
Expect a call from cardiac rehabilitation.    Schedule follow up with Dr. Tamala Julian in 6-8 weeks.

## 2022-10-11 ENCOUNTER — Encounter (INDEPENDENT_AMBULATORY_CARE_PROVIDER_SITE_OTHER): Payer: Self-pay | Admitting: Family Medicine

## 2022-10-11 ENCOUNTER — Ambulatory Visit (INDEPENDENT_AMBULATORY_CARE_PROVIDER_SITE_OTHER): Payer: No Typology Code available for payment source | Admitting: Family Medicine

## 2022-10-11 VITALS — BP 108/64 | HR 56 | Temp 97.9°F | Ht 66.0 in | Wt 165.8 lb

## 2022-10-11 DIAGNOSIS — Z23 Encounter for immunization: Secondary | ICD-10-CM

## 2022-10-11 DIAGNOSIS — Z0000 Encounter for general adult medical examination without abnormal findings: Secondary | ICD-10-CM

## 2022-10-11 DIAGNOSIS — Z125 Encounter for screening for malignant neoplasm of prostate: Secondary | ICD-10-CM

## 2022-10-11 DIAGNOSIS — I252 Old myocardial infarction: Secondary | ICD-10-CM

## 2022-10-11 DIAGNOSIS — R972 Elevated prostate specific antigen [PSA]: Secondary | ICD-10-CM

## 2022-10-11 DIAGNOSIS — I2121 ST elevation (STEMI) myocardial infarction involving left circumflex coronary artery: Secondary | ICD-10-CM

## 2022-10-11 DIAGNOSIS — M79601 Pain in right arm: Secondary | ICD-10-CM

## 2022-10-11 DIAGNOSIS — Z1159 Encounter for screening for other viral diseases: Secondary | ICD-10-CM

## 2022-10-11 DIAGNOSIS — D500 Iron deficiency anemia secondary to blood loss (chronic): Secondary | ICD-10-CM

## 2022-10-11 DIAGNOSIS — Z974 Presence of external hearing-aid: Secondary | ICD-10-CM

## 2022-10-11 NOTE — Progress Notes (Signed)
[facility]                       Date of Exam: 10/11/2022 10:27 AM        Patient ID: [name] [name] is a 67 y.o. male.  Attending Physician: Anson Fret, MD        Chief Complaint:    Chief Complaint   Patient presents with    Annual Exam     Fasting                  HPI:    Visit Type: Preventative Visit    Patient is here for his annual exam. We have [place] interpreter. He is here with his wife [name] [name].     Reported Health: fair health  Interval Events: Hospitalization for STEMI S/p PCI x3 to LCx on 12/20.    Patient reports that he continues to have pain on the right arm. Has been going since his hospital stay. Was thought to be related to multiple cath, has had some recurrence of pain 2 days ago, no pain currently.  Denies chest pain or shortness of breath.     He was complaining of blood in the rectum last visit. He had seen GI but declined colonoscopy. Not having blood in stool. Has been treated with iron for anemia, has follow up with GI next week.     Notes that hearing aid has not been working for over a year. Notes that referral was not covered     Work Status: retired  Diet: balanced diet   Exercise: none     Dental: dentist visit > 1 year ago  Vision: eye exam > 1 year ago  Hearing: normal hearing    Reproductive Health: not currently sexually active  STD concerns: denies knowledge of risky exposure  Other screening tests: no prior PSA testing              Problem List:    Patient Active Problem List   Diagnosis    Uses hearing aid    Bilateral chronic knee pain    ST elevation myocardial infarction (STEMI), unspecified artery    STEMI (ST elevation myocardial infarction)             Current Meds:    Outpatient Medications Marked as Taking for the 10/11/22 encounter (Office Visit) with Adalyne Lovick, Yolanda Bonine, MD   Medication Sig Dispense Refill    aspirin EC 81 MG EC tablet Take 1 tablet (81 mg) by mouth daily 30 tablet 1    atorvastatin (LIPITOR) 80 MG tablet  Take 1 tablet (80 mg) by mouth nightly 180 tablet 1    ferrous sulfate 324 (65 FE) MG Tablet Delayed Response Take 1 tablet (324 mg) by mouth every morning with breakfast 60 tablet 0    metoprolol succinate XL (TOPROL-XL) 25 MG 24 hr tablet Take 1 tablet (25 mg) by mouth daily 90 tablet 3    ticagrelor (BRILINTA) 90 MG Tab Take 1 tablet (90 mg) by mouth every 12 (twelve) hours 180 tablet 3    vitamin D, ergocalciferol, (DRISDOL) 50000 UNIT Cap Take 1 capsule (50,000 Units) by mouth once a week for the next 8 weeks. 8 capsule 0          Allergies:    No Known Allergies          Past Surgical History:    Past Surgical History:  Procedure Laterality Date    LEFT HEART CATH POSS PCI Left 09/11/2022    Procedure: Left Heart Cath Poss PCI;  Surgeon: Nelda Bucks, MD;  Location: [facility];  Service: Cardiovascular;  Laterality: Left;    LEFT HEART CATH POSS PCI Left 09/29/2022    Procedure: Left Heart Cath Poss PCI;  Surgeon: Sande Brothers, MD;  Location: [facility];  Service: Cardiovascular;  Laterality: Left;    NOSE SURGERY             Family History:    History reviewed. No pertinent family history.        Social History:    Social History     Tobacco Use    Smoking status: Never    Smokeless tobacco: Never   Vaping Use    Vaping Use: Never used   Substance Use Topics    Alcohol use: Not Currently    Drug use: Never           The following sections were reviewed this encounter by the provider:   Tobacco  Allergies  Meds  Problems  Med Hx  Surg Hx  Fam Hx             ROS:    Review of Systems   Constitutional:  Negative for chills and fever.   Respiratory:  Negative for shortness of breath.    Cardiovascular:  Negative for chest pain.   Gastrointestinal:  Negative for nausea and vomiting.                Vital Signs:    BP 108/64   Pulse (!) 56   Temp 97.9 F (36.6 C) (Temporal)   Ht 1.676 m (5\' 6" )   Wt 75.2 kg (165 lb 12.8 oz)   SpO2 97%   BMI 26.76 kg/m          Physical Exam:     Physical Exam  Constitutional:       Appearance: Normal appearance.   HENT:      Right Ear: Tympanic membrane normal.      Left Ear: Tympanic membrane normal.      Mouth/Throat:      Mouth: Mucous membranes are moist.   Eyes:      Extraocular Movements: Extraocular movements intact.   Cardiovascular:      Rate and Rhythm: Regular rhythm. Bradycardia present.   Pulmonary:      Effort: Pulmonary effort is normal. No respiratory distress.      Breath sounds: Normal breath sounds.   Abdominal:      General: Bowel sounds are normal.      Tenderness: There is no abdominal tenderness.   Musculoskeletal:      Comments: Right arm: no swelling, no tenderness. No erythema. Good range of motion.    Neurological:      General: No focal deficit present.      Mental Status: He is alert and oriented to person, place, and time.   Psychiatric:         Mood and Affect: Mood normal.                    Assessment / Plan:    1. Well adult exam  Discussed about diet, will work with cardiac rehab on exercise.     2. ST elevation myocardial infarction involving left circumflex coronary artery  Acute, discussed need to be on aspirin indefinitely, will need to be on ticagrelor for  at least a year. Will get cardiac rehab referral started, info given to make an appointment.   - Ambulatory referral to Cardiac Rehabilitation    3. Iron deficiency anemia due to chronic blood loss  Reviewed recent labs, anemia has improved, reviewed GI notes, due for follow up next week. No prior colonoscopy, will have patient discuss with GI.     4. Right arm pain  Recurrent, possibly related to cath, no pain currently, monitor for now.   Can use ibuprofen 200-400mg  twice a day prn for pain. Can trial cold compress prn.   - Ambulatory referral to Cardiac Rehabilitation    5. Uses hearing aid  Referral to [facility] was already sent, will check in with referral coordinator.     6. Screening for prostate cancer  Will get screening done.  - PSA    7. Need for hepatitis C  screening test  Will get screening done.  - Hepatitis C (HCV) antibody, Total    8. Need for vaccination  - Flu vaccine QUADRIVALENT (PF) 6 months and older (FLULAVAL/FLUARIX)                Follow-up:    Return in about 2 months (around 12/10/2022) for CAD and right arm pain.         [address] Honesti Seaberg III, MD

## 2022-10-11 NOTE — Progress Notes (Signed)
Have you seen any specialists/other providers since your last visit with [place]?    Yes cardiology    Do you agree to telemedicine visit?  No    Arm preference verified?   Yes, no preference    Health Maintenance Due   Topic Date Due    Colorectal Cancer Screening  Never done    Advance Directive on File  Never done    Annual Exam  Never done    Shingrix Vaccine 50+ (1) Never done    Tetanus Ten-Year  Never done    COVID-19 Vaccine (1) Never done    HEPATITIS C SCREENING  Never done    Pneumonia Vaccine Age 60+ (1 - PCV) Never done    INFLUENZA VACCINE  Never done

## 2022-10-11 NOTE — Progress Notes (Signed)
AMBULATORY CARE MANAGEMENT Outreach Call    Per chart review, patient was seen by cardiology office on 10/07/2022 and scheduled to follow up with PCP on 10/11/2022.    Writer made outreach to patient's spouse as a follow up missed call on 10/08/22. She accepted the call and reported of patient having ongoing pain in his hand. Mrs. Putman confirmed that she was with patient at PCP office. Writer encouraged Mrs. Pape to notify PCP regarding the symptom and specifying the frequency and intensity of the pain patient continues to experience since discharging from the hospital.    Pauline Aus, ACM-SW  Ambulatory Case Manager  [facility] for [place], [place], [place] [address]  O [phone]  [email or web address]

## 2022-10-12 NOTE — Progress Notes (Signed)
AMBULATORY CARE MANAGEMENT Higher education careers adviser received a call from patient's spouse informing of difficulty with obtaining patient's medication (metoprolol). Writer contacted the following pharmacies to find out where the prescription was and the barrier to refill. The first pharmacy [facility]) informed that the prescription could not be fill due to the prescription being filled somewhere. [facility] not [facility]. Writer made an outreach to [facility] and notified the pharmacist. She reported plans to follow up with patient's insurance and will notify the spouse.    [place], [address]  Phone: [phone]   Fax: [phone]     [id number] - [place], [place], [address], [address] [facility]  Phone: [phone]   Fax: [phone]   [id number], [place]  Phone: [phone]   Fax: [phone]     Pauline Aus, ACM-SW  Ambulatory Case Manager  [facility] for [place], [place],  [address]  O [phone]  [email or web address]

## 2022-10-13 LAB — HEPATITIS C ANTIBODY, TOTAL: HCV AB: NONREACTIVE

## 2022-10-13 LAB — INTERPRETATION:

## 2022-10-15 ENCOUNTER — Encounter (INDEPENDENT_AMBULATORY_CARE_PROVIDER_SITE_OTHER): Payer: Self-pay | Admitting: Family Medicine

## 2022-10-16 LAB — PSA TOTAL: Prostate Specific Antigen, Total: 4.68 ng/mL — ABNORMAL HIGH

## 2022-10-18 ENCOUNTER — Telehealth: Payer: Self-pay

## 2022-10-18 NOTE — Telephone Encounter (Signed)
Called pt regarding his initial intake interview for [facility] on 10/20/2022 at 1615.  No answer; lvm w/ call back number [phone].

## 2022-10-19 ENCOUNTER — Ambulatory Visit (INDEPENDENT_AMBULATORY_CARE_PROVIDER_SITE_OTHER): Payer: No Typology Code available for payment source | Admitting: Gastroenterology

## 2022-10-19 ENCOUNTER — Encounter (INDEPENDENT_AMBULATORY_CARE_PROVIDER_SITE_OTHER): Payer: Self-pay | Admitting: Gastroenterology

## 2022-10-19 ENCOUNTER — Other Ambulatory Visit (INDEPENDENT_AMBULATORY_CARE_PROVIDER_SITE_OTHER): Payer: Self-pay | Admitting: Gastroenterology

## 2022-10-19 VITALS — BP 110/69 | HR 67 | Temp 97.9°F | Wt 168.0 lb

## 2022-10-19 DIAGNOSIS — R143 Flatulence: Secondary | ICD-10-CM

## 2022-10-19 DIAGNOSIS — K625 Hemorrhage of anus and rectum: Secondary | ICD-10-CM

## 2022-10-19 DIAGNOSIS — K5904 Chronic idiopathic constipation: Secondary | ICD-10-CM

## 2022-10-19 DIAGNOSIS — I213 ST elevation (STEMI) myocardial infarction of unspecified site: Secondary | ICD-10-CM

## 2022-10-19 DIAGNOSIS — D500 Iron deficiency anemia secondary to blood loss (chronic): Secondary | ICD-10-CM

## 2022-10-19 DIAGNOSIS — R142 Eructation: Secondary | ICD-10-CM

## 2022-10-19 DIAGNOSIS — R141 Gas pain: Secondary | ICD-10-CM

## 2022-10-19 NOTE — Progress Notes (Signed)
[address]  [place], [place] [address]  Appointments: Phone [phone], Fax [phone]  GASTROENTEROLOGY & HEPATOLOGY OUTPATIENT VISIT       Reason for Visit:   Iron deficiency anemia    History of Present Illness:  [name] [name] is a 67 y.o. male who is referred by Si[place], Samella Parr R * for consultation of the above reason(s)  Daughter translating  Wife also present    CAD DES x 3 08/2022    Heartburn/reflux? No   Dysphagia? No   Push/strain for bm? Yes sometimes  Feel like incompletely empty? no    blood in stools? Not anymore but previously saw in tissue paper    Prior egd no   Prior colon no   No fam hx of crc  No fam hx of colon polyps                  Past Medical History  Past Medical History:   Diagnosis Date    Seasonal allergic rhinitis     Vitamin D deficiency        Past Surgical History  Past Surgical History:   Procedure Laterality Date    LEFT HEART CATH POSS PCI Left 09/11/2022    Procedure: Left Heart Cath Poss PCI;  Surgeon: Nelda Bucks, MD;  Location: [facility];  Service: Cardiovascular;  Laterality: Left;    LEFT HEART CATH POSS PCI Left 09/29/2022    Procedure: Left Heart Cath Poss PCI;  Surgeon: Sande Brothers, MD;  Location: [facility];  Service: Cardiovascular;  Laterality: Left;    NOSE SURGERY         Allergies  No Known Allergies    Prior to Admission medications    Medication Sig Start Date End Date Taking? Authorizing Provider   aspirin EC 81 MG EC tablet Take 1 tablet (81 mg) by mouth daily 09/15/22 11/14/22  Yousuf, Munib A, MD   atorvastatin (LIPITOR) 80 MG tablet Take 1 tablet (80 mg) by mouth nightly  09/23/22 09/18/23  Renard Hamper, MD   ferrous sulfate 324 (65 FE) MG Tablet Delayed Response Take 1 tablet (324 mg) by mouth every morning with breakfast 09/14/22 11/13/22  Yousuf, Munib A, MD   metoprolol succinate XL (TOPROL-XL) 25 MG 24 hr tablet Take 1 tablet (25 mg) by mouth daily 10/07/22   Gilford Raid, NP   pantoprazole (PROTONIX) 40 MG tablet Take 1 tablet (40 mg) by mouth every morning before breakfast  Patient not taking: Reported on 10/11/2022 09/17/22 12/16/22  Nkechi Linehan A, DO   ticagrelor (BRILINTA) 90 MG Tab Take 1 tablet (90 mg) by mouth every 12 (twelve) hours 10/07/22   Gilford Raid, NP   vitamin D, ergocalciferol, (DRISDOL) 50000 UNIT Cap Take 1 capsule (50,000 Units) by mouth once a week for the next 8 weeks. 10/05/22 11/30/22  Gayleen Orem, MD       Family History  History reviewed. No pertinent family history.    Social History  Social History     Tobacco Use    Smoking status: Never    Smokeless tobacco: Never   Vaping Use    Vaping Use: Never used   Substance Use Topics    Alcohol use: Not Currently    Drug use: Never       Review of Systems:  Review of Systems     Skin: Denies jaundice        Vital Signs:     Current Outpatient Medications   Medication Sig Dispense Refill    aspirin EC 81  MG EC tablet Take 1 tablet (81 mg) by mouth daily 30 tablet 1    atorvastatin (LIPITOR) 80 MG tablet Take 1 tablet (80 mg) by mouth nightly 180 tablet 1    ferrous sulfate 324 (65 FE) MG Tablet Delayed Response Take 1 tablet (324 mg) by mouth every morning with breakfast 60 tablet 0    metoprolol succinate XL (TOPROL-XL) 25 MG 24 hr tablet Take 1 tablet (25 mg) by mouth daily 90 tablet 3    pantoprazole (PROTONIX) 40 MG tablet Take 1 tablet (40 mg) by mouth every morning before breakfast 90 tablet 0    ticagrelor (BRILINTA) 90 MG Tab Take 1 tablet (90 mg) by mouth every 12 (twelve) hours 180 tablet 3    vitamin D, ergocalciferol, (DRISDOL) 50000 UNIT Cap Take 1 capsule (50,000 Units) by mouth once  a week for the next 8 weeks. 8 capsule 0     No current facility-administered medications for this visit.        Objective:   BP 110/69   Pulse 67   Temp 97.9 F (36.6 C)   Wt 76.2 kg (168 lb)   BMI 27.12 kg/m   Physical Exam   General Appearance:    Alert, no distress, appears stated age   Lungs:     respirations unlabored, good respiratory effort    Abdomen:     non-distended, Rectal exam deferred   Neurologic:   Alert and oriented     Laboratory and Data Review:    Recent Labs     09/30/22  0417 09/29/22  1502 09/14/22  0356   WBC 6.81 6.71 6.69   Hgb 12.7 12.9 11.5*   Hematocrit 37.6 38.9 35.9*   Platelets 232 257 177       Recent Labs     09/29/22  1502 09/11/22  1905   PT 13.2* 13.7*   PT INR 1.1 1.2*       Recent Labs     09/30/22  0417 09/29/22  1502 09/14/22  0356 09/13/22  1658 09/13/22  0429 09/12/22  0426   Sodium 140 138 138  --    < > 130*   Potassium 4.2 4.3 3.9  --    < > 3.7   Chloride 109 105 110  --    < > 104   CO2 25 25 22   --    < > 20   BUN 12.0 13.0 11.0  --    < > 14.0   Creatinine 0.8 0.8 0.9  --    < > 0.7   Calcium 8.6 9.0 7.7*  --    < > 7.6*   Albumin 3.4* 3.8  --   --   --  3.0*   Protein, Total 5.9* 6.8  --   --   --  5.1*   Bilirubin, Total 0.7 0.6  --   --   --  0.6   Alkaline Phosphatase 60 66  --   --   --  47   ALT 18 22  --   --   --  37   AST (SGOT) 15 18  --   --   --  211*   Glucose 90 97 87  --    < > 138*   Ferritin  --   --   --  234.70  --   --     < > = values in this interval not displayed.  Rads:   XR Chest AP Portable    Result Date: 09/29/2022  Mild cardiomegaly. Reuben Likes, MD 09/29/2022 7:25 PM       Assessment/Recommendations:     1. Iron deficiency anemia due to chronic blood loss    2. Rectal bleeding    3. ST elevation myocardial infarction (STEMI), unspecified artery    4. Chronic idiopathic constipation    5. Flatulence, eructation and gas pain         # IDA could be from blood loss (gastritis, malignancy, AVMs, PUD) or reduced absorption (celiac  dz, atrophic gastritis, Hpylori)    Discussed this with patient in detail  Would not recommend pursuing until at least 6 months of DAPT completed and then thereafter will need cardiology clearance      The endoscopy procedure was explained to the patient and the role for investigating the source     Risks/benefits/alternatives were discussed including but not limiting: bleeding, infection, perforation, sore throat/missing teeth, and anesthesia complications (such as cardiac or respiratory arrest/heart/lung issues). The patient voiced understanding and agrees to proceed.    The colonoscopy procedure was explained to the patient, including rationale of removing polyps before they become cancerous     Risks/benefits/alternatives were discussed including but not limiting:bleeding, infection, perforation, injury to spleen/liver, missed lesions, and anesthesia complications (such as heart/lung issues). Prep instructions and encourage to drink entire prescribed regimen also discussed. The patient voiced understanding and agrees to proceed.    -Contingency on VCE per ASGE guidelines         #Rectal bleeding  Ddx includes but not limited to: hemorrhoids, anal fissures, mass/lesion/polyps, proctitis, rectal ulcers, AVMs, IBD or CRC or diverticular bleeding    The colonoscopy procedure was explained to the patient  Risks/benefits/alternatives were discussed including but not limiting:bleeding, infection, perforation, injury to spleen/liver, missed lesions, and anesthesia complications (such as heart/lung issues). Prep instructions and encourage to drink entire prescribed regimen also discussed. The patient voiced understanding and agrees to proceed.        Recommendations:  -colonoscopy   -high fiber diet: whole food plant based diet, 5 servings of fruits and vegetables daily; pears apples prunes kiwifruit mangoes papaya beans lentil legumes  -adequate water intake; mineral water has magnesium and that can also facilitate  bowel movements but it is not necessary to drink mineral water   -remain active/aerobic exercise  -elevate feet with a stool or using a toilet that is lower to the ground  -Miralax: 1 dose (1 cap or 1 packet) daily for 2 weeks; if no drastic improvement, increase to 1 dose twice a day  -CONSISTENCY IS KEY!            Summary:       1. Iron deficiency anemia due to chronic blood loss  - Soluble Transferrin Receptor; Future  - CBC and differential; Future  - Ferritin; Future  - IRON PROFILE; Future  - C Reactive Protein; Future  - Vitamin B12; Future  - Folate; Future  - Intrinsic Factor Blocking Antibody; Future  - Anti-parietal Cell Antibody; Future  - Soluble Transferrin Receptor  - CBC and differential  - Ferritin  - IRON PROFILE  - C Reactive Protein  - Vitamin B12  - Folate  - Intrinsic Factor Blocking Antibody  - Anti-parietal Cell Antibody    2. Rectal bleeding  - Soluble Transferrin Receptor; Future  - CBC and differential; Future  - Ferritin; Future  - IRON PROFILE; Future  - C Reactive  Protein; Future  - Vitamin B12; Future  - Folate; Future  - Intrinsic Factor Blocking Antibody; Future  - Anti-parietal Cell Antibody; Future  - Soluble Transferrin Receptor  - CBC and differential  - Ferritin  - IRON PROFILE  - C Reactive Protein  - Vitamin B12  - Folate  - Intrinsic Factor Blocking Antibody  - Anti-parietal Cell Antibody    3. ST elevation myocardial infarction (STEMI), unspecified artery  - Soluble Transferrin Receptor; Future  - CBC and differential; Future  - Ferritin; Future  - IRON PROFILE; Future  - C Reactive Protein; Future  - Vitamin B12; Future  - Folate; Future  - Intrinsic Factor Blocking Antibody; Future  - Anti-parietal Cell Antibody; Future  - Soluble Transferrin Receptor  - CBC and differential  - Ferritin  - IRON PROFILE  - C Reactive Protein  - Vitamin B12  - Folate  - Intrinsic Factor Blocking Antibody  - Anti-parietal Cell Antibody    4. Chronic idiopathic constipation    5. Flatulence,  eructation and gas pain     There are no discontinued medications.       Thank you for allowing me to participate in the care of [facility].   If you have any questions or concerns, please do not hesitate to contact us.    Vassie Loll, DO  [facility]   [address]  [place], [place] [address]  Phone [phone]        Total time spent on this encounter 41 minutes.    This includes the time spent on the day of service reviewing pathology and/or imaging reports and/or laboratory studies as well as time spent discussing the care with other physicians and/or also face to face time with the patient. Greater than 50% of this time was spent counseling and/or coordinating care.      --------------  This note was generated by the Epic EMR system/ Dragon speech recognition and may contain inherent errors or omissions not intended by the user. Grammatical errors, random word insertions, deletions and pronoun errors are occasional consequences of this technology due to software limitations. Not all errors are caught or corrected. If there are questions or concerns about the content of this note or information contained within the body of this dictation they should be addressed directly with the author for clarification.

## 2022-10-19 NOTE — Patient Instructions (Addendum)
Endoscopy and colonoscopy 6 months after DAPT  Cardiac clearance hospital setting    Endoscopy with possible biopsy possible dilation    Diagnostic Colonoscopy +/- biopsy +/- polypectomy   -low fiber diet beginning 3 days prior (avoid raw fruits vegetables nuts seeds popcorn etc)  -avoid NSAIDs (ibuprofen, advil, etc) beginning 5 days prior   -stop fiber supplements and iron supplements 1 week before procedure   -begin miralax 1 dose/packet/cap 1 week before procedure, take it daily and take until 1 day before beginning bowel preparation                 [email or web address]://patient.[email or web address]  DomainThemes.gl              Safety of upper GI endoscopy:  Doctors have been doing upper GI endoscopy tests for many years and have shown that it is a safe test. Issues from the test almost never happen. If issues do happen, they might be:  -A hole in the intestinal wall (this might need to be fixed through surgery).  -Bleeding.  -A bad reaction to the medicine you are given during the test to help you relax.               Screening for colorectal cancer can identify premalignant/precancerous lesions and detect asymptomatic early-stage malignancy/cancer. Screening has been shown to decrease mortality from colorectal cancer. Guidelines have recommended that screening should start at age 87 for average risk individuals     Screening options include fiberoptic colonoscopy, CT colonoscopy (every 5 years), flexible sigmoidoscopy (every 5 years) or stool testing (iFOBT or FIT annually on one sample, annual FOBT on 3 samples or multitarget stool DNA testing called Cologuard on one sample every 3 years).                           Colonoscopy is considered the optimal screening test in the majority of individuals. It visualizes the colon with a lighted camera that visualizes lesions as it passes through the intestinal tract which allows for biopsy and  lesion removal at the time of the test.    https://patient.[email or web address]  https://wilkins.com/    Although colonoscopy is a safe test, complications can happen sometimes. Problems during a colonoscopy are rare.  Some examples may be:  -Tearing or puncturing of the colon walls, which could call for emergency surgery to fix.  -When a polyp is taken out or a biopsy (taking a small piece of tissue to look at under a microscope) is done, it could result in heavy bleeding. Sometimes a blood transfusion is needed. -Sometimes the colonoscope needs to be put back in to control the bleeding.  -You should know that colonoscopy is not perfect and even with a skilled doctor, some colon lesions (abnormalities) might be missed.    Risks/benefits/alternatives were discussed including but not limiting:bleeding, infection, perforation, injury to spleen/liver, missed lesions, and anesthesia complications (such as heart/lung issues).    Recommendations:  -Colonoscopy +/- biopsy +/- polypectomy   -low fiber diet beginning 3 days prior   -avoid NSAIDs beginning 5 days prior   -if baseline constipation, begin miralax 1 dose/packet/cap daily and take until 1 day before beginning bowel preparation

## 2022-10-20 ENCOUNTER — Telehealth: Payer: Self-pay

## 2022-10-20 ENCOUNTER — Inpatient Hospital Stay
Admission: RE | Admit: 2022-10-20 | Discharge: 2022-10-20 | Disposition: A | Discharge status: Home / Self Care | Payer: No Typology Code available for payment source | Source: Ambulatory Visit | Attending: Student in an Organized Health Care Education/Training Program | Admitting: Student in an Organized Health Care Education/Training Program

## 2022-10-20 VITALS — Wt 175.3 lb

## 2022-10-20 DIAGNOSIS — Z5189 Encounter for other specified aftercare: Secondary | ICD-10-CM | POA: Insufficient documentation

## 2022-10-20 DIAGNOSIS — I252 Old myocardial infarction: Secondary | ICD-10-CM | POA: Insufficient documentation

## 2022-10-20 DIAGNOSIS — I2121 ST elevation (STEMI) myocardial infarction involving left circumflex coronary artery: Secondary | ICD-10-CM

## 2022-10-20 HISTORY — DX: Atherosclerotic heart disease of native coronary artery without angina pectoris: I25.10

## 2022-10-20 HISTORY — DX: Hyperlipidemia, unspecified: E78.5

## 2022-10-20 HISTORY — DX: Unspecified visual disturbance: H53.9

## 2022-10-20 HISTORY — DX: Unspecified hearing loss, unspecified ear: H91.90

## 2022-10-20 HISTORY — DX: Acute myocardial infarction, unspecified: I21.9

## 2022-10-20 LAB — CBC AND DIFFERENTIAL
Baso(Absolute): 0.1 10*3/uL (ref 0.0–0.2)
Basophils Automated: 1 %
Eosinophils Absolute: 1.7 10*3/uL — ABNORMAL HIGH (ref 0.0–0.4)
Eosinophils Automated: 18 %
Hematocrit: 41.5 % (ref 37.5–51.0)
Hemoglobin: 13.7 g/dL (ref 13.0–17.7)
Immature Granulocytes Absolute: 0 10*3/uL (ref 0.0–0.1)
Immature Granulocytes: 0 %
Lymphocytes Absolute: 2.3 10*3/uL (ref 0.7–3.1)
Lymphocytes Automated: 24 %
MCH: 28.5 pg (ref 26.6–33.0)
MCHC: 33 g/dL (ref 31.5–35.7)
MCV: 86 fL (ref 79–97)
Monocytes Absolute: 0.5 10*3/uL (ref 0.1–0.9)
Monocytes: 5 %
Neutrophils Absolute Count: 4.9 10*3/uL (ref 1.4–7.0)
Neutrophils: 52 %
Platelets: 240 10*3/uL (ref 150–450)
RBC: 4.81 x10E6/uL (ref 4.14–5.80)
RDW: 12.4 % (ref 11.6–15.4)
WBC: 9.4 10*3/uL (ref 3.4–10.8)

## 2022-10-20 LAB — FERRITIN: Ferritin: 317 ng/mL (ref 30–400)

## 2022-10-20 LAB — FOLATE

## 2022-10-20 LAB — IRON PROFILE
Iron Saturation: 27 % (ref 15–55)
Iron: 68 ug/dL (ref 38–169)
TIBC: 256 ug/dL (ref 250–450)
UIBC: 188 ug/dL (ref 111–343)

## 2022-10-20 LAB — VITAMIN B12

## 2022-10-20 LAB — C-REACTIVE PROTEIN: C-Reactive Protein: 2 mg/L (ref 0–10)

## 2022-10-20 LAB — INTRINSIC FACTOR BLOCKING ANTIBODY

## 2022-10-20 LAB — SOLUBLE TRANSFERRIN RECEPTOR: Soluble Transferrin Receptor: 15.5 nmol/L (ref 12.2–27.3)

## 2022-10-20 LAB — ANTI-PARIETAL CELL ANTIBODY

## 2022-10-20 NOTE — Telephone Encounter (Signed)
Pt's son returned call.  Confirmed pt's 1614 appointment for CR today.

## 2022-10-20 NOTE — Progress Notes (Signed)
[facility] presented today for initial assessment.  Patient care completed was >61 minutes.  The following was performed during the initial assessment:    Evaluation of ECG using [facility] telemetry monitoring? yes  Physical assessment  Exercise assessment, planning, and education  Risk factor assessment, planning, and education  ITP review and update  Outcomes data collected and reviewed      Physical Assessment     BP: 96/60. Right Arm; 100/60, Left Arm2    SPO2: 95    HR: 65     Have you had any lightheadedness or dizziness?No    Apical Pulse:  regular    Do you have any sores, numbness or tingling in your lower extremities? n/a    Heart Sounds: S1 and S2     Peripheral Edema: none    Diabetic Foot Check:  Do you have any redness, cuts or sores on your feet? n/a    Lung Sounds: clear right upper, middle, and lower and clear left upper, middle, and lower     Have you had any increase or changes in shortness of breath? No    Muscle Skeletal: strong    Incisions: none    Have you had any new / other pain? No    Functional Mobility:  Timed Up and Go: n/a    Comments: Wife and daughter present; daughter interpreted per pt's request; waiver signed and in chart.

## 2022-10-20 NOTE — Telephone Encounter (Signed)
Attempted to call pt on his mobile line; no answer; no vm available.  Contacted pt's son at alternate number listed to confirm pt's initial intake interview at [facility] today at 4:15 .  Pt to contact his father and call back CR w/ confirmation.

## 2022-10-20 NOTE — Initial Cardiac Rehab ITP (Signed)
Cardiac Rehab Individual Treatment Plan    Referring Provider: Lenox Ahr, MD   Comments: Per H&P in Epic, pt has a PMH significant for CAD, diabetes and hyperlipidemia. Pt presented to the hospital on 09/01/2022 with a STEMI and was taken to the Cath Lab where he had a PCI with 3 DES to his Lt circumflex.  Pt returned to the hospital w/ c/o chest pain for which STEMI was activated.  Patient was taken to cardiac catheterization lab on 09/29/2021.  Cardiac catheterization noted mild CAD in the LAD and RCA.  Prior stents were noted to be patent and no intervention was performed.  Cardiology was consulted and recommended continuation of medical management with DAPT, high intensity statin and metoprolol. They cleared for discharge and recommended cardiac rehab outpatient with cardiology follow up.  Pt had cardiology f/u on 10/07/2022 w/ notation of patient's wife stating pt had only been taking Brilinta once daily and CM re-educating pt to take twice a day.  Pt continued to c/o right arm pain for which he was evaluated at cardiology f/u; pt reported his right arm  improved with use of the massage gun at home. Case Manager note in [place] dated 1/30/204 notes pt's spouse infomed CM of difficulty with obtaining patient's medication (metoprolol).  Plan to f/u w/ pt at his interview later today.    Assessment Period  Phase: Initial Assessment  Program :  (Initial Assessment Scheduled for later today.)  Session #: Scheduled for first 2 sessions later today.  First Exercise Session (Date): 10/20/22 (Scheduled for later today.)  Referring Diagnosis: STEMI  Date of Event: 09/01/22  Additional Cardiac History: PCI  AACVPR Risk Stratification: Moderate risk  Ejection Fraction: 49%  Ejection Fraction (Test): Echo  Ejection Fraction Study Date: 09/30/2022  Comment: Per H&P in Epic, pt has a PMH significant for CAD, diabetes and hyperlipidemia. Pt presented to the hospital on 09/01/2022 with a STEMI and was taken to the Cath Lab where he  had a PCI with 3 DES to his Lt circumflex.  Pt returned to the hospital w/ c/o chest pain for which STEMI was activated.  Patient was taken to cardiac catheterization lab on 09/29/2021.  Cardiac catheterization noted mild CAD in the LAD and RCA.  Prior stents were noted to be patent and no intervention was performed.  Cardiology was consulted and recommended continuation of medical management with DAPT, high intensity statin and metoprolol. They cleared for discharge and recommended cardiac rehab outpatient with cardiology follow up.  Pt had cardiology f/u on 10/07/2022 w/ notation of patient's wife stating pt had only been taking Brilinta once daily and CM re-educating pt to take twice a day.  Pt continued to c/o right arm pain for which he was evaluated at cardiology f/u; pt reported his right arm  improved with use of the massage gun at home. Case Manager note in [place] dated 1/30/204 notes pt's spouse infomed CM of difficulty with obtaining patient's medication (metoprolol).  Plan to f/u w/ pt at his interview later today.    Handout Provided:  (Cardiac Education [facility] reviewed and provided:)     Exercise  Assessment:  (Pending assessment at interview later today.)  Comment: H&P notes pt being active priro to recent STEMI/Stents.  Plan to assess at interview later today.     Exercise Prescription/Plan  40% Heart Rate Reserve: 97  80% Heart Rate Reserve: 134  Frequency: Cardiac Rehab 3 days/week  RPE Range: 11-14  METS: 2 to 4 or as  tolerated  Time: 20-29 minutes  Mode: Treadmill, Bike, Nu Step, Walking (pending intial assessment)  Strength Training: Begin when cleared by CR clinician  Education/Intervention: Provide written information on exercise topics, Educate on the cardiovascular benefits of exercise, Develop a home exercise plan, Educate on strength training guidelines, Educate on RPE, THR, warm up/cool down, Self monitoring using pulse taking, heart rate monitor, activity tracker, Progress time and  intensity when a steady state of HR and RPE occur, Educate on signs/symptoms of cardiovascular compromise  Goals: Increase in METS by at least 40%, Cardiovascular exercise 150-300 minutes per week, Strength exercise 2 -3 days per week, Avoid inactivity  Goal(s) Status: Not applicable on initial assessment  Comment: Pending today's intial assesment and then w/ gradual progression as tolerated w/in THR and RPE ranges.     Nutrition  Assessment: Pending  Height: 170.2 cm (5\' 7" ) (per most recent cardiology OVN)  Weight (lbs): 166 (per most recent cardiology OVN)  BMI (calculated): 26  BMI Classification: Overweight BMI 25-29.9  Initial Nutrition Assessment Survey Score :  (pending today's interview)     Nutrition Plan        Psychosocial  Assessment: Pending assessment  Initial PHQ-9 Score:  (pending; to be completed at initial assessment)  Initial COOP Quality of Life Survey Score: pending; to be completed at initial assessment  Education/Intervention: Repeat psychosocial survey, Provide written information on stress / lifestyle management, Refer back to community provider if appropriate, Teach and support self-help strategies, Note to MD if survey indicates psychosocial needs, Regular physical activity/exercise, Assist patient with identifying stressors, Educate on positive coping mechanisms  Goals: Adequate support system, Positive coping mechanisms, Improvement in depression,anxiety, social support or health related quality of life, Improvement in PHQ9 survey score, Medication compliance (pending initial assessment)  Goal(s) Status: Not applicable on initial assessment  Comment: Pending initial assessment scheduled for later today.     Diabetes  A1C Collection Date: 09/12/22  A1C (%): 5.2  Education/Intervention: Provide written information on diabetes management, Recommend RDN consult for MNT  Comment: Diabetes listed in H&P; no medications listed in Epic.  Pending assessment at today's interview.     Tobacco  Cessation:        Blood Pressure        Lipid Management  Assessment: HLD  Lipid Profile Collection Date: 09/12/22  Total Cholesterol (mg/dL): 137  HDL (mg/dL): 38  LDL (mg/dL) : 88  Triglycerides (mg/dL): 55  VLDL (mg/dL): 11  Education/Intervention: Provide written information on lipid management, Medication compliance, Recommend RDN consult for MNT, Weight management, Reinforce RDN recommendations for heart healthy nutrition and lipid management  Goals: Total Chol <200, LDL (CAD) <70, LDL (non CAD) <100, Women HDL >50 mg/dl - Men HDL >40 mg/dl, Triglycerides <150  Goal(s) Status: Goal partially met  Comment: Pt Rxd Lipitor 80 mg daily; will assess adherence at today's interview.     Heart Failure:  Assessment: No Heart Failure    Patient Stated Goals  What Matters Most?: Pending today's assessment. (10/20/2022  2:00 PM)      Patient Stated Goal 1: Pending today's assessment.  Patient Stated Goal 2: Pending today's assessment.

## 2022-10-21 LAB — FOLATE: Folate: 18.1 ng/mL (ref >3.0)

## 2022-10-21 LAB — VITAMIN B12: Vitamin B-12: 975 pg/mL (ref 232–1245)

## 2022-10-21 LAB — ANTI-PARIETAL CELL ANTIBODY: Parietal Cell AB: 6.4 Units (ref 0.0–20.0)

## 2022-10-21 LAB — INTRINSIC FACTOR BLOCKING ANTIBODY: Intrinsic Factor Blocking Antibody: 1.3 AU/mL — ABNORMAL HIGH (ref 0.0–1.1)

## 2022-10-21 NOTE — Addendum Note (Signed)
Addended by: Fara Boros on: 10/21/2022 03:17 PM     Modules accepted: Orders

## 2022-10-21 NOTE — Progress Notes (Addendum)
Cardiac Rehab Individual Treatment Plan    Referring Provider: Lenox Ahr, MD   Comments: Per H&P in Epic, pt has a PMH significant for CAD, diabetes and hyperlipidemia. Pt presented to the hospital on 09/01/2022 with a STEMI and was taken to the Cath Lab where he had a PCI with 3 DES to his Lt circumflex.  Pt returned to the hospital w/ c/o chest pain for which STEMI was activated.  Patient was taken to cardiac catheterization lab on 09/29/2021.  Cardiac catheterization noted mild CAD in the LAD and RCA.  Prior stents were noted to be patent and no intervention was performed.  Cardiology was consulted and recommended continuation of medical management with DAPT, high intensity statin and metoprolol. They cleared for discharge and recommended cardiac rehab outpatient with cardiology follow up.  Pt had cardiology f/u on 10/07/2022 w/ notation of patient's wife stating pt had only been taking Brilinta once daily and CM re-educating pt to take twice a day.  Pt continued to c/o right arm pain for which he was evaluated at cardiology f/u; pt reported his right arm  improved with use of the massage gun at home. Case Manager note in [place] dated 1/30/204 notes pt's spouse infomed CM of difficulty with obtaining patient's medication (metoprolol).  Pt now reports he is taking his Metoprolol.    Assessment Period  Phase: Initial Assessment  Program : Currently enrolled in Phase 2  Session #: 1&2  First Exercise Session (Date): 10/20/22  Referring Diagnosis: STEMI  Date of Event: 09/01/22  Additional Cardiac History: PCI  AACVPR Risk Stratification: Moderate risk  Ejection Fraction: 49%  Ejection Fraction (Test): Echo  Ejection Fraction Study Date: 09/30/2022  Comment: Per H&P in Epic, pt has a PMH significant for CAD, diabetes and hyperlipidemia. Pt presented to the hospital on 09/01/2022 with a STEMI and was taken to the Cath Lab where he had a PCI with 3 DES to his Lt circumflex.  Pt returned to the hospital w/ c/o chest pain  for which STEMI was activated.  Patient was taken to cardiac catheterization lab on 09/29/2021.  Cardiac catheterization noted mild CAD in the LAD and RCA.  Prior stents were noted to be patent and no intervention was performed.  Cardiology was consulted and recommended continuation of medical management with DAPT, high intensity statin and metoprolol. They cleared for discharge and recommended cardiac rehab outpatient with cardiology follow up.  Pt had cardiology f/u on 10/07/2022 w/ notation of patient's wife stating pt had only been taking Brilinta once daily and CM re-educating pt to take twice a day.  Pt continued to c/o right arm pain for which he was evaluated at cardiology f/u; pt reported his right arm  improved with use of the massage gun at home. Case Manager note in [place] dated 1/30/204 notes pt's spouse infomed CM of difficulty with obtaining patient's medication (metoprolol).  Pt now reports he is taking his Metoprolol.    Handout Provided:  (Cardiac Education [facility] reviewed and provided: SMART Goals;)     Exercise  Assessment: Exercise history 2 weeks prior to cardiac event pre cardiac event, Exercise history 2 weeks pre cardiac rehab/post cardiac event  Work Related Physical Requirements: no/retired  Physical Limitations: No  Current Exercise: Yes (recent PT for walking and stairs; up and down 6-7x; walking 30 to 40 mins every day)  Where: Walk (light walk 7 days per week x 30 to 40 minutes)  On average, how many days per week do you  engage in moderate to strenuous exercise (like a brisk walk)?: 0 days  Strength Training: Yes (w/ PT light weights)  Interval Training: No  Current Peak MET Level: 2.3 (TM)  Adherence: Adheres to current cardiac rehab exercise prescription  Comment: Pt reports long-standing history of exercise, including running outside, gymnastic type exercises and using the gym.  Was running for an hour at at time for 10 to 15 years, 3 to 4 x week. (outside running and gym  activity; 1 hour running + 10 to 15 years; 3 to 4 x week)     Exercise Prescription/Plan  40% Heart Rate Reserve: 98  80% Heart Rate Reserve: 134  Frequency: Cardiac Rehab 3 days/week  RPE Range: 11-14  METS: 2 to 4 or as tolerated  Time: 20-29 minutes  Mode: Treadmill, Bike, Nu Step, Walking  Strength Training: Begin when cleared by CR clinician  Education/Intervention: Provide written information on exercise topics, Educate on the cardiovascular benefits of exercise, Develop a home exercise plan, Educate on strength training guidelines, Educate on RPE, THR, warm up/cool down, Self monitoring using pulse taking, heart rate monitor, activity tracker, Progress time and intensity when a steady state of HR and RPE occur, Educate on signs/symptoms of cardiovascular compromise  Goals: Increase in METS by at least 40%, Cardiovascular exercise 150-300 minutes per week, Strength exercise 2 -3 days per week, Avoid inactivity  Goal(s) Status: Not applicable on initial assessment  Comment: Ex Rx/Plan as noted above w/ gradual progression as tolerted staying w/in recommended THR and RPE ranges.  Introduce weights and advance per protocol.     Nutrition  Assessment: Pending  Height: 170.2 cm (5\' 7" ) (per most recent cardiology OVN)  Weight (lbs): 170.1lb  BMI (calculated): 27.5  BMI Classification: Overweight BMI 25-29.9  Initial Nutrition Assessment Survey Score : 13  Initial Nutrition Assessment Survey Score Indicates: 7-13: Demonstrates some nutrition strategies to support reduction in CV risk, meet with RDN for MNT     Nutrition Plan  Current Eating Plan: Other (no specific diet; eats vegetables, eggs , eats all home cooked meals, fruits, beans, rice, and nuts)     Psychosocial  Assessment: Pending assessment, Positive coping mechanisms, Adequate support system  In a typical week, how many times do you talk on the phone with family, friends, or neighbors?: More than three times a week  How often do you get together with  friends or relatives?: More than three times a week  How often do you attend church or religious services?: Never  Do you belong to any clubs or organizations such as church groups, unions, fraternal or athletic groups, or school groups?: No  How often do you attend meetings of the clubs or organizations you belong to?: Never  Are you married, widowed, divorced, separated, never married, or living with a partner?: Married  Barriers to Learning: Language  Barriers to Attendance: None  Occupation: Chief Financial Officer  Work Status: Retired  Initial PHQ-9 Score: 0  Repeat PHQ9 per clinician?: No  Level of Depression Severity PHQ-9: None-Minimal 0-4  Initial COOP Quality of Life Survey Score: 15  Education/Intervention: Provide written information on stress / lifestyle management, Teach and support self-help strategies, Regular physical activity/exercise, Assist patient with identifying stressors, Educate on positive coping mechanisms, Repeat psychosocial survey  Goals: Adequate support system, Positive coping mechanisms, Improvement in depression,anxiety, social support or health related quality of life, Improvement in PHQ9 survey score  Goal(s) Status: Goal partially met  Comment: Current stressor is r/t finances.  Pt  lives w/ spouse and family members.  Recently moved from [place] 3 years ago.  Notes close family support.  Likes to read and walk to relax.     Diabetes  Assessment: No diabetes (no hx of diabetes per pt and family; H&P noted hx of diabetes)  A1C Collection Date: 09/12/22  A1C (%): 5.2  Education/Intervention: Provide written information on diabetes management, Recommend RDN consult for MNT  Comment: Diabetes hx noted in H&P.  Pt and family deny any hx of diabetes.  Pt does not take any medications; A!C 5.2     Tobacco Cessation:        Blood Pressure        Lipid Management  Assessment: HLD  Lipid Profile Collection Date: 09/12/22  Total Cholesterol (mg/dL): 137  HDL (mg/dL): 38  LDL (mg/dL) :  88  Triglycerides (mg/dL): 55  VLDL (mg/dL): 11  Education/Intervention: Provide written information on lipid management, Medication compliance, Recommend RDN consult for MNT, Weight management, Reinforce RDN recommendations for heart healthy nutrition and lipid management  Goals: Total Chol <200, LDL (CAD) <70, LDL (non CAD) <100, Women HDL >50 mg/dl - Men HDL >40 mg/dl, Triglycerides <150  Goal(s) Status: Goal partially met  Comment: Cholesterol profile reviewed w/ pt and family.  Pt states adherence w/ Lipitor 80 mg daily.  Plans to meet w/ RD.     Heart Failure:  Assessment: No Heart Failure    SET-PAD:       Patient Stated Goals  What Matters Most?: finances (10/20/2022  5:00 PM)      Patient Stated Goal 1: "to take care of myself and be a healthy person"  Goal 1 Status: Goal partially met  Comment: Pt walking daily, taking meds as prescribed, started CR today.  Patient Stated Goal 2: "return to exercise"   Goal 2 Status: Goal partially met  Comment: Pt has been walking daily.

## 2022-10-21 NOTE — Addendum Note (Signed)
Encounter addended by: Hughes Better, RN on: 10/21/2022 8:47 AM   Actions taken: Flowsheet accepted, Clinical Note Signed, Order Reconciliation Section accessed

## 2022-10-26 ENCOUNTER — Encounter (INDEPENDENT_AMBULATORY_CARE_PROVIDER_SITE_OTHER): Payer: Self-pay | Admitting: Family Medicine

## 2022-10-28 ENCOUNTER — Inpatient Hospital Stay
Admission: RE | Admit: 2022-10-28 | Discharge: 2022-10-28 | Disposition: A | Discharge status: Home / Self Care | Payer: No Typology Code available for payment source | Source: Ambulatory Visit

## 2022-10-28 VITALS — Wt 168.8 lb

## 2022-10-28 DIAGNOSIS — I214 Non-ST elevation (NSTEMI) myocardial infarction: Secondary | ICD-10-CM

## 2022-11-01 ENCOUNTER — Inpatient Hospital Stay
Admission: RE | Admit: 2022-11-01 | Discharge: 2022-11-01 | Disposition: A | Discharge status: Home / Self Care | Payer: No Typology Code available for payment source | Source: Ambulatory Visit

## 2022-11-01 VITALS — Wt 169.5 lb

## 2022-11-01 DIAGNOSIS — I213 ST elevation (STEMI) myocardial infarction of unspecified site: Secondary | ICD-10-CM

## 2022-11-03 ENCOUNTER — Inpatient Hospital Stay
Admission: RE | Admit: 2022-11-03 | Discharge: 2022-11-03 | Disposition: A | Discharge status: Home / Self Care | Payer: No Typology Code available for payment source | Source: Ambulatory Visit

## 2022-11-03 VITALS — Wt 167.4 lb

## 2022-11-03 DIAGNOSIS — I213 ST elevation (STEMI) myocardial infarction of unspecified site: Secondary | ICD-10-CM

## 2022-11-04 ENCOUNTER — Inpatient Hospital Stay
Admission: RE | Admit: 2022-11-04 | Discharge: 2022-11-04 | Disposition: A | Discharge status: Home / Self Care | Payer: No Typology Code available for payment source | Source: Ambulatory Visit

## 2022-11-04 VITALS — Wt 168.4 lb

## 2022-11-04 DIAGNOSIS — I213 ST elevation (STEMI) myocardial infarction of unspecified site: Secondary | ICD-10-CM

## 2022-11-08 ENCOUNTER — Inpatient Hospital Stay
Admission: RE | Admit: 2022-11-08 | Discharge: 2022-11-08 | Disposition: A | Discharge status: Home / Self Care | Payer: No Typology Code available for payment source | Source: Ambulatory Visit

## 2022-11-08 VITALS — Wt 166.1 lb

## 2022-11-08 DIAGNOSIS — I213 ST elevation (STEMI) myocardial infarction of unspecified site: Secondary | ICD-10-CM

## 2022-11-08 NOTE — Progress Notes (Signed)
Pt noted to have prolonged bigeminy while on the NuStep today, ~ 8 minutes. Dr. Tamala Julian and Dr. Rich Number of [facility] notified via secure chat. Pt remained asymptomatic. Pt stated that he took all medications prior to his appt today.

## 2022-11-09 ENCOUNTER — Encounter (INDEPENDENT_AMBULATORY_CARE_PROVIDER_SITE_OTHER): Payer: Self-pay | Admitting: Cardiology

## 2022-11-09 ENCOUNTER — Encounter (INDEPENDENT_AMBULATORY_CARE_PROVIDER_SITE_OTHER): Payer: Self-pay | Admitting: Family Medicine

## 2022-11-09 ENCOUNTER — Ambulatory Visit (INDEPENDENT_AMBULATORY_CARE_PROVIDER_SITE_OTHER): Payer: No Typology Code available for payment source | Admitting: Cardiology

## 2022-11-09 VITALS — BP 104/73 | HR 56 | Temp 98.2°F | Ht 66.93 in | Wt 169.0 lb

## 2022-11-09 DIAGNOSIS — I2510 Atherosclerotic heart disease of native coronary artery without angina pectoris: Secondary | ICD-10-CM

## 2022-11-09 DIAGNOSIS — R072 Precordial pain: Secondary | ICD-10-CM

## 2022-11-09 DIAGNOSIS — E559 Vitamin D deficiency, unspecified: Secondary | ICD-10-CM

## 2022-11-09 LAB — ECG 12-LEAD
Atrial Rate: 55 {beats}/min
P Axis: 69 degrees
P-R Interval: 190 ms
Q-T Interval: 416 ms
QRS Duration: 84 ms
QTC Calculation (Bezet): 397 ms
R Axis: -9 degrees
T Axis: -36 degrees
Ventricular Rate: 55 {beats}/min

## 2022-11-09 MED ORDER — ATORVASTATIN CALCIUM 80 MG PO TABS
80.0000 mg | ORAL_TABLET | Freq: Every evening | ORAL | 3 refills | Status: DC
Start: 2022-11-09 — End: 2022-11-12

## 2022-11-09 MED ORDER — METOPROLOL SUCCINATE ER 25 MG PO TB24
25.0000 mg | ORAL_TABLET | Freq: Every day | ORAL | 3 refills | Status: DC
Start: 2022-11-09 — End: 2022-12-24

## 2022-11-09 MED ORDER — TICAGRELOR 90 MG PO TABS
90.0000 mg | ORAL_TABLET | Freq: Two times a day (BID) | ORAL | 3 refills | Status: DC
Start: 2022-11-09 — End: 2023-02-08

## 2022-11-09 MED ORDER — ASPIRIN 81 MG PO TBEC
81.0000 mg | DELAYED_RELEASE_TABLET | Freq: Every day | ORAL | 3 refills | Status: DC
Start: 2022-11-09 — End: 2022-12-09

## 2022-11-09 NOTE — Progress Notes (Signed)
[facility] 67 y.o. with a history of coronary artery disease presents for cardiac follow-up of coronary artery disease.    Coronary artery disease: Inferior ST segment elevation myocardial infarction (09/11/2022) status post drug-eluting stent placement x 3 to the left circumflex artery 3.5 x 18, 3.5 x 18, 3 x 18 onyx Frontier).  He did undergo repeat cardiac cath (09/29/2022) for recurrent chest pain that noted patent left circumflex stent.  Currently he does have atypical chest pain.  He is compliant with aspirin 81 mg daily, Brilinta 90 mg twice a day, Toprol-XL 25 mg daily and atorvastatin 80 mg nightly.  He is currently in cardiac rehab.  There was an episode of ventricular bigeminy noted with activity in cardiac rehab.    He denies shortness of breath, lower extreme edema, orthopnea or exertional chest pain.    Most recent cardiac workup is included    Echocardiogram (09/30/2022): Ejection fraction 49%.  Mild mitral regurgitation, tricuspid regurgitation and aortic regurgitation    Cardiac cath (09/11/2022)  LM       Angiographically normal  LAD     40% mid stenosis  LCx      20% proximal stenosis              100% mid stenosis with distal TIMI 0 flow  RCA     20% mid stenosis  LV        LVEF 50%; mid inferior wall hypokinesis; LVEDP 15 mmHg; no significant gradient across aortic valve              Current Outpatient Medications   Medication Sig Dispense Refill    ferrous sulfate 324 (65 FE) MG Tablet Delayed Response Take 1 tablet (324 mg) by mouth every morning with breakfast 60 tablet 0    pantoprazole (PROTONIX) 40 MG tablet Take 1 tablet (40 mg) by mouth every morning before breakfast 90 tablet 0    vitamin D, ergocalciferol, (DRISDOL) 50000 UNIT Cap Take 1 capsule (50,000 Units) by mouth once a week for the next 8 weeks. 8 capsule 0    aspirin EC 81 MG EC tablet Take 1 tablet (81 mg) by mouth daily 90 tablet 3    atorvastatin (LIPITOR) 80 MG tablet Take 1 tablet (80 mg) by mouth nightly 90 tablet 3     metoprolol succinate XL (TOPROL-XL) 25 MG 24 hr tablet Take 1 tablet (25 mg) by mouth daily 90 tablet 3    ticagrelor (BRILINTA) 90 MG Tab Take 1 tablet (90 mg) by mouth every 12 (twelve) hours 180 tablet 3     No current facility-administered medications for this visit.          PE:    Vitals:    11/09/22 1412   BP: 104/73   Pulse: (!) 56   Temp: 98.2 F (36.8 C)   SpO2: 97%     Body mass index is 26.53 kg/m.    Physical Examination: General appearance - alert, well appearing, and in no distress  Mental status - normal mood, behavior, speech, dress, motor activity, and thought processes  Eyes - sclera anicteric  Chest - clear to auscultation, no wheezes, rales or rhonchi, symmetric air entry  Heart - normal rate and regular rhythm, S1 and S2 normal  Abdomen - not examined  Extremities - no pedal edema noted      EKG:    Sinus bradycardia, inferior infarct    Labs:  Lipid Panel   Cholesterol   Date/Time Value Ref  Range Status   09/12/2022 03:42 AM 137 0 - 199 mg/dL Final   08/16/2022 10:30 AM 189 100 - 199 mg/dL Final     Triglycerides   Date/Time Value Ref Range Status   09/12/2022 03:42 AM 55 34 - 149 mg/dL Final     HDL   Date/Time Value Ref Range Status   09/12/2022 03:42 AM 38 (L) 40 - 9,999 mg/dL Final     Comment:     An HDL cholesterol <40 mg/dL is low and constitutes a  coronary heart disease risk factor, and HDL-C>59 mg/dL is  a negative risk factor for CHD.  Ref: American Heart Association; Circulation 2004     08/16/2022 10:30 AM 44 >39 mg/dL Final       CMP:   Sodium   Date/Time Value Ref Range Status   09/30/2022 04:17 AM 140 135 - 145 mEq/L Final     Potassium   Date/Time Value Ref Range Status   09/30/2022 04:17 AM 4.2 3.5 - 5.3 mEq/L Final     Chloride   Date/Time Value Ref Range Status   09/30/2022 04:17 AM 109 99 - 111 mEq/L Final     CO2   Date/Time Value Ref Range Status   09/30/2022 04:17 AM 25 17 - 29 mEq/L Final     Glucose   Date/Time Value Ref Range Status   09/30/2022 04:17 AM 90 70 -  100 mg/dL Final     Comment:     ADA guidelines for diabetes mellitus:  Fasting:  Equal to or greater than 126 mg/dL  Random:   Equal to or greater than 200 mg/dL       BUN   Date/Time Value Ref Range Status   09/30/2022 04:17 AM 12.0 9.0 - 28.0 mg/dL Final     Protein, Total   Date/Time Value Ref Range Status   09/30/2022 04:17 AM 5.9 (L) 6.0 - 8.3 g/dL Final     Alkaline Phosphatase   Date/Time Value Ref Range Status   09/30/2022 04:17 AM 60 37 - 117 U/L Final     AST (SGOT)   Date/Time Value Ref Range Status   09/30/2022 04:17 AM 15 5 - 41 U/L Final     ALT   Date/Time Value Ref Range Status   09/30/2022 04:17 AM 18 0 - 55 U/L Final     Anion Gap   Date/Time Value Ref Range Status   09/30/2022 04:17 AM 6.0 5.0 - 15.0 Final     Comment:     Calculated AGAP = Na - (CL + CO2)  Interpret with caution; calculated AGAP may not  reflect patient's true clinical status.  This is a calculated value and platform-dependent.  A value >12.0 has been recommended for the management of  Hyperglycemic Crises: Diabetic Ketoacidosis and Hyperglycemic  Hyperosmolar State. Med Clin [place]. 2017;101(3):587-606.  doi:10.1016/j.mcna.2016.12.011         CBC:   WBC   Date/Time Value Ref Range Status   10/19/2022 02:34 PM CANCELED x10E3/uL      Comment:     Please refer to the following specimen for additional lab results.  ESE [phone]    Result canceled by the ancillary.       RBC   Date/Time Value Ref Range Status   10/19/2022 02:34 PM CANCELED       Comment:     Test not performed    Result canceled by the ancillary.  Hemoglobin   Date/Time Value Ref Range Status   10/19/2022 02:34 PM CANCELED       Comment:     Test not performed    Result canceled by the ancillary.       Hematocrit   Date/Time Value Ref Range Status   10/19/2022 02:34 PM CANCELED       Comment:     Test not performed    Result canceled by the ancillary.       MCV   Date/Time Value Ref Range Status   10/19/2022 02:34 AM 86 79 - 97 fL Final     MCHC    Date/Time Value Ref Range Status   10/19/2022 02:34 AM 33.0 31.5 - 35.7 g/dL Final     RDW   Date/Time Value Ref Range Status   10/19/2022 02:34 AM 12.4 11.6 - 15.4 % Final     Platelets   Date/Time Value Ref Range Status   10/19/2022 02:34 PM CANCELED       Comment:     Test not performed    Result canceled by the ancillary.             Impression / plan    Coronary artery disease: Status post drug-eluting stent placement to the left circumflex x 3.  Continue dual antiplatelet therapy in the form of aspirin 81 mg daily and Brilinta 90 mg every 12 hours.  Continue high-dose statin.  Will check fasting lipid profile/LFTs in 3 months.  Continue Toprol-XL 25 mg daily.  Continue cardiac rehab      Verlan Friends, MD  11/09/2022

## 2022-11-10 ENCOUNTER — Inpatient Hospital Stay
Admission: RE | Admit: 2022-11-10 | Discharge: 2022-11-10 | Disposition: A | Discharge status: Home / Self Care | Payer: No Typology Code available for payment source | Source: Ambulatory Visit

## 2022-11-10 VITALS — Wt 168.1 lb

## 2022-11-10 DIAGNOSIS — I213 ST elevation (STEMI) myocardial infarction of unspecified site: Secondary | ICD-10-CM

## 2022-11-11 ENCOUNTER — Telehealth (INDEPENDENT_AMBULATORY_CARE_PROVIDER_SITE_OTHER): Payer: Self-pay | Admitting: Family Medicine

## 2022-11-11 ENCOUNTER — Inpatient Hospital Stay
Admission: RE | Admit: 2022-11-11 | Discharge: 2022-11-11 | Disposition: A | Discharge status: Home / Self Care | Payer: No Typology Code available for payment source | Source: Ambulatory Visit

## 2022-11-11 ENCOUNTER — Encounter (INDEPENDENT_AMBULATORY_CARE_PROVIDER_SITE_OTHER): Payer: Self-pay | Admitting: Cardiology

## 2022-11-11 DIAGNOSIS — I213 ST elevation (STEMI) myocardial infarction of unspecified site: Secondary | ICD-10-CM

## 2022-11-12 ENCOUNTER — Other Ambulatory Visit (INDEPENDENT_AMBULATORY_CARE_PROVIDER_SITE_OTHER): Payer: Self-pay | Admitting: Family Medicine

## 2022-11-12 DIAGNOSIS — E559 Vitamin D deficiency, unspecified: Secondary | ICD-10-CM

## 2022-11-12 NOTE — Telephone Encounter (Signed)
LOV: 10/11/2022  No future Visit

## 2022-11-13 MED ORDER — ATORVASTATIN CALCIUM 80 MG PO TABS
80.0000 mg | ORAL_TABLET | Freq: Every evening | ORAL | 1 refills | Status: DC
Start: 2022-11-13 — End: 2023-02-11

## 2022-11-13 MED ORDER — VITAMIN D (ERGOCALCIFEROL) 1.25 MG (50000 UT) PO CAPS
50000.0000 [IU] | ORAL_CAPSULE | ORAL | 0 refills | Status: DC
Start: 2022-11-13 — End: 2022-11-17

## 2022-11-15 ENCOUNTER — Inpatient Hospital Stay
Admission: RE | Admit: 2022-11-15 | Discharge: 2022-11-15 | Disposition: A | Discharge status: Home / Self Care | Payer: No Typology Code available for payment source | Source: Ambulatory Visit | Attending: Student in an Organized Health Care Education/Training Program | Admitting: Student in an Organized Health Care Education/Training Program

## 2022-11-15 ENCOUNTER — Telehealth (INDEPENDENT_AMBULATORY_CARE_PROVIDER_SITE_OTHER): Payer: Self-pay | Admitting: Family Medicine

## 2022-11-15 ENCOUNTER — Other Ambulatory Visit (INDEPENDENT_AMBULATORY_CARE_PROVIDER_SITE_OTHER): Payer: Self-pay | Admitting: Family Medicine

## 2022-11-15 DIAGNOSIS — I213 ST elevation (STEMI) myocardial infarction of unspecified site: Secondary | ICD-10-CM

## 2022-11-15 DIAGNOSIS — I252 Old myocardial infarction: Secondary | ICD-10-CM | POA: Insufficient documentation

## 2022-11-15 DIAGNOSIS — Z5189 Encounter for other specified aftercare: Secondary | ICD-10-CM | POA: Insufficient documentation

## 2022-11-15 DIAGNOSIS — E559 Vitamin D deficiency, unspecified: Secondary | ICD-10-CM

## 2022-11-15 NOTE — Telephone Encounter (Signed)
Patient's son Doren, [place]  # [phone] called to request refill for RX ferroussulsate 324 67 FE tablet    RX is not on list of current medications or past medications.   Please reach at to patient's son.  Caller unable to verify address or insurance.

## 2022-11-16 ENCOUNTER — Encounter (INDEPENDENT_AMBULATORY_CARE_PROVIDER_SITE_OTHER): Payer: Self-pay | Admitting: Family Medicine

## 2022-11-16 DIAGNOSIS — E559 Vitamin D deficiency, unspecified: Secondary | ICD-10-CM

## 2022-11-17 ENCOUNTER — Other Ambulatory Visit (INDEPENDENT_AMBULATORY_CARE_PROVIDER_SITE_OTHER): Payer: Self-pay | Admitting: Family Medicine

## 2022-11-17 ENCOUNTER — Inpatient Hospital Stay
Admission: RE | Admit: 2022-11-17 | Discharge: 2022-11-17 | Disposition: A | Discharge status: Home / Self Care | Payer: No Typology Code available for payment source | Source: Ambulatory Visit

## 2022-11-17 VITALS — Wt 168.0 lb

## 2022-11-17 DIAGNOSIS — I213 ST elevation (STEMI) myocardial infarction of unspecified site: Secondary | ICD-10-CM

## 2022-11-17 DIAGNOSIS — E559 Vitamin D deficiency, unspecified: Secondary | ICD-10-CM

## 2022-11-17 MED ORDER — VITAMIN D (ERGOCALCIFEROL) 1.25 MG (50000 UT) PO CAPS
50000.0000 [IU] | ORAL_CAPSULE | ORAL | 0 refills | Status: DC
Start: 2022-11-17 — End: 2022-12-09

## 2022-11-17 NOTE — Telephone Encounter (Signed)
LOV:10/11/2022  NO Future Visit

## 2022-11-17 NOTE — Cardiac Rehab ITP (Signed)
Cardiac Rehab Individual Treatment Plan    Referring Provider: Otilio Miu, MD   Comments: Per H&P in Epic, pt has a PMH significant for CAD, diabetes and hyperlipidemia. Pt presented to the hospital on 09/01/2022 with a STEMI and was taken to the Cath Lab where he had a PCI with 3 DES to his Lt circumflex.  Pt returned to the hospital w/ c/o chest pain for which STEMI was activated.  Patient was taken to cardiac catheterization lab on 09/29/2021.  Cardiac catheterization noted mild CAD in the LAD and RCA.  Prior stents were noted to be patent and no intervention was performed.  Cardiology was consulted and recommended continuation of medical management with DAPT, high intensity statin and metoprolol. They cleared for discharge and recommended cardiac rehab outpatient with cardiology follow up.  Pt had cardiology f/u on 10/07/2022 w/ notation of patient's wife stating pt had only been taking Brilinta once daily and CM re-educating pt to take twice a day.  Pt continued to c/o right arm pain for which he was evaluated at cardiology f/u; pt reported his right arm  improved with use of the massage gun at home. Case Manager note in [place] dated 1/30/204 notes pt's spouse infomed CM of difficulty with obtaining patient's medication (metoprolol).  Pt now reports he is taking his Metoprolol.    Assessment Period  Phase: 107 Day Re-assessment (completed in person with interpreter on cyracom)  Program : Currently enrolled in Phase 2  Session #: 11  First Exercise Session (Date): 10/20/22  Referring Diagnosis: STEMI  Date of Event: 09/01/22  Additional Cardiac History: PCI  AACVPR Risk Stratification: Moderate risk  Ejection Fraction: 49%  Ejection Fraction (Test): Echo  Ejection Fraction Study Date: 09/30/2022  Comment: Per H&P in Epic, pt has a PMH significant for CAD, diabetes and hyperlipidemia. Pt presented to the hospital on 09/01/2022 with a STEMI and was taken to the Cath Lab where he had a PCI with 3 DES to his Lt  circumflex.  Pt returned to the hospital w/ c/o chest pain for which STEMI was activated.  Patient was taken to cardiac catheterization lab on 09/29/2021.  Cardiac catheterization noted mild CAD in the LAD and RCA.  Prior stents were noted to be patent and no intervention was performed.  Cardiology was consulted and recommended continuation of medical management with DAPT, high intensity statin and metoprolol. They cleared for discharge and recommended cardiac rehab outpatient with cardiology follow up.  Pt had cardiology f/u on 10/07/2022 w/ notation of patient's wife stating pt had only been taking Brilinta once daily and CM re-educating pt to take twice a day.  Pt continued to c/o right arm pain for which he was evaluated at cardiology f/u; pt reported his right arm  improved with use of the massage gun at home. Case Manager note in [place] dated 1/30/204 notes pt's spouse infomed CM of difficulty with obtaining patient's medication (metoprolol).  Pt now reports he is taking his Metoprolol.    Handout Provided: Blood Pressure (Cardiac Education [facility] reviewed and provided: SMART Goals;)     Exercise  Assessment: Exercise history 2 weeks prior to cardiac event pre cardiac event, Exercise history 2 weeks pre cardiac rehab/post cardiac event  Work Related Physical Requirements: no/retired  Physical Limitations: No  Current Exercise: Yes  Where: Walk (light walk 7 days per week x 30 to 40 minutes)  On average, how many days per week do you engage in moderate to strenuous exercise (like a  brisk walk)?: 7 days  On average, how many minutes do you engage in exercise at this level?: 40 min  Strength Training: Yes (w/ PT light weights)  Interval Training: No  Session #3 MET Level: 2.5  Current Peak MET Level: 4.3  Peak MET Goal: 3.5  MET Level Percentage Increase (%): 72  Adherence: Adheres to current cardiac rehab exercise prescription  Comment: Pt reports long-standing history of exercise, including running outside,  gymnastic type exercises and using the gym.  Was running for an hour at at time for 10 to 15 years, 3 to 4 x week. (outside running and gym activity; 1 hour running + 10 to 15 years; 3 to 4 x week)     Exercise Prescription/Plan  40% Heart Rate Reserve: 96  80% Heart Rate Reserve: 133  Frequency: Cardiac Rehab 3 days/week  RPE Range: 11-14  METS: 2 to 4 or as tolerated  Time: 30-39 minutes  Mode: Treadmill, Bike, Nu Step, Walking  Strength Training: Begin when cleared by CR clinician  Education/Intervention: Provide written information on exercise topics, Educate on the cardiovascular benefits of exercise, Develop a home exercise plan, Educate on strength training guidelines, Educate on RPE, THR, warm up/cool down, Self monitoring using pulse taking, heart rate monitor, activity tracker, Progress time and intensity when a steady state of HR and RPE occur, Educate on signs/symptoms of cardiovascular compromise  Goals: Increase in METS by at least 40%, Cardiovascular exercise 150-300 minutes per week, Strength exercise 2 -3 days per week, Avoid inactivity  Goal(s) Status: Goal partially met  Comment: Ex Rx/Plan as noted above w/ gradual progression as tolerted staying w/in recommended THR and RPE ranges.  Introduce weights and advance per protocol. Reviewed with interpreter.     Nutrition  Assessment: Pending  Height: 170.2 cm ('5\' 7"'$ ) (per most recent cardiology OVN)  Initial Weight (lbs): 170.1lb  Weight (lbs): 168lb  BMI (calculated): 26.3  BMI Classification: Overweight BMI 25-29.9  Initial Nutrition Assessment Survey Score : 13  Initial Nutrition Assessment Survey Score Indicates: 7-13: Demonstrates some nutrition strategies to support reduction in CV risk, meet with RDN for MNT     Nutrition Plan  Current Eating Plan: Other (no specific diet; eats vegetables, eggs , eats all home cooked meals, fruits, beans, rice, and nuts)  Consult Status: Consult not yet scheduled (pt would like to talk with Dietician and will  have daughter call and schedule)     Psychosocial  Assessment: Positive coping mechanisms, Adequate support system  In a typical week, how many times do you talk on the phone with family, friends, or neighbors?: More than three times a week  How often do you get together with friends or relatives?: More than three times a week  How often do you attend church or religious services?: Never  Do you belong to any clubs or organizations such as church groups, unions, fraternal or athletic groups, or school groups?: No  How often do you attend meetings of the clubs or organizations you belong to?: Never  Are you married, widowed, divorced, separated, never married, or living with a partner?: Married  Barriers to Learning: Language  Barriers to Attendance: None  Occupation: Chief Financial Officer  Work Status: Retired  Initial PHQ-9 Score: 0  Repeat PHQ9 per clinician?: No  Level of Depression Severity PHQ-9: None-Minimal 0-4  Initial COOP Quality of Life Survey Score: 15  Education/Intervention: Provide written information on stress / lifestyle management, Teach and support self-help strategies, Regular physical activity/exercise, Assist patient  with identifying stressors, Educate on positive coping mechanisms, Repeat psychosocial survey  Goals: Adequate support system, Positive coping mechanisms, Improvement in depression,anxiety, social support or health related quality of life, Improvement in PHQ9 survey score  Goal(s) Status: Goal partially met  Comment: Current stressor is r/t finances and getting Rx renewed.  Pt lives w/ spouse and family members.  Recently moved from [place] 3 years ago.  Notes close family support.  Likes to read and walk to relax.     Diabetes  Assessment: No diabetes (no hx of diabetes per pt and family; H&P noted hx of diabetes)  A1C Collection Date: 09/12/22  A1C (%): 5.2  Education/Intervention: Provide written information on diabetes management, Recommend RDN consult for MNT  Comment: Diabetes hx  noted in H&P.  Pt and family deny any hx of diabetes.  Pt does not take any medications; A!C 5.2     Lipid Management  Assessment: HLD  Lipid Profile Collection Date: 09/12/22  Total Cholesterol (mg/dL): 137  HDL (mg/dL): 38  LDL (mg/dL) : 88  Triglycerides (mg/dL): 55  VLDL (mg/dL): 11  Education/Intervention: Provide written information on lipid management, Medication compliance, Recommend RDN consult for MNT, Weight management, Reinforce RDN recommendations for heart healthy nutrition and lipid management  Goals: Total Chol <200, LDL (CAD) <70, LDL (non CAD) <100, Women HDL >50 mg/dl - Men HDL >40 mg/dl, Triglycerides <150  Goal(s) Status: Goal partially met  Comment: Cholesterol profile reviewed w/ pt and family.  Pt states adherence w/ Lipitor 80 mg daily.  Plans to meet w/ RD.     Heart Failure:  Assessment: No Heart Failure    Patient Stated Goals  What Matters Most?: "My health" (11/17/2022  6:00 PM)    Patient Stated Goal 1: "to take care of myself and be a healthy person"  Goal 1 Status: Goal partially met  Comment: Pt reports that he is feelilng better.  He is walking 40 min daily.  Patient Stated Goal 2: "return to exercise"   Goal 2 Status: Goal partially met  Comment: Pt has been walking 40 min daily and wants to increase.

## 2022-11-18 ENCOUNTER — Telehealth (INDEPENDENT_AMBULATORY_CARE_PROVIDER_SITE_OTHER): Payer: Self-pay | Admitting: Family Medicine

## 2022-11-18 ENCOUNTER — Telehealth (INDEPENDENT_AMBULATORY_CARE_PROVIDER_SITE_OTHER): Payer: Self-pay | Admitting: Gastroenterology

## 2022-11-18 ENCOUNTER — Inpatient Hospital Stay
Admission: RE | Admit: 2022-11-18 | Discharge: 2022-11-18 | Disposition: A | Discharge status: Home / Self Care | Payer: No Typology Code available for payment source | Source: Ambulatory Visit

## 2022-11-18 VITALS — Wt 165.6 lb

## 2022-11-18 DIAGNOSIS — I213 ST elevation (STEMI) myocardial infarction of unspecified site: Secondary | ICD-10-CM

## 2022-11-18 NOTE — Telephone Encounter (Signed)
I have sent them a message stating that he does not need iron supplements anymore. They can refer to the mychart message. Thanks.

## 2022-11-18 NOTE — Telephone Encounter (Signed)
Patient's daughter called requesting a medication for patient's iron deficiency. Patient can be reached at [phone]  to further advise, if needed. Please send medication to the following pharmacy:    [facility], [address]  [address] [address]  Phone: [phone] Fax: [phone]

## 2022-11-19 ENCOUNTER — Encounter (INDEPENDENT_AMBULATORY_CARE_PROVIDER_SITE_OTHER): Payer: Self-pay | Admitting: Family Medicine

## 2022-11-19 DIAGNOSIS — R972 Elevated prostate specific antigen [PSA]: Secondary | ICD-10-CM

## 2022-11-19 NOTE — Telephone Encounter (Signed)
We can hold off on ferrous sulfate for now since anemia has resolved. Thanks.

## 2022-11-22 ENCOUNTER — Inpatient Hospital Stay
Admission: RE | Admit: 2022-11-22 | Discharge: 2022-11-22 | Disposition: A | Discharge status: Home / Self Care | Payer: No Typology Code available for payment source | Source: Ambulatory Visit

## 2022-11-22 VITALS — Wt 167.3 lb

## 2022-11-22 DIAGNOSIS — I213 ST elevation (STEMI) myocardial infarction of unspecified site: Secondary | ICD-10-CM

## 2022-11-22 NOTE — Telephone Encounter (Signed)
Spoke with pt wife advising what the provider stated. Pt wife verbalized understanding.

## 2022-11-24 ENCOUNTER — Inpatient Hospital Stay
Admission: RE | Admit: 2022-11-24 | Discharge: 2022-11-24 | Disposition: A | Discharge status: Home / Self Care | Payer: No Typology Code available for payment source | Source: Ambulatory Visit

## 2022-11-24 VITALS — Wt 167.6 lb

## 2022-11-24 DIAGNOSIS — I213 ST elevation (STEMI) myocardial infarction of unspecified site: Secondary | ICD-10-CM

## 2022-11-25 ENCOUNTER — Inpatient Hospital Stay
Admission: RE | Admit: 2022-11-25 | Discharge: 2022-11-25 | Disposition: A | Discharge status: Home / Self Care | Payer: No Typology Code available for payment source | Source: Ambulatory Visit

## 2022-11-25 VITALS — Wt 168.0 lb

## 2022-11-25 DIAGNOSIS — I213 ST elevation (STEMI) myocardial infarction of unspecified site: Secondary | ICD-10-CM

## 2022-11-28 ENCOUNTER — Emergency Department
Admission: EM | Admit: 2022-11-28 | Discharge: 2022-11-28 | Disposition: A | Discharge status: Home / Self Care | Payer: No Typology Code available for payment source | Attending: Emergency Medicine | Admitting: Emergency Medicine

## 2022-11-28 DIAGNOSIS — X58XXXA Exposure to other specified factors, initial encounter: Secondary | ICD-10-CM | POA: Insufficient documentation

## 2022-11-28 DIAGNOSIS — S01501A Unspecified open wound of lip, initial encounter: Secondary | ICD-10-CM | POA: Insufficient documentation

## 2022-11-28 DIAGNOSIS — T148XXA Other injury of unspecified body region, initial encounter: Secondary | ICD-10-CM

## 2022-11-28 NOTE — Discharge Instructions (Signed)
Return immediately if worse in any way.  2.    Follow up with primary care for recheck.  Return to the emergency department if unable to follow up as instructed for any reason.

## 2022-11-28 NOTE — EDIE (Signed)
[facility]?NOTIFICATION?11/28/2022 14:43?Scovell, Davaris?MRN: [id number]    [facility]'s patient encounter information:   [id number]  Account [id number]  Billing Account [id number]      Criteria Met      5 ED Visits in 12 Months    Security and Safety  No Security Events were found.  ED Care Guidelines  There are currently no ED Care Guidelines for this patient. Please check your facility's medical records system.        Prescription Monitoring Program  Narx Score not available at this time.    E.D. Visit Count (12 mo.)  Facility Visits   [facility] 1   Total 6   Note: Visits indicate total known visits.     Recent Emergency Department Visit Summary  Date Facility [facility] Type Diagnoses or Chief Complaint    Nov 28, 2022  [place]  [place]  [place]  Emergency      lip injury      Sep 29, 2022  [place].  [place]  [place]  Emergency      ST elevation (STEMI) myocardial infarction of unspecified site      cath lab      Sep 29, 2022  [place].  [place]  [place]  Emergency      ST elevation (STEMI) myocardial infarction of unspecified site      Chest Pain      Chest Pains      Sep 11, 2022  [place].  [place]  [place]  Emergency      ST elevation (STEMI) myocardial infarction of unspecified site      shortness of breath, chest pain      Aug 11, 2022  [place].  [place]  [place]  Emergency      Acute bronchospasm      Acute bronchiolitis due to respiratory syncytial virus      Contact with and (suspected) exposure to COVID-19      URI      cough      Apr 19, 2022  [place].  [place]  [place]  Emergency      Epistaxis      fall; mouth/eye injury        Recent Inpatient Visit Summary  Date [place] Type Diagnoses or Chief Complaint    Sep 29, 2022  [place].  [place]  [place]  Medical Surgical      ST elevation (STEMI) myocardial infarction of unspecified site       Sep 11, 2022  [place].  [place]  [place]  Cardiology      Vitamin D deficiency, unspecified      ST elevation (STEMI) myocardial infarction of unspecified site        Care Team  No Care Team was found.  [facility]  This patient has registered at the [facility] Emergency Department  For more information visit: [email or web address]     PLEASE NOTE:     1.   Any care recommendations and other clinical information are provided as guidelines or for historical purposes only, and providers should exercise their own clinical judgment when providing care.    2.   You may only use this information for purposes of treatment, payment or health care operations activities, and subject to the  limitations of applicable [facility] Policies.    3.   You should consult directly with the organization that provided a care guideline or other clinical history with any questions about additional information or accuracy or completeness of information provided.    ? 2024 [facility] - [email or web address]

## 2022-11-28 NOTE — ED Provider Notes (Signed)
EMERGENCY DEPARTMENT NOTE     Patient initially seen and examined at   ED PHYSICIAN ASSIGNED       Date/Time Event User Comments    11/28/22 1459 Physician Assigned Davis Gourd, Maralyn Sago, MD assigned as Attending           ED MIDLEVEL (APP) ASSIGNED       None            HISTORY OF PRESENT ILLNESS     Chief Complaint: Laceration       67 y.o. male with past medical history as below   Patient nicked the skin just below his right lower lip shaving today.  Could not get the bleeding to stop.    Independent Historian:Family (list in HPI)  Additional History Provided by Independent Historian:  Past Medical History:  Past Medical History:   Diagnosis Date    Abnormal vision     Coronary artery disease     Hearing loss     both ears    Hyperlipidemia     Myocardial infarction     Seasonal allergic rhinitis     Vitamin D deficiency        Past Surgical History:  Past Surgical History:   Procedure Laterality Date    LEFT HEART CATH POSS PCI Left 09/11/2022    Procedure: Left Heart Cath Poss PCI;  Surgeon: Verlan Friends, MD;  Location: [facility];  Service: Cardiovascular;  Laterality: Left;    LEFT HEART CATH POSS PCI Left 09/29/2022    Procedure: Left Heart Cath Poss PCI;  Surgeon: Vonzell Schlatter, MD;  Location: [facility];  Service: Cardiovascular;  Laterality: Left;    NOSE SURGERY         Social History:  Social History     Socioeconomic History    Marital status: Married   Occupational History    Occupation: retired     Comment: Merchant navy officer   Tobacco Use    Smoking status: Never    Smokeless tobacco: Never   Vaping Use    Vaping Use: Never used   Substance and Sexual Activity    Alcohol use: Not Currently    Drug use: Never   Social History Narrative    Lives with wife, daughter in law and son.     And 2 sons 2 daughter      Social Determinants of Health     Food Insecurity: No Food Insecurity (11/28/2022)    Hunger Vital Sign     Worried About Running Out of Food in the Last Year:  Never true     [place] in the Last Year: Never true   Transportation Needs: Unknown (09/29/2022)    PRAPARE - Transportation     Lack of Transportation (Medical): No     Lack of Transportation (Non-Medical): Patient unable to answer   Physical Activity: Sufficiently Active (11/17/2022)    Exercise Vital Sign     Days of Exercise per Week: 7 days     Minutes of Exercise per Session: 40 min   Recent Concern: Physical Activity - Inactive (10/20/2022)    Exercise Vital Sign     Days of Exercise per Week: 0 days     Minutes of Exercise per Session: 30 min   Social Connections: Moderately Isolated (11/17/2022)    Social Connection and Isolation Panel [NHANES]     Frequency of Communication with Friends and Family: More than three  times a week     Frequency of Social Gatherings with Friends and Family: More than three times a week     Attends Religious Services: Never     Marine scientist or Organizations: No     Attends Archivist Meetings: Never     Marital Status: Married   Human resources officer Violence: Not At Risk (11/28/2022)    Humiliation, Afraid, Rape, and Kick questionnaire     Fear of Current or Ex-Partner: No     Emotionally Abused: No     Physically Abused: No     Sexually Abused: No   Housing Stability: Low Risk  (09/12/2022)    Housing Stability Vital Sign     Unable to Pay for Housing in the Last Year: No     Number of [place] in the Last Year: 1     Unstable Housing in the Last Year: No       Family History:  History reviewed. No pertinent family history.    Outpatient Medication:  Previous Medications    ASPIRIN EC 81 MG EC TABLET    Take 1 tablet (81 mg) by mouth daily    ATORVASTATIN (LIPITOR) 80 MG TABLET    Take 1 tablet (80 mg) by mouth nightly    METOPROLOL SUCCINATE XL (TOPROL-XL) 25 MG 24 HR TABLET    Take 1 tablet (25 mg) by mouth daily    PANTOPRAZOLE (PROTONIX) 40 MG TABLET    Take 1 tablet (40 mg) by mouth every morning before breakfast    TICAGRELOR (BRILINTA) 90 MG TAB     Take 1 tablet (90 mg) by mouth every 12 (twelve) hours    VITAMIN D, ERGOCALCIFEROL, (DRISDOL) 50000 UNIT CAP    Take 1 capsule (50,000 Units) by mouth once a week for the next 8 weeks.         REVIEW OF SYSTEMS   Review of Systems  Review of Systems   All other systems reviewed and are negative.     10 out of 14 review of systems completed (HENT, eyes, respiratory, CV, GI, GU, MSK, skin, neuro, psych) and negative except as stated in HPI.  PHYSICAL EXAM     ED Triage Vitals [11/28/22 1448]   Enc Vitals Group      BP 120/75      Heart Rate 60      Resp Rate 16      Temp 98 F (36.7 C)      Temp Source Oral      SpO2 98 %      Weight 77.1 kg      Height 1.702 m      Head Circumference       Peak Flow       Pain Score 0      Pain Loc       Pain Edu?       Excl. in [place]?          Gen: Vital signs reviewed, no distress.  Eyes: No conjunctival injection.  HENT: NCAT.  Neck:  No JVD.  Lungs:  No respiratory distress.    Extremities:  No CCE.  Skin:  Normal color.  Tiny skin avulsion below rt lower lip - bleeding  Neuro:  Grossly intact.  Psych:  Normal affect.      MEDICAL DECISION MAKING     PRIMARY PROBLEM LIST     Acute illness/injury DIAGNOSIS:  Final diagnoses:   Skin avulsion  DISCUSSION          I do not suspect severe sepsis or septic shock      Additional Notes                 Vital Signs: Reviewed the patient's vital signs.   Nursing Notes: Reviewed and utilized available nursing notes.  Medical Records Reviewed: Reviewed available past medical records.  Counseling: The emergency provider has spoken with the patient and discussed today's findings, in addition to providing specific details for the plan of care.  Questions are answered and there is agreement with the plan.        CARDIAC STUDIES    The following cardiac studies were independently interpreted by the Emergency Medicine Physician.  For full cardiac study results please see chart.      EMERGENCY IMAGING STUDIES    The following imagine  studies were independently interpreted by me (emergency medicine provider):      RADIOLOGY IMAGING STUDIES      No orders to display       EMERGENCY DEPT. MEDICATIONS      ED Medication Orders (From admission, onward)      None            LABORATORY RESULTS    Ordered and independently interpreted AVAILABLE laboratory tests.   Results       ** No results found for the last 24 hours. **            Had nurse irrigate wound and apply a pressure bandage to stay on for 24 hours.    CRITICAL CARE/PROCEDURES    Procedures    DIAGNOSIS      Diagnosis:  Final diagnoses:   Skin avulsion       Disposition:  ED Disposition       ED Disposition   Discharge    Condition   --    Date/Time   Sun Nov 28, 2022  3:09 PM    [place] discharge to home/self care.    Condition at disposition: Stable                 Prescriptions:  Patient's Medications   New Prescriptions    No medications on file   Previous Medications    ASPIRIN EC 81 MG EC TABLET    Take 1 tablet (81 mg) by mouth daily    ATORVASTATIN (LIPITOR) 80 MG TABLET    Take 1 tablet (80 mg) by mouth nightly    METOPROLOL SUCCINATE XL (TOPROL-XL) 25 MG 24 HR TABLET    Take 1 tablet (25 mg) by mouth daily    PANTOPRAZOLE (PROTONIX) 40 MG TABLET    Take 1 tablet (40 mg) by mouth every morning before breakfast    TICAGRELOR (BRILINTA) 90 MG TAB    Take 1 tablet (90 mg) by mouth every 12 (twelve) hours    VITAMIN D, ERGOCALCIFEROL, (DRISDOL) 50000 UNIT CAP    Take 1 capsule (50,000 Units) by mouth once a week for the next 8 weeks.   Modified Medications    No medications on file   Discontinued Medications    No medications on file       This note was generated by the Epic EMR system/ Dragon speech recognition and may contain inherent errors or omissions not intended by the user. Grammatical errors, random word insertions, deletions and pronoun errors  are occasional consequences of this technology due to software limitations. Not  all errors are caught or corrected. If  there are questions or concerns about the content of this note or information contained within the body of this dictation they should be addressed directly with the author for clarification.

## 2022-11-29 ENCOUNTER — Inpatient Hospital Stay
Admission: RE | Admit: 2022-11-29 | Discharge: 2022-11-29 | Disposition: A | Discharge status: Home / Self Care | Payer: No Typology Code available for payment source | Source: Ambulatory Visit

## 2022-11-29 VITALS — Wt 167.0 lb

## 2022-11-29 DIAGNOSIS — I213 ST elevation (STEMI) myocardial infarction of unspecified site: Secondary | ICD-10-CM

## 2022-12-01 ENCOUNTER — Inpatient Hospital Stay
Admission: RE | Admit: 2022-12-01 | Discharge: 2022-12-01 | Disposition: A | Discharge status: Home / Self Care | Payer: No Typology Code available for payment source | Source: Ambulatory Visit

## 2022-12-01 VITALS — Wt 168.7 lb

## 2022-12-01 DIAGNOSIS — I213 ST elevation (STEMI) myocardial infarction of unspecified site: Secondary | ICD-10-CM

## 2022-12-02 ENCOUNTER — Inpatient Hospital Stay
Admission: RE | Admit: 2022-12-02 | Discharge: 2022-12-02 | Disposition: A | Discharge status: Home / Self Care | Payer: No Typology Code available for payment source | Source: Ambulatory Visit

## 2022-12-02 VITALS — Wt 167.5 lb

## 2022-12-02 DIAGNOSIS — M25562 Pain in left knee: Secondary | ICD-10-CM | POA: Diagnosis present

## 2022-12-02 DIAGNOSIS — I213 ST elevation (STEMI) myocardial infarction of unspecified site: Secondary | ICD-10-CM | POA: Diagnosis present

## 2022-12-02 DIAGNOSIS — G8929 Other chronic pain: Secondary | ICD-10-CM | POA: Diagnosis present

## 2022-12-02 DIAGNOSIS — Z974 Presence of external hearing-aid: Secondary | ICD-10-CM

## 2022-12-02 DIAGNOSIS — I2510 Atherosclerotic heart disease of native coronary artery without angina pectoris: Secondary | ICD-10-CM | POA: Diagnosis present

## 2022-12-02 NOTE — Addendum Note (Signed)
Encounter addended by: Burley Saver on: 12/02/2022 6:43 PM   Actions taken: Flowsheet accepted

## 2022-12-06 ENCOUNTER — Inpatient Hospital Stay
Admission: RE | Admit: 2022-12-06 | Discharge: 2022-12-06 | Disposition: A | Discharge status: Home / Self Care | Payer: No Typology Code available for payment source | Source: Ambulatory Visit

## 2022-12-06 ENCOUNTER — Telehealth: Payer: Self-pay

## 2022-12-06 VITALS — Wt 166.6 lb

## 2022-12-06 DIAGNOSIS — I213 ST elevation (STEMI) myocardial infarction of unspecified site: Secondary | ICD-10-CM

## 2022-12-06 NOTE — Telephone Encounter (Signed)
Lvm on son's phone. Per Dr Rich Number, pt to either make an apt with his office or Dr Tamala Julian office to have heart monitor placed after ectopy at most recent CR session.

## 2022-12-08 ENCOUNTER — Inpatient Hospital Stay
Admission: RE | Admit: 2022-12-08 | Discharge: 2022-12-08 | Disposition: A | Discharge status: Home / Self Care | Payer: No Typology Code available for payment source | Source: Ambulatory Visit

## 2022-12-08 VITALS — Wt 168.0 lb

## 2022-12-08 DIAGNOSIS — I213 ST elevation (STEMI) myocardial infarction of unspecified site: Secondary | ICD-10-CM

## 2022-12-09 ENCOUNTER — Telehealth (INDEPENDENT_AMBULATORY_CARE_PROVIDER_SITE_OTHER): Payer: Self-pay | Admitting: Cardiology

## 2022-12-09 ENCOUNTER — Other Ambulatory Visit (INDEPENDENT_AMBULATORY_CARE_PROVIDER_SITE_OTHER): Payer: Self-pay | Admitting: Cardiology

## 2022-12-09 ENCOUNTER — Other Ambulatory Visit (INDEPENDENT_AMBULATORY_CARE_PROVIDER_SITE_OTHER): Payer: Self-pay | Admitting: Family Medicine

## 2022-12-09 ENCOUNTER — Inpatient Hospital Stay
Admission: RE | Admit: 2022-12-09 | Discharge: 2022-12-09 | Disposition: A | Discharge status: Home / Self Care | Payer: No Typology Code available for payment source | Source: Ambulatory Visit

## 2022-12-09 VITALS — Wt 169.0 lb

## 2022-12-09 DIAGNOSIS — I213 ST elevation (STEMI) myocardial infarction of unspecified site: Secondary | ICD-10-CM

## 2022-12-09 DIAGNOSIS — E559 Vitamin D deficiency, unspecified: Secondary | ICD-10-CM

## 2022-12-09 NOTE — Telephone Encounter (Signed)
Patient's daughter informed us that Cardiac Rehab has contacted her and advised that Dr. Rich Number has requested for the patient to schedule an appointment for a Holter Monitor. However, we do not current have an order in the patient's chart     Lenoria Farrier (Daughter)   [phone]     [facility]  [facility]

## 2022-12-09 NOTE — Telephone Encounter (Signed)
LOV:10/11/2022  No future Visit

## 2022-12-10 DIAGNOSIS — H903 Sensorineural hearing loss, bilateral: Secondary | ICD-10-CM | POA: Insufficient documentation

## 2022-12-10 MED ORDER — ASPIRIN 81 MG PO TBEC
81.0000 mg | DELAYED_RELEASE_TABLET | Freq: Every day | ORAL | 3 refills | Status: DC
Start: 2022-12-10 — End: 2023-02-08

## 2022-12-10 NOTE — Addendum Note (Signed)
Encounter addended by: Randon Goldsmith, RN on: 12/10/2022 11:13 AM   Actions taken: Charge Capture section accepted

## 2022-12-11 MED ORDER — VITAMIN D (ERGOCALCIFEROL) 1.25 MG (50000 UT) PO CAPS
50000.0000 [IU] | ORAL_CAPSULE | ORAL | 0 refills | Status: DC
Start: 2022-12-11 — End: 2023-11-02

## 2022-12-13 ENCOUNTER — Other Ambulatory Visit (INDEPENDENT_AMBULATORY_CARE_PROVIDER_SITE_OTHER): Payer: Self-pay

## 2022-12-13 ENCOUNTER — Inpatient Hospital Stay
Admission: RE | Admit: 2022-12-13 | Discharge: 2022-12-13 | Disposition: A | Discharge status: Home / Self Care | Payer: No Typology Code available for payment source | Source: Ambulatory Visit | Attending: Student in an Organized Health Care Education/Training Program | Admitting: Student in an Organized Health Care Education/Training Program

## 2022-12-13 ENCOUNTER — Other Ambulatory Visit (INDEPENDENT_AMBULATORY_CARE_PROVIDER_SITE_OTHER): Payer: Self-pay | Admitting: Cardiology

## 2022-12-13 VITALS — Wt 168.2 lb

## 2022-12-13 DIAGNOSIS — Z5189 Encounter for other specified aftercare: Secondary | ICD-10-CM | POA: Insufficient documentation

## 2022-12-13 DIAGNOSIS — I252 Old myocardial infarction: Secondary | ICD-10-CM | POA: Insufficient documentation

## 2022-12-13 DIAGNOSIS — I213 ST elevation (STEMI) myocardial infarction of unspecified site: Secondary | ICD-10-CM

## 2022-12-13 DIAGNOSIS — I493 Ventricular premature depolarization: Secondary | ICD-10-CM

## 2022-12-14 ENCOUNTER — Telehealth (INDEPENDENT_AMBULATORY_CARE_PROVIDER_SITE_OTHER): Payer: Self-pay

## 2022-12-14 NOTE — Telephone Encounter (Signed)
Order for a holter monitor has been placed and she can call to make an appointment to have it placed.

## 2022-12-14 NOTE — Telephone Encounter (Signed)
Order for Holter monitor placed yesterday by Dr. Tamala Julian.

## 2022-12-15 ENCOUNTER — Inpatient Hospital Stay
Admission: RE | Admit: 2022-12-15 | Discharge: 2022-12-15 | Disposition: A | Discharge status: Home / Self Care | Payer: No Typology Code available for payment source | Source: Ambulatory Visit

## 2022-12-15 VITALS — Wt 168.2 lb

## 2022-12-15 DIAGNOSIS — I213 ST elevation (STEMI) myocardial infarction of unspecified site: Secondary | ICD-10-CM

## 2022-12-15 NOTE — Cardiac Rehab ITP (Signed)
Cardiac Rehab Individual Treatment Plan    Referring Provider: Otilio Miu, MD   Comments: Per H&P in Epic, pt has a PMH significant for CAD, diabetes and hyperlipidemia. Pt presented to the hospital on 09/01/2022 with a STEMI and was taken to the Cath Lab where he had a PCI with 3 DES to his Lt circumflex.  Pt returned to the hospital w/ c/o chest pain for which STEMI was activated.  Patient was taken to cardiac catheterization lab on 09/29/2021.  Cardiac catheterization noted mild CAD in the LAD and RCA.  Prior stents were noted to be patent and no intervention was performed.  Cardiology was consulted and recommended continuation of medical management with DAPT, high intensity statin and metoprolol. They cleared for discharge and recommended cardiac rehab outpatient with cardiology follow up.  Pt had cardiology f/u on 10/07/2022 w/ notation of patient's wife stating pt had only been taking Brilinta once daily and CM re-educating pt to take twice a day.  Pt continued to c/o right arm pain for which he was evaluated at cardiology f/u; pt reported his right arm  improved with use of the massage gun at home. Case Manager note in [place] dated 1/30/204 notes pt's spouse infomed CM of difficulty with obtaining patient's medication (metoprolol).  Pt now reports he is taking his Metoprolol.    Assessment Period  Phase: 40 Day Re-assessment  Program : Currently enrolled in Phase 2  Session #: 45  First Exercise Session (Date): 10/20/22  Referring Diagnosis: STEMI  Date of Event: 09/01/22  Additional Cardiac History: PCI  AACVPR Risk Stratification: Moderate risk  Ejection Fraction: 49%  Ejection Fraction (Test): Echo  Ejection Fraction Study Date: 09/30/2022  Comment: Per H&P in Epic, pt has a PMH significant for CAD, diabetes and hyperlipidemia. Pt presented to the hospital on 09/01/2022 with a STEMI and was taken to the Cath Lab where he had a PCI with 3 DES to his Lt circumflex.  Pt returned to the hospital w/ c/o  chest pain for which STEMI was activated.  Patient was taken to cardiac catheterization lab on 09/29/2021.  Cardiac catheterization noted mild CAD in the LAD and RCA.  Prior stents were noted to be patent and no intervention was performed.  Cardiology was consulted and recommended continuation of medical management with DAPT, high intensity statin and metoprolol. They cleared for discharge and recommended cardiac rehab outpatient with cardiology follow up.  Pt had cardiology f/u on 10/07/2022 w/ notation of patient's wife stating pt had only been taking Brilinta once daily and CM re-educating pt to take twice a day.  Pt continued to c/o right arm pain for which he was evaluated at cardiology f/u; pt reported his right arm  improved with use of the massage gun at home. Case Manager note in [place] dated 1/30/204 notes pt's spouse infomed CM of difficulty with obtaining patient's medication (metoprolol).  Pt now reports he is taking his Metoprolol.    Handout Provided: Blood Pressure, SMART goal setting, Healthy nutrition tips, Exercising safely at home     Exercise  Assessment: Exercise history 2 weeks prior to cardiac event pre cardiac event, Exercise history 2 weeks pre cardiac rehab/post cardiac event  Work Related Physical Requirements: no/retired  Physical Limitations: No  Where: Walk (light walk 7 days per week x 30 to 40 minutes)  On average, how many days per week do you engage in moderate to strenuous exercise (like a brisk walk)?: 7 days  On average, how many minutes  do you engage in exercise at this level?: 50 min  Strength Training: Yes (w/ PT light weights)  Interval Training: No  Session #3 MET Level: 2.5 (TM)  Current Peak MET Level: 5.7 (Tm)  Peak MET Goal: 3.5  MET Level Percentage Increase (%): 128  Adherence: Adheres to current cardiac rehab exercise prescription  Comment: Pt attends CR 3x/week. He states that at home he walks for 45-60 minutes everday.     Exercise Prescription/Plan  40% Heart Rate  Reserve: 93  80% Heart Rate Reserve: 132  Frequency: Cardiac Rehab 3 days/week, Home 7 days/week  RPE Range: 11-14  METS: 6-8 or as tolerated  Time: 40-49 minutes  Mode: Treadmill, Nu Step, Walking  Strength Training: Begin when cleared by CR clinician  Education/Intervention: Provide written information on exercise topics, Educate on the cardiovascular benefits of exercise, Develop a home exercise plan, Educate on strength training guidelines, Educate on RPE, THR, warm up/cool down, Self monitoring using pulse taking, heart rate monitor, activity tracker, Progress time and intensity when a steady state of HR and RPE occur, Educate on signs/symptoms of cardiovascular compromise  Goals: Increase in METS by at least 40%, Cardiovascular exercise 150-300 minutes per week, Strength exercise 2 -3 days per week, Avoid inactivity  Goal(s) Status: Goal partially met  Comment: Exercise prescription as above. Pt has been doing well on TM and NuStep along with UE strength training. Pt also interested in walk/run intervals. Continue to increase exercise intensity as tolerated, aiming for a 0.5-1 MET level goal inthe next month.     Nutrition  Assessment: Pending  Height: 170.2 cm (5\' 7" )  Initial Weight (lbs): 170.1lb  Weight (lbs): 168.2lb  BMI (calculated): 26.3  BMI Classification: Overweight BMI 25-29.9  Initial Nutrition Assessment Survey Score : 13  Initial Nutrition Assessment Survey Score Indicates: 7-13: Demonstrates some nutrition strategies to support reduction in CV risk, meet with RDN for MNT     Nutrition Plan  Current Eating Plan: Unspecified nutrition plan  Fluid Restriction: No fluid restriction  Consult Status: Consult not yet scheduled  CRP Education/Intervention: Provide written information on heart healthy nutrition  topics, Educate on the role of nutrition on BP control, Educate on the role of nutrition in lipid management, Hydration Guidelines, Educate patient on heart healthy food choices, Educate on the  health benefits of weight management  Goals: Decrease body weight, Modify nutrient intake, Heart healthy eating  Goal(s) Status: Not applicable on initial assessment  Comment: Pt reports eating a low of fruits and vegetables. He also eats low fat-dairy products and tries to get his family to cook lower-fat meals for dinner.     Psychosocial  Assessment: Positive coping mechanisms, Adequate support system  In a typical week, how many times do you talk on the phone with family, friends, or neighbors?: More than three times a week  How often do you get together with friends or relatives?: More than three times a week  How often do you attend church or religious services?: Never  Do you belong to any clubs or organizations such as church groups, unions, fraternal or athletic groups, or school groups?: No  How often do you attend meetings of the clubs or organizations you belong to?: Never  Are you married, widowed, divorced, separated, never married, or living with a partner?: Married  Barriers to Learning: Language  Barriers to Attendance: None  Occupation: Chief Financial Officer  Work Status: Retired  Initial PHQ-9 Score: 0  Repeat PHQ9 per clinician?: No  Level  of Depression Severity PHQ-9: None-Minimal 0-4  Initial COOP Quality of Life Survey Score: 15  Education/Intervention: Provide written information on stress / lifestyle management, Teach and support self-help strategies, Regular physical activity/exercise, Assist patient with identifying stressors, Educate on positive coping mechanisms, Repeat psychosocial survey  Goals: Adequate support system, Positive coping mechanisms, Improvement in depression,anxiety, social support or health related quality of life, Improvement in PHQ9 survey score  Goal(s) Status: Goal partially met  Comment: Pt reports having a food support system in his family who his lives with. He reports still having financial concerns that causes stress. To manage his stress he likes to read or take a walk.      Lipid Management  Assessment: HLD  Lipid Profile Collection Date: 09/12/22  Total Cholesterol (mg/dL): 137  HDL (mg/dL): 38  LDL (mg/dL) : 88  Triglycerides (mg/dL): 55  VLDL (mg/dL): 11  Education/Intervention: Provide written information on lipid management, Medication compliance, Recommend RDN consult for MNT, Weight management, Reinforce RDN recommendations for heart healthy nutrition and lipid management  Goals: Total Chol <200, LDL (CAD) <70, LDL (non CAD) <100, Women HDL >50 mg/dl - Men HDL >40 mg/dl, Triglycerides <150  Goal(s) Status: Goal partially met  Comment: Pt states adherence to statin therapy. He has been eating a lower-fat diet and increasing his fiber intake.     Heart Failure:  Assessment: No Heart Failure    SET-PAD:  Assessment: No SET PAD    Patient Stated Goals  What Matters Most?: "My health" (12/15/2022  5:00 PM)    Patient Stated Goal 1: "to take care of myself and be a healthy person"  Goal 1 Status: Goal partially met  Comment: Pt states that he is feeling much better. He is greatful to the CR staff for giving his education, instruction, and guidance on diet and exercise. He also reports that his chest pain has gotten better now that he is exercising more.  Patient Stated Goal 2: "return to exercise"   Goal 2 Status: Goal partially met  Comment: Pt is continuing to walk for 45-60 minutes daily, however he has not gotten back into running or doing his previous exercises.

## 2022-12-16 ENCOUNTER — Inpatient Hospital Stay
Admission: RE | Admit: 2022-12-16 | Discharge: 2022-12-16 | Disposition: A | Discharge status: Home / Self Care | Payer: No Typology Code available for payment source | Source: Ambulatory Visit

## 2022-12-16 VITALS — Wt 167.3 lb

## 2022-12-16 DIAGNOSIS — I213 ST elevation (STEMI) myocardial infarction of unspecified site: Secondary | ICD-10-CM

## 2022-12-17 ENCOUNTER — Ambulatory Visit (INDEPENDENT_AMBULATORY_CARE_PROVIDER_SITE_OTHER): Payer: No Typology Code available for payment source

## 2022-12-20 ENCOUNTER — Inpatient Hospital Stay
Admission: RE | Admit: 2022-12-20 | Discharge: 2022-12-20 | Disposition: A | Discharge status: Home / Self Care | Payer: No Typology Code available for payment source | Source: Ambulatory Visit

## 2022-12-20 VITALS — Wt 169.4 lb

## 2022-12-20 DIAGNOSIS — I213 ST elevation (STEMI) myocardial infarction of unspecified site: Secondary | ICD-10-CM

## 2022-12-21 ENCOUNTER — Ambulatory Visit (INDEPENDENT_AMBULATORY_CARE_PROVIDER_SITE_OTHER): Payer: No Typology Code available for payment source | Admitting: Cardiology

## 2022-12-22 ENCOUNTER — Inpatient Hospital Stay
Admission: RE | Admit: 2022-12-22 | Discharge: 2022-12-22 | Disposition: A | Discharge status: Home / Self Care | Payer: No Typology Code available for payment source | Source: Ambulatory Visit

## 2022-12-22 VITALS — Wt 167.4 lb

## 2022-12-22 DIAGNOSIS — I213 ST elevation (STEMI) myocardial infarction of unspecified site: Secondary | ICD-10-CM

## 2022-12-23 ENCOUNTER — Inpatient Hospital Stay: Admission: RE | Admit: 2022-12-23 | Payer: No Typology Code available for payment source | Source: Ambulatory Visit

## 2022-12-24 ENCOUNTER — Encounter (INDEPENDENT_AMBULATORY_CARE_PROVIDER_SITE_OTHER): Payer: No Typology Code available for payment source

## 2022-12-24 ENCOUNTER — Encounter (INDEPENDENT_AMBULATORY_CARE_PROVIDER_SITE_OTHER): Payer: Self-pay | Admitting: Family Medicine

## 2022-12-24 ENCOUNTER — Other Ambulatory Visit (INDEPENDENT_AMBULATORY_CARE_PROVIDER_SITE_OTHER): Payer: Self-pay | Admitting: Cardiology

## 2022-12-24 DIAGNOSIS — I2510 Atherosclerotic heart disease of native coronary artery without angina pectoris: Secondary | ICD-10-CM

## 2022-12-24 MED ORDER — METOPROLOL SUCCINATE ER 25 MG PO TB24
25.0000 mg | ORAL_TABLET | Freq: Every day | ORAL | 3 refills | Status: DC
Start: 2022-12-24 — End: 2023-03-28

## 2022-12-27 ENCOUNTER — Inpatient Hospital Stay
Admission: RE | Admit: 2022-12-27 | Discharge: 2022-12-27 | Disposition: A | Discharge status: Home / Self Care | Payer: No Typology Code available for payment source | Source: Ambulatory Visit

## 2022-12-27 VITALS — Wt 165.8 lb

## 2022-12-27 DIAGNOSIS — I213 ST elevation (STEMI) myocardial infarction of unspecified site: Secondary | ICD-10-CM

## 2022-12-29 ENCOUNTER — Inpatient Hospital Stay
Admission: RE | Admit: 2022-12-29 | Discharge: 2022-12-29 | Disposition: A | Discharge status: Home / Self Care | Payer: No Typology Code available for payment source | Source: Ambulatory Visit

## 2022-12-29 ENCOUNTER — Ambulatory Visit (INDEPENDENT_AMBULATORY_CARE_PROVIDER_SITE_OTHER): Payer: No Typology Code available for payment source

## 2022-12-29 VITALS — Wt 167.7 lb

## 2022-12-29 DIAGNOSIS — I213 ST elevation (STEMI) myocardial infarction of unspecified site: Secondary | ICD-10-CM

## 2022-12-30 ENCOUNTER — Inpatient Hospital Stay
Admission: RE | Admit: 2022-12-30 | Discharge: 2022-12-30 | Disposition: A | Discharge status: Home / Self Care | Payer: No Typology Code available for payment source | Source: Ambulatory Visit

## 2022-12-30 VITALS — Wt 168.0 lb

## 2022-12-30 DIAGNOSIS — I213 ST elevation (STEMI) myocardial infarction of unspecified site: Secondary | ICD-10-CM

## 2023-01-03 ENCOUNTER — Inpatient Hospital Stay
Admission: RE | Admit: 2023-01-03 | Discharge: 2023-01-03 | Disposition: A | Discharge status: Home / Self Care | Payer: No Typology Code available for payment source | Source: Ambulatory Visit

## 2023-01-03 VITALS — Wt 167.8 lb

## 2023-01-03 DIAGNOSIS — I213 ST elevation (STEMI) myocardial infarction of unspecified site: Secondary | ICD-10-CM

## 2023-01-05 ENCOUNTER — Inpatient Hospital Stay
Admission: RE | Admit: 2023-01-05 | Discharge: 2023-01-05 | Disposition: A | Discharge status: Home / Self Care | Payer: No Typology Code available for payment source | Source: Ambulatory Visit

## 2023-01-05 VITALS — Wt 167.8 lb

## 2023-01-05 DIAGNOSIS — I213 ST elevation (STEMI) myocardial infarction of unspecified site: Secondary | ICD-10-CM

## 2023-01-05 LAB — MOBILE CARDIAC TELEMETRY
# of A-fib episodes: 0
# of PACs: 1260
# of PVC beats in couplets: 0
# of PVCs: 1509
# of SVT episodes: 21
# of VT epidoses: 1
# of pauses: 0
% Noise burden: 3.46 %
% PACs (burden): 0.1 %
% PVCs (burden): 0.12 %
Average HR: 63 {beats}/min
Longest SVT Episode: 25.7 s
Longest VT Episode: 0.97 s
Max HR: 147 {beats}/min
Min HR: 47 {beats}/min
Time in A-fib (burden): 0 %

## 2023-01-06 ENCOUNTER — Inpatient Hospital Stay: Admission: RE | Admit: 2023-01-06 | Payer: No Typology Code available for payment source | Source: Ambulatory Visit

## 2023-01-10 ENCOUNTER — Inpatient Hospital Stay
Admission: RE | Admit: 2023-01-10 | Discharge: 2023-01-10 | Disposition: A | Discharge status: Home / Self Care | Payer: No Typology Code available for payment source | Source: Ambulatory Visit

## 2023-01-10 VITALS — Wt 167.7 lb

## 2023-01-10 DIAGNOSIS — I213 ST elevation (STEMI) myocardial infarction of unspecified site: Secondary | ICD-10-CM

## 2023-01-12 ENCOUNTER — Inpatient Hospital Stay
Admission: RE | Admit: 2023-01-12 | Discharge: 2023-01-12 | Disposition: A | Discharge status: Home / Self Care | Payer: No Typology Code available for payment source | Source: Ambulatory Visit | Attending: Student in an Organized Health Care Education/Training Program | Admitting: Student in an Organized Health Care Education/Training Program

## 2023-01-12 VITALS — Wt 167.6 lb

## 2023-01-12 DIAGNOSIS — I252 Old myocardial infarction: Secondary | ICD-10-CM | POA: Insufficient documentation

## 2023-01-12 DIAGNOSIS — Z5189 Encounter for other specified aftercare: Secondary | ICD-10-CM | POA: Insufficient documentation

## 2023-01-12 DIAGNOSIS — I213 ST elevation (STEMI) myocardial infarction of unspecified site: Secondary | ICD-10-CM | POA: Insufficient documentation

## 2023-01-13 ENCOUNTER — Inpatient Hospital Stay
Admission: RE | Admit: 2023-01-13 | Discharge: 2023-01-13 | Disposition: A | Discharge status: Home / Self Care | Payer: No Typology Code available for payment source | Source: Ambulatory Visit

## 2023-01-13 VITALS — Wt 167.3 lb

## 2023-01-13 DIAGNOSIS — I213 ST elevation (STEMI) myocardial infarction of unspecified site: Secondary | ICD-10-CM

## 2023-01-13 NOTE — Cardiac Rehab ITP (Signed)
Cardiac Rehab Individual Treatment Plan    Referring Provider: Renard Hamper, MD   Comments: Pt regularly attends CR 3x week. Per H&P in Epic, pt has a PMH significant for CAD, diabetes and hyperlipidemia. Pt presented to the hospital on 09/01/2022 with a STEMI and was taken to the Cath Lab where he had a PCI with 3 DES to his Lt circumflex.  Pt returned to the hospital w/ c/o chest pain for which STEMI was activated.  Patient was taken to cardiac catheterization lab on 09/29/2021.  Cardiac catheterization noted mild CAD in the LAD and RCA.  Prior stents were noted to be patent and no intervention was performed.  Cardiology was consulted and recommended continuation of medical management with DAPT, high intensity statin and metoprolol. They cleared for discharge and recommended cardiac rehab outpatient with cardiology follow up.    Assessment Period  Phase: 30 Day Re-assessment  Program : Currently enrolled in Phase 2  Session #: 35  First Exercise Session (Date): 10/20/22  Referring Diagnosis: STEMI  Date of Event: 09/01/22  Additional Cardiac History: PCI  AACVPR Risk Stratification: Moderate risk  Ejection Fraction: 49%  Ejection Fraction (Test): Echo  Ejection Fraction Study Date: 09/30/2022  Comment: Pt regularly attends CR 3x week. Per H&P in Epic, pt has a PMH significant for CAD, diabetes and hyperlipidemia. Pt presented to the hospital on 09/01/2022 with a STEMI and was taken to the Cath Lab where he had a PCI with 3 DES to his Lt circumflex.  Pt returned to the hospital w/ c/o chest pain for which STEMI was activated.  Patient was taken to cardiac catheterization lab on 09/29/2021.  Cardiac catheterization noted mild CAD in the LAD and RCA.  Prior stents were noted to be patent and no intervention was performed.  Cardiology was consulted and recommended continuation of medical management with DAPT, high intensity statin and metoprolol. They cleared for discharge and recommended cardiac rehab outpatient  with cardiology follow up.    Handout Provided: SMART goal setting, METs and Ejection Fraction, Cardiac risk factors, Blood Pressure, Weather guidelines (hot/cold), Stress Management, Exercising safely at home, Healthy nutrition tips     Exercise  Assessment: Exercise history 2 weeks prior to cardiac event pre cardiac event, Exercise history 2 weeks pre cardiac rehab/post cardiac event  Work Related Physical Requirements: no/retired  Physical Limitations: No  Current Exercise: Yes  Where: Walk, Cardiac Rehab  On average, how many days per week do you engage in moderate to strenuous exercise (like a brisk walk)?: 7 days  On average, how many minutes do you engage in exercise at this level?: 50 min  Strength Training: Yes  Interval Training: Yes  Session #3 MET Level: 2.5 (TM)  Current Peak MET Level: 9.6 (TM)  Peak MET Goal: 3.5  MET Level Percentage Increase (%): 284  Adherence: Adheres to current cardiac rehab exercise prescription  Comment: Pt attends CR 3x week. He walks and runs in the park everyday for about 50 minutes total. He will do 5 minutes run intervals, he reports his total run time is about 10-15 minutes.     Exercise Prescription/Plan  40% Heart Rate Reserve: 93  80% Heart Rate Reserve: 132  Frequency: Cardiac Rehab 3 days/week, Home 7 days/week  RPE Range: 11-15  METS: 9-11 (or as tolerated)  Time: 40-49 minutes  Mode: Treadmill, Nu Step, Walking, Jogging  Strength Training: 2-3 days per week, 10-15 reps in 1-3 sets to moderate fatigue, Free weights  Education/Intervention: Provide written  information on exercise topics, Educate on the cardiovascular benefits of exercise, Develop a home exercise plan, Educate on strength training guidelines, Educate on RPE, THR, warm up/cool down, Self monitoring using pulse taking, heart rate monitor, activity tracker, Progress time and intensity when a steady state of HR and RPE occur, Educate on signs/symptoms of cardiovascular compromise  Goals: Increase in METS  by at least 40%, Cardiovascular exercise 150-300 minutes per week, Strength exercise 2 -3 days per week, Avoid inactivity  Goal(s) Status: Goal met  Comment: Orient to walk/run intervals on TM at CR on 12/20/2022. Pt is eager to begin running outside again. Will continue to progress TM speed as tolerated. Rest of Ex Rx is as listed above, will continue to progress as tolerated and based on sxs and vitals response to exercise     Nutrition  Assessment: Completed  Height: 170.2 cm (5\' 7" )  Initial Weight (lbs): 170.1lb  Weight (lbs): 167.3lb  BMI (calculated): 26.2  BMI Classification: Overweight BMI 25-29.9  Initial Nutrition Assessment Survey Score : 13  Initial Nutrition Assessment Survey Score Indicates: 7-13: Demonstrates some nutrition strategies to support reduction in CV risk, meet with RDN for MNT     Nutrition Plan  Current Eating Plan: Unspecified nutrition plan  Fluid Restriction: No fluid restriction  Consult Status: Not Completed  CRP Education/Intervention: Provide written information on heart healthy nutrition  topics, Educate on the role of nutrition on BP control, Educate on the role of nutrition in lipid management, Hydration Guidelines, Educate patient on heart healthy food choices, Educate on the health benefits of weight management  Goals: Decrease body weight, Modify nutrient intake, Heart healthy eating  Goal(s) Status: Goal partially met  Comment: Pt continues to report eating a lot of fruits and vegetables. He also eats low fat-dairy products and tries to get his family to cook lower-fat meals for dinner. Pt wanted information on low sodium diet, pt provided with educational handout on blood pressure/sodium     Psychosocial  Assessment: Positive coping mechanisms, Adequate support system  In a typical week, how many times do you talk on the phone with family, friends, or neighbors?: More than three times a week  How often do you get together with friends or relatives?: More than three times a  week  How often do you attend church or religious services?: Never  Do you belong to any clubs or organizations such as church groups, unions, fraternal or athletic groups, or school groups?: No  How often do you attend meetings of the clubs or organizations you belong to?: Never  Are you married, widowed, divorced, separated, never married, or living with a partner?: Married  Barriers to Learning: Language  Barriers to Attendance: None  Occupation: Art gallery manager  Work Status: Retired  Initial PHQ-9 Score: 0  Repeat PHQ9 per clinician?: No  Level of Depression Severity PHQ-9: None-Minimal 0-4  Initial COOP Quality of Life Survey Score: 15  Education/Intervention: Provide written information on stress / lifestyle management, Teach and support self-help strategies, Regular physical activity/exercise, Assist patient with identifying stressors, Educate on positive coping mechanisms, Repeat psychosocial survey  Goals: Adequate support system, Positive coping mechanisms, Improvement in depression,anxiety, social support or health related quality of life, Improvement in PHQ9 survey score  Goal(s) Status: Goal partially met  Comment: Pt reports having a good support system with his family who he lives with. He reports no/low stress     Lipid Management  Assessment: HLD  Lipid Profile Collection Date: 09/12/22  Total Cholesterol (  mg/dL): 161  HDL (mg/dL): 38  LDL (mg/dL) : 88  Triglycerides (mg/dL): 55  VLDL (mg/dL): 11  Education/Intervention: Provide written information on lipid management, Medication compliance, Recommend RDN consult for MNT, Weight management, Reinforce RDN recommendations for heart healthy nutrition and lipid management  Goals: Total Chol <200, LDL (CAD) <70, LDL (non CAD) <100, Women HDL >50 mg/dl - Men HDL >09 mg/dl, Triglycerides <604  Goal(s) Status: Goal met  Comment: No new lipid panel. Pt verbalizes daily compliance with statin therapy     Heart Failure:  Assessment: No Heart  Failure    SET-PAD:  Assessment: No SET PAD    Patient Stated Goals  What Matters Most?: "My health" (12/15/2022  5:00 PM)      Patient Stated Goal 1: "to take care of myself and be a healthy person"  Goal 1 Status: Goal met  Comment: Pt reports he reached his goal he "is feeling healthy"  Patient Stated Goal 2: "return to exercise"   Goal 2 Status: Goal met  Comment: Pt is walking and running in the park every day

## 2023-01-14 ENCOUNTER — Encounter (INDEPENDENT_AMBULATORY_CARE_PROVIDER_SITE_OTHER): Payer: Self-pay | Admitting: Family Medicine

## 2023-01-14 ENCOUNTER — Ambulatory Visit (INDEPENDENT_AMBULATORY_CARE_PROVIDER_SITE_OTHER): Payer: No Typology Code available for payment source | Admitting: Family Medicine

## 2023-01-14 VITALS — BP 112/69 | HR 58 | Temp 97.8°F | Ht 66.5 in | Wt 169.0 lb

## 2023-01-14 DIAGNOSIS — D500 Iron deficiency anemia secondary to blood loss (chronic): Secondary | ICD-10-CM

## 2023-01-14 DIAGNOSIS — Z23 Encounter for immunization: Secondary | ICD-10-CM

## 2023-01-14 DIAGNOSIS — Z125 Encounter for screening for malignant neoplasm of prostate: Secondary | ICD-10-CM

## 2023-01-14 DIAGNOSIS — Z111 Encounter for screening for respiratory tuberculosis: Secondary | ICD-10-CM

## 2023-01-14 DIAGNOSIS — R7612 Nonspecific reaction to cell mediated immunity measurement of gamma interferon antigen response without active tuberculosis: Secondary | ICD-10-CM

## 2023-01-14 DIAGNOSIS — E559 Vitamin D deficiency, unspecified: Secondary | ICD-10-CM

## 2023-01-14 MED ORDER — VITAMIN D 50 MCG (2000 UT) PO TABS
50.0000 ug | ORAL_TABLET | Freq: Every day | ORAL | 3 refills | Status: DC
Start: 2023-01-14 — End: 2023-02-11

## 2023-01-14 MED ORDER — HEPATITIS B VAC RECOMBINANT 20 MCG/ML IJ SUSY
1.0000 mL | PREFILLED_SYRINGE | Freq: Once | INTRAMUSCULAR | 0 refills | Status: AC
Start: 2023-01-14 — End: 2023-01-14

## 2023-01-14 MED ORDER — COVID-19 MRNA VACCINE (PFIZER) 30 MCG/0.3ML IM SUSP
30.0000 ug | Freq: Once | INTRAMUSCULAR | 0 refills | Status: AC
Start: 2023-01-14 — End: 2023-01-14

## 2023-01-14 NOTE — Progress Notes (Signed)
[facility]                       Date of Exam: 01/14/2023         Patient ID: [name] [name] is a 67 y.o. male.  Attending Physician: Oswaldo Done, MD        Chief Complaint:    No chief complaint on file.        Assessment / Plan :    1. Iron deficiency anemia due to chronic blood loss  -Chronic will get updated CBC keep appointment with gastroenterology as scheduled.  CBC and differential    2. Vitamin D deficiency  Chronic no need for high-dose weekly vitamin D will goal to daily 2000 units vitamin D  - Vitamin D3 (CHOLECALCIFEROL) 50 MCG (2000 UT) tablet; Take 1 tablet (50 mcg) by mouth daily  Dispense: 90 tablet; Refill: 3    3. Need for vaccination  Patient given prescriptions for hepatitis B and COVID-19 vaccine patient to get this in the pharmacy.  - hepatitis B vaccine, recombinant, (ENERGIX-B) 20 MCG/ML injection; Inject 20 mcg (1 mL) into the muscle once for 1 dose  Dispense: 1 mL; Refill: 0  - COVID-19 mRNA MONOVALENT vaccine PRIMARY SERIES, 12 years and above, [employer]) IM injection; Inject 0.3 mLs (30 mcg) into the muscle once for 1 dose  Dispense: 0.3 mL; Refill: 0    4. Screening for tuberculosis  -Will get screening done.   Quantiferon(R) - TB Gold Plus    5. Screening for prostate cancer  -Will get screening done.   PSA                  HPI:    HPI  Patient is here for follow up. Here with wife [name],  and [place] interpreter Zohail.     Patient reports that he has not had any recurrence of blood in his rectum.  He had seen gastroenterology and had some workup done.  He is supposed to follow-up with them again next month.  He is interested in getting his blood work checked again as he had iron deficiency anemia in the past.    Patient has been doing well.  Denies any chest pains or shortness of breath.  He is right arm pain has healed.  Has been doing cardiac rehab and reports that this will end on May 8.    Patient is seeking asylum from [place] and is  needing to update his requirements for immigration.  They are requiring him to be screened for tuberculosis and also get vaccinations for chronic hepatitis B and COVID-19.  He has had no prior exposure to tuberculosis and no prior treatments for tuberculosis.    He has a history of vitamin D deficiency has completed vitamin D high-dose supplementation.  He requests updated prescription.                The following sections were reviewed this encounter by the provider:   Tobacco  Allergies  Meds  Problems  Med Hx  Surg Hx  Fam Hx             ROS:    Review of Systems   Constitutional:  Negative for chills and fever.   Respiratory:  Negative for shortness of breath.    Cardiovascular:  Negative for chest pain.                Vital Signs:  BP 112/69 (BP Site: Left arm, Patient Position: Sitting, Cuff Size: Medium)   Pulse (!) 58   Temp 97.8 F (36.6 C) (Temporal)   Ht 1.689 m (5' 6.5")   Wt 76.7 kg (169 lb)   SpO2 97%   BMI 26.87 kg/m          Physical Exam:    Physical Exam  Constitutional:       Appearance: Normal appearance.   Cardiovascular:      Rate and Rhythm: Normal rate and regular rhythm.   Pulmonary:      Effort: Pulmonary effort is normal. No respiratory distress.      Breath sounds: Normal breath sounds.   Neurological:      General: No focal deficit present.      Mental Status: He is alert and oriented to person, place, and time.                        Follow-up:    Return in about 6 months (around 07/17/2023) for iron deficiency anemia .         [address] R Cici Rodriges III, MD

## 2023-01-14 NOTE — Progress Notes (Signed)
Have you seen any specialists/other providers since your last visit with [place]?    Yes-Cardiologist       The patient was informed that the following HM items are still outstanding:   Health Maintenance Due   Topic Date Due    Colorectal Cancer Screening  Never done    Advance Directive on File  Never done    Shingrix Vaccine 50+ (1) Never done    Tetanus Ten-Year  Never done    Pneumonia Vaccine Age 69+ (1 of 1 - PCV) Never done    COVID-19 Vaccine (1 - 2023-24 season) Never done

## 2023-01-15 LAB — CBC AND DIFFERENTIAL
Baso(Absolute): 0 10*3/uL (ref 0.0–0.2)
Basophils Automated: 0 %
Eosinophils Absolute: 0.6 10*3/uL — ABNORMAL HIGH (ref 0.0–0.4)
Eosinophils Automated: 10 %
Hematocrit: 43.5 % (ref 37.5–51.0)
Hemoglobin: 14.4 g/dL (ref 13.0–17.7)
Immature Granulocytes Absolute: 0 10*3/uL (ref 0.0–0.1)
Immature Granulocytes: 0 %
Lymphocytes Absolute: 1.9 10*3/uL (ref 0.7–3.1)
Lymphocytes Automated: 30 %
MCH: 30.1 pg (ref 26.6–33.0)
MCHC: 33.1 g/dL (ref 31.5–35.7)
MCV: 91 fL (ref 79–97)
Monocytes Absolute: 0.6 10*3/uL (ref 0.1–0.9)
Monocytes: 9 %
Neutrophils Absolute Count: 3.3 10*3/uL (ref 1.4–7.0)
Neutrophils: 51 %
Platelets: 212 10*3/uL (ref 150–450)
RBC: 4.78 x10E6/uL (ref 4.14–5.80)
RDW: 12.5 % (ref 11.6–15.4)
WBC: 6.4 10*3/uL (ref 3.4–10.8)

## 2023-01-15 LAB — PSA TOTAL

## 2023-01-17 ENCOUNTER — Inpatient Hospital Stay
Admission: RE | Admit: 2023-01-17 | Discharge: 2023-01-17 | Disposition: A | Discharge status: Home / Self Care | Payer: No Typology Code available for payment source | Source: Ambulatory Visit

## 2023-01-17 VITALS — Wt 166.6 lb

## 2023-01-17 DIAGNOSIS — I213 ST elevation (STEMI) myocardial infarction of unspecified site: Secondary | ICD-10-CM

## 2023-01-19 ENCOUNTER — Inpatient Hospital Stay
Admission: RE | Admit: 2023-01-19 | Discharge: 2023-01-19 | Disposition: A | Discharge status: Home / Self Care | Payer: No Typology Code available for payment source | Source: Ambulatory Visit

## 2023-01-19 VITALS — Wt 167.8 lb

## 2023-01-19 DIAGNOSIS — I213 ST elevation (STEMI) myocardial infarction of unspecified site: Secondary | ICD-10-CM

## 2023-01-19 NOTE — Cardiac Rehab ITP (Signed)
Cardiac Rehab Individual Treatment Plan    Referring Provider: Renard Hamper, MD   Comments: Pt graduates from CR program today after completing 36 sessions. Per H&P in Epic, pt has a PMH significant for CAD, diabetes and hyperlipidemia. Pt presented to the hospital on 09/01/2022 with a STEMI and was taken to the Cath Lab where he had a PCI with 3 DES to his Lt circumflex.  Pt returned to the hospital w/ c/o chest pain for which STEMI was activated.  Patient was taken to cardiac catheterization lab on 09/29/2021.  Cardiac catheterization noted mild CAD in the LAD and RCA.  Prior stents were noted to be patent and no intervention was performed.  Cardiology was consulted and recommended continuation of medical management with DAPT, high intensity statin and metoprolol. They cleared for discharge and recommended cardiac rehab outpatient with cardiology follow up.    Assessment Period  Phase: Discharge Assessment  Program : Program Complete  Session #: 64  First Exercise Session (Date): 10/20/22  Referring Diagnosis: STEMI  Date of Event: 09/01/22  Additional Cardiac History: PCI  AACVPR Risk Stratification: Moderate risk  Ejection Fraction: 49%  Ejection Fraction (Test): Echo  Ejection Fraction Study Date: 09/30/2022  Comment: Pt graduates from CR program today after completing 36 sessions. Per H&P in Epic, pt has a PMH significant for CAD, diabetes and hyperlipidemia. Pt presented to the hospital on 09/01/2022 with a STEMI and was taken to the Cath Lab where he had a PCI with 3 DES to his Lt circumflex.  Pt returned to the hospital w/ c/o chest pain for which STEMI was activated.  Patient was taken to cardiac catheterization lab on 09/29/2021.  Cardiac catheterization noted mild CAD in the LAD and RCA.  Prior stents were noted to be patent and no intervention was performed.  Cardiology was consulted and recommended continuation of medical management with DAPT, high intensity statin and metoprolol. They cleared for  discharge and recommended cardiac rehab outpatient with cardiology follow up.    Handout Provided: SMART goal setting, METs and Ejection Fraction, Cardiac risk factors, Blood Pressure, Weather guidelines (hot/cold), Exercising safely at home, Healthy nutrition tips, Cholesterol, Stress Management, Weight Management     Exercise  Assessment: Exercise history 2 weeks prior to cardiac event pre cardiac event, Exercise history 2 weeks pre cardiac rehab/post cardiac event  Work Related Physical Requirements: no/retired  Physical Limitations: No  Where: Walk, Cardiac Rehab  On average, how many days per week do you engage in moderate to strenuous exercise (like a brisk walk)?: 7 days  On average, how many minutes do you engage in exercise at this level?: 50 min  Strength Training: Yes  Interval Training: Yes  Session #3 MET Level: 2.5 (TM)  Current Peak MET Level: 9.6 (TM)  Peak MET Goal: 3.5  MET Level Percentage Increase (%): 284  Adherence: Adheres to current cardiac rehab exercise prescription  Comment: Pt plans to continue his walks/runs in the park everyday for about 50 minutes total. He will do 5 minutes run intervals, he reports his total run time is about 10-15 minutes.     Exercise Prescription/Plan  40% Heart Rate Reserve: 96  80% Heart Rate Reserve: 133  Frequency: Home 7 days/week  RPE Range: 11-15  METS: 9-11 (or as tolerated)  Time: 50+ minutes  Mode: Walking, Jogging  Strength Training: 2-3 days per week, 10-15 reps in 1-3 sets to moderate fatigue, Free weights  Education/Intervention: Provide written information on exercise topics, Educate on the  cardiovascular benefits of exercise, Develop a home exercise plan, Educate on strength training guidelines, Educate on RPE, THR, warm up/cool down, Self monitoring using pulse taking, heart rate monitor, activity tracker, Progress time and intensity when a steady state of HR and RPE occur, Educate on signs/symptoms of cardiovascular compromise, Education &  Intervention complete / Continue to reinforce previous education  Goals: Increase in METS by at least 40%, Cardiovascular exercise 150-300 minutes per week, Strength exercise 2 -3 days per week, Avoid inactivity  Goal(s) Status: Goal met  Comment: d/c ex instructions provided and reviewed with pt, he verbalized his understanding     Nutrition  Assessment: Completed  Height: 170.2 cm (5\' 7" )  Initial Weight (lbs): 170.1lb  Weight (lbs): 167.8lb  BMI (calculated): 26.3  BMI Classification: Overweight BMI 25-29.9  Initial Nutrition Assessment Survey Score : 13  Initial Nutrition Assessment Survey Score Indicates: 7-13: Demonstrates some nutrition strategies to support reduction in CV risk, meet with RDN for MNT  Discharge Nutrition Assessment Survey Score : 14  Discharge Nutrition Assessment Survey Score Indicates: 14-20: Demonstrates nutrition strategies to support reduction in CV risk, may meet with RDN for MNT reinforcement     Nutrition Plan  Current Eating Plan: Unspecified nutrition plan  Fluid Restriction: No fluid restriction  Consult Status: Not Completed  CRP Education/Intervention: Provide written information on heart healthy nutrition  topics, Educate on the role of nutrition on BP control, Educate on the role of nutrition in lipid management, Hydration Guidelines, Educate patient on heart healthy food choices, Educate on the health benefits of weight management, Education & Intervention complete / Continue to reinforce previous education  Goals: Decrease body weight, Modify nutrient intake, Heart healthy eating  Goal(s) Status: Goal partially met  Comment: Pt continues to report eating a lot of fruits and vegetables. He also eats low fat-dairy products and tries to get his family to cook lower-fat meals for dinner. Pt wanted information on low sodium diet, pt provided with educational handout on blood pressure/sodium     Psychosocial  Assessment: Positive coping mechanisms, Adequate support system  In a  typical week, how many times do you talk on the phone with family, friends, or neighbors?: More than three times a week  How often do you get together with friends or relatives?: More than three times a week  How often do you attend church or religious services?: Never  Do you belong to any clubs or organizations such as church groups, unions, fraternal or athletic groups, or school groups?: No  How often do you attend meetings of the clubs or organizations you belong to?: Never  Are you married, widowed, divorced, separated, never married, or living with a partner?: Married  Barriers to Learning: Language  Barriers to Attendance: None  Occupation: Art gallery manager  Work Status: Retired  Initial PHQ-9 Score: 0  Repeat PHQ9 per clinician?: No  Discharge PHQ-9 Score: 0  Level of Depression Severity PHQ-9: None-Minimal 0-4  Initial COOP Quality of Life Survey Score: 15  Discharge COOP Quality of Life Survey Score: 14  Education/Intervention: Provide written information on stress / lifestyle management, Teach and support self-help strategies, Regular physical activity/exercise, Assist patient with identifying stressors, Educate on positive coping mechanisms, Repeat psychosocial survey, Education & Intervention complete / Continue to reinforce previous education  Goals: Adequate support system, Positive coping mechanisms, Improvement in depression,anxiety, social support or health related quality of life, Improvement in PHQ9 survey score  Goal(s) Status: Goal met  Comment: Pt reports having a good  support system with his family who he lives with. He reports no/low stress    Lipid Management  Assessment: HLD  Lipid Profile Collection Date: 09/12/22  Total Cholesterol (mg/dL): 161  HDL (mg/dL): 38  LDL (mg/dL) : 88  Triglycerides (mg/dL): 55  VLDL (mg/dL): 11  Education/Intervention: Provide written information on lipid management, Medication compliance, Recommend RDN consult for MNT, Weight management, Reinforce RDN  recommendations for heart healthy nutrition and lipid management, Education & Intervention complete / Continue to reinforce previous education  Goals: Total Chol <200, LDL (CAD) <70, LDL (non CAD) <100, Women HDL >50 mg/dl - Men HDL >09 mg/dl, Triglycerides <604  Goal(s) Status: Goal met  Comment: No new lipid panel. Pt verbalizes daily compliance with statin therapy     Heart Failure:  Assessment: No Heart Failure    SET-PAD:  Assessment: No SET PAD    Patient Stated Goals  What Matters Most?: "My health" (01/19/2023  5:00 PM)      Patient Stated Goal 1: "to take care of myself and be a healthy person"  Goal 1 Status: Goal met  Comment: Pt reports he reached his goal he "is feeling healthy"  Patient Stated Goal 2: "return to exercise"   Goal 2 Status: Goal met  Comment: Pt is walking and running in the park every day

## 2023-01-19 NOTE — Patient Instructions (Signed)
Cardiac and Pulmonary Rehab Discharge Guidelines    Warm up at a reduced pace for 5 to 10 minutes    Exercise 30-45 minutes at a moderate-vigorous intensity on 5-7 days per week.  Follow the exercise prescription provided by the cardiac or pulmonary rehab clinician.      Exercise at a Rating of Perceived Exertion (RPE) 11-15 see chart below or within your Target Heart Rate 98-134 .      Cool-down at a reduced pace for 5-10 minutes.  This allows your heart rate and blood pressure to return to resting levels.    STRETCH after exercising, HOLD stretches for 15-30 seconds.    Carry water with you and hydrate during exercise.  Water intake recommendation per day is at least 64 oz.  If you are on a fluid restriction, follow your provider's recommendation.    Avoid exercise at temperatures below 40 degrees or above 85 degrees.                        LISTEN to your body. STOP exercise if you feel any symptoms. CALL your MD or 911 to report.            Strength Training    Frequency:  2 -3 times per week on non-consecutive days.    Weight:  Start with the weights you have been doing in cardiac or pulmonary rehab     Intensity:  Repetitions:  The number of consecutive times you lift the weight.                              Start with 8-10 reps.    Sets: The number of times you perform the repetitions.                     Gradually work up to 2 or 3 sets as appropriate for you.           Take approximately a 1-minute break between sets    RPE:  11 - 15     Guidelines:  Breathing: Remember to Exhale on Exertion  Use light weights with high repetitions.   By the end of your set, the last 2 or 3 repetitions should feel slightly more difficult.  Always perform exercises with correct form.    If you feel that your form is not correct, ask an exercise specialist to help you.      Progression:  Once able to complete 15 reps easily, increase the weight by the next available amount.  When you increase the weight, decrease the number of  repetitions back to 8-10.  You may plateau the amount of weight you can lift.  Do not feel pressure to keep increasing the weight.  You will still receive benefit from performing the exercises. To refresh your program, talk with an exercise specialist who can offer new exercises or modalities of strength training.     Safety Considerations  If any new health or orthopedic issues arise, ask your doctor if strength training is still appropriate.  Machines and Free Weights should be clearly marked with their weight.    If you are unsure, Ask an exercise specialist at your gym to help.  -    Different machines may work differently and have different weight values for each plate. (For example, the Cybex weight plate may weigh a different amount tan Hammer Strength, Nautilus, Matrix  machines, etc)

## 2023-01-20 LAB — PSA TOTAL: Prostate Specific Antigen, Total: 3.308 ng/mL

## 2023-01-21 LAB — QUANTIFERON(R) - TB GOLD PLUS
QuantiFERON Mitogen Value: >10 IU/mL
QuantiFERON Nil Value: 0.14 IU/mL
Quantiferon TB Gold Plus: POSITIVE — AB
Quantiferon TB1 Ag Value: 0.57 IU/mL
Quantiferon TB2 Ag Value: 0.62 IU/mL

## 2023-01-21 NOTE — Addendum Note (Signed)
Addended by: Fara Olden on: 01/21/2023 09:00 PM     Modules accepted: Orders

## 2023-01-24 ENCOUNTER — Encounter (INDEPENDENT_AMBULATORY_CARE_PROVIDER_SITE_OTHER): Payer: Self-pay

## 2023-01-26 ENCOUNTER — Ambulatory Visit: Payer: Self-pay

## 2023-01-27 ENCOUNTER — Telehealth (INDEPENDENT_AMBULATORY_CARE_PROVIDER_SITE_OTHER): Payer: Self-pay | Admitting: Family Medicine

## 2023-01-27 ENCOUNTER — Ambulatory Visit: Payer: Self-pay

## 2023-01-27 NOTE — Telephone Encounter (Signed)
Pt called back requesting to know the results of TB test as well as a lab orders to check additional bloodwork.    Pt would like to be called back at [phone]

## 2023-01-27 NOTE — Telephone Encounter (Signed)
Called pt twice left vm to call back to discuss results and gfetting chest xray awaiting pt reply

## 2023-01-31 ENCOUNTER — Other Ambulatory Visit: Payer: No Typology Code available for payment source

## 2023-02-03 ENCOUNTER — Encounter (INDEPENDENT_AMBULATORY_CARE_PROVIDER_SITE_OTHER): Payer: Self-pay | Admitting: Family Medicine

## 2023-02-03 ENCOUNTER — Telehealth (INDEPENDENT_AMBULATORY_CARE_PROVIDER_SITE_OTHER): Payer: No Typology Code available for payment source | Admitting: Family Medicine

## 2023-02-03 DIAGNOSIS — Z227 Latent tuberculosis: Secondary | ICD-10-CM

## 2023-02-03 DIAGNOSIS — I2121 ST elevation (STEMI) myocardial infarction involving left circumflex coronary artery: Secondary | ICD-10-CM

## 2023-02-03 MED ORDER — ISONIAZID 300 MG PO TABS
300.0000 mg | ORAL_TABLET | Freq: Every day | ORAL | 1 refills | Status: DC
Start: 2023-02-03 — End: 2023-02-28

## 2023-02-03 NOTE — Progress Notes (Signed)
[facility]                       Date of Exam: 02/03/2023         Patient ID: [name] [name] is a 67 y.o. male.  Attending Physician: Oswaldo Done, MD        Chief Complaint:    Chief Complaint   Patient presents with    POSITIVE PPD TEST            Verbal consent has been obtained from the patient to conduct a Video visit encounter     Telemedicine Documentation Requirements    Provider and Title: Leron Croak M.D.  Consent obtained: YES/NO: Yes  Language, if applicable and if translator was required: English         HPI:    HPI  Patient is here for follow up. He is here with [name]. Daughter in law [name]    Has positive TB quantiferon test. No night sweats, fever, cough cold or weight loss.   No prior diagnosis of tuberculosis. No prior treatment for latent TB. No known family history of tuberculosis. Moved to [place] from [place] in 2021. Had chest xray earlier which did not show TB findings.     Patient has history of CAD and NSTEMI and is on ticagrelor. No chest pain. Follows cardiology.              Problem List:    Patient Active Problem List   Diagnosis    Uses hearing aid    Bilateral chronic knee pain    STEMI (ST elevation myocardial infarction)    Coronary artery disease             Current Meds:    Outpatient Medications Marked as Taking for the 02/03/23 encounter (Telemedicine Visit) with Nettie Cromwell, Emogene Morgan, MD   Medication Sig Dispense Refill    aspirin EC 81 MG EC tablet Take 1 tablet (81 mg) by mouth daily 90 tablet 3    atorvastatin (LIPITOR) 80 MG tablet Take 1 tablet (80 mg) by mouth nightly 90 tablet 1    metoprolol succinate XL (TOPROL-XL) 25 MG 24 hr tablet Take 1 tablet (25 mg) by mouth daily 90 tablet 3    ticagrelor (BRILINTA) 90 MG Tab Take 1 tablet (90 mg) by mouth every 12 (twelve) hours 180 tablet 3    vitamin D, ergocalciferol, (DRISDOL) 50000 UNIT Cap Take 1 capsule (50,000 Units) by mouth once a week for the next 8 weeks.Please call office  to schedule appointment for further refills 8 capsule 0    Vitamin D3 (CHOLECALCIFEROL) 50 MCG (2000 UT) tablet Take 1 tablet (50 mcg) by mouth daily 90 tablet 3          Allergies:    No Known Allergies                The following sections were reviewed this encounter by the provider:   Tobacco  Allergies  Meds  Problems  Med Hx  Surg Hx  Fam Hx             Vital Signs:    There were no vitals taken for this visit.         ROS:    Review of Systems   Constitutional:  Negative for chills and fever.   Gastrointestinal:  Negative for nausea and vomiting.  Physical Exam:    Physical Exam  Constitutional:       Appearance: Normal appearance.   Eyes:      General: No scleral icterus.  Pulmonary:      Effort: Pulmonary effort is normal. No respiratory distress.   Neurological:      General: No focal deficit present.      Mental Status: He is alert and oriented to person, place, and time.                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 Will treat with isoniazid for 6 months monotherapy  Unable to use rifampin due to drug interactions. - isoniazid (NYDRAZID) 300 MG tablet; Take 1 tablet (300 mg) by mouth daily  Dispense: 90 tablet; Refill: 1  Will need follow up labs in 6 weeks.     2. 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 Has labs ordered CBC BMP and hepatic function panel in 2-3 weeks time.            Follow-up:    Return in about 6 weeks (around 03/17/2023) for latent tuberculosis.         [address] R [name], MD

## 2023-02-08 ENCOUNTER — Other Ambulatory Visit (INDEPENDENT_AMBULATORY_CARE_PROVIDER_SITE_OTHER): Payer: Self-pay | Admitting: Family Medicine

## 2023-02-08 ENCOUNTER — Other Ambulatory Visit (INDEPENDENT_AMBULATORY_CARE_PROVIDER_SITE_OTHER): Payer: Self-pay | Admitting: Cardiology

## 2023-02-08 DIAGNOSIS — E559 Vitamin D deficiency, unspecified: Secondary | ICD-10-CM

## 2023-02-08 DIAGNOSIS — I2510 Atherosclerotic heart disease of native coronary artery without angina pectoris: Secondary | ICD-10-CM

## 2023-02-09 MED ORDER — TICAGRELOR 90 MG PO TABS
90.0000 mg | ORAL_TABLET | Freq: Two times a day (BID) | ORAL | 3 refills | Status: DC
Start: 2023-02-09 — End: 2023-07-14

## 2023-02-09 MED ORDER — ASPIRIN 81 MG PO TBEC
81.0000 mg | DELAYED_RELEASE_TABLET | Freq: Every day | ORAL | 3 refills | Status: DC
Start: 2023-02-09 — End: 2023-03-09

## 2023-02-10 ENCOUNTER — Other Ambulatory Visit (INDEPENDENT_AMBULATORY_CARE_PROVIDER_SITE_OTHER): Payer: Self-pay | Admitting: Family Medicine

## 2023-02-10 DIAGNOSIS — E559 Vitamin D deficiency, unspecified: Secondary | ICD-10-CM

## 2023-02-11 ENCOUNTER — Other Ambulatory Visit (INDEPENDENT_AMBULATORY_CARE_PROVIDER_SITE_OTHER): Payer: Self-pay

## 2023-02-11 ENCOUNTER — Telehealth (INDEPENDENT_AMBULATORY_CARE_PROVIDER_SITE_OTHER): Payer: Self-pay | Admitting: Cardiology

## 2023-02-11 DIAGNOSIS — E559 Vitamin D deficiency, unspecified: Secondary | ICD-10-CM

## 2023-02-11 MED ORDER — ATORVASTATIN CALCIUM 80 MG PO TABS
80.0000 mg | ORAL_TABLET | Freq: Every evening | ORAL | 1 refills | Status: DC
Start: 2023-02-11 — End: 2023-02-24

## 2023-02-11 NOTE — Telephone Encounter (Signed)
Refills have been requested for the following medications:       Vitamin D3 (CHOLECALCIFEROL) 50 MCG (2000 UT) tablet [Jose Leonardo R Sioco III]     Preferred pharmacy: [id number] - [place], [place] - [address], [address], [address] [address]    Please advise. Last prescription was printed but it was requested to be sent to pharmacy.

## 2023-02-11 NOTE — Telephone Encounter (Signed)
Name, strength, directions of requested refill(s):    Atorvastatin (Lipitor) 80 mg tablet    **Patient is out of this medication      Pharmacy to send refill to or patient to pick up rx from office (mark requested pharmacy in [place]):      [id number] - [place], [place] - [address] [facility]  [address]  [address]  [place] [address]  Phone: [phone] Fax: [phone]    [id number] - [place], [place] - [address], [place] [facility]  [address], [place] [address]  Phone: [phone] Fax: [phone]    [id number] [place], [place] - [address]  [address] [place] [address]  Phone: [phone] Fax: [phone]        Patient Preferred Callback Number: [phone]        Next visit: Visit date not found

## 2023-02-11 NOTE — Telephone Encounter (Signed)
Called remaining refills into the pharmacy

## 2023-02-13 MED ORDER — VITAMIN D 50 MCG (2000 UT) PO TABS
50.0000 ug | ORAL_TABLET | Freq: Every day | ORAL | 3 refills | Status: DC
Start: 2023-02-13 — End: 2024-05-16

## 2023-02-16 ENCOUNTER — Other Ambulatory Visit: Payer: No Typology Code available for payment source

## 2023-02-16 DIAGNOSIS — I2510 Atherosclerotic heart disease of native coronary artery without angina pectoris: Secondary | ICD-10-CM

## 2023-02-16 NOTE — Addendum Note (Signed)
Addended by: Cordale Manera RENEE on: 02/16/2023 09:03 AM     Modules accepted: Orders

## 2023-02-16 NOTE — Addendum Note (Signed)
Addended by: Ortencia Kick RENEE on: 02/16/2023 09:03 AM     Modules accepted: Orders

## 2023-02-17 LAB — HEMOLYSIS INDEX(SOFT): Hemolysis Index: 6 Index (ref 0–24)

## 2023-02-17 LAB — CBC AND DIFFERENTIAL
Absolute NRBC: 0 10*3/uL (ref 0.00–0.00)
Basophils Absolute Automated: 0.02 10*3/uL (ref 0.00–0.08)
Basophils Automated: 0.4 %
Eosinophils Absolute Automated: 0.43 10*3/uL (ref 0.00–0.44)
Eosinophils Automated: 7.7 %
Hematocrit: 44 % (ref 37.6–49.6)
Hgb: 14.2 g/dL (ref 12.5–17.1)
Immature Granulocytes Absolute: 0.01 10*3/uL (ref 0.00–0.07)
Immature Granulocytes: 0.2 %
Instrument Absolute Neutrophil Count: 2.85 10*3/uL (ref 1.10–6.33)
Lymphocytes Absolute Automated: 1.8 10*3/uL (ref 0.42–3.22)
Lymphocytes Automated: 32.4 %
MCH: 29.3 pg (ref 25.1–33.5)
MCHC: 32.3 g/dL (ref 31.5–35.8)
MCV: 90.9 fL (ref 78.0–96.0)
MPV: 11.8 fL (ref 8.9–12.5)
Monocytes Absolute Automated: 0.45 10*3/uL (ref 0.21–0.85)
Monocytes: 8.1 %
Neutrophils Absolute: 2.85 10*3/uL (ref 1.10–6.33)
Neutrophils: 51.2 %
Nucleated RBC: 0 /100 WBC (ref 0.0–0.0)
Platelets: 218 10*3/uL (ref 142–346)
RBC: 4.84 10*6/uL (ref 4.20–5.90)
RDW: 13 % (ref 11–15)
WBC: 5.56 10*3/uL (ref 3.10–9.50)

## 2023-02-17 LAB — BASIC METABOLIC PANEL
Anion Gap: 5 (ref 5.0–15.0)
BUN: 12 mg/dL (ref 9.0–28.0)
CO2: 27 mEq/L (ref 17–29)
Calcium: 9.4 mg/dL (ref 8.5–10.5)
Chloride: 109 mEq/L (ref 99–111)
Creatinine: 0.8 mg/dL (ref 0.5–1.5)
Glucose: 97 mg/dL (ref 70–100)
Potassium: 4.5 mEq/L (ref 3.5–5.3)
Sodium: 141 mEq/L (ref 135–145)
eGFR: >60 mL/min/{1.73_m2} (ref >=60)

## 2023-02-17 LAB — HEPATIC FUNCTION PANEL (LFT)
ALT: 35 U/L (ref 0–55)
AST (SGOT): 27 U/L (ref 5–41)
Albumin/Globulin Ratio: 1.4 (ref 0.9–2.2)
Albumin: 4 g/dL (ref 3.5–5.0)
Alkaline Phosphatase: 71 U/L (ref 37–117)
Bilirubin Direct: 0.3 mg/dL (ref 0.0–0.5)
Bilirubin Indirect: 0.4 mg/dL (ref 0.2–1.0)
Bilirubin, Total: 0.7 mg/dL (ref 0.2–1.2)
Globulin: 2.9 g/dL (ref 2.0–3.6)
Protein, Total: 6.9 g/dL (ref 6.0–8.3)

## 2023-02-17 LAB — LIPID PANEL
Cholesterol / HDL Ratio: 2 Index
Cholesterol: 92 mg/dL (ref 0–199)
HDL: 45 mg/dL (ref 40–9999)
LDL Calculated: 38 mg/dL (ref 0–99)
Triglycerides: 47 mg/dL (ref 34–149)
VLDL Calculated: 9 mg/dL — ABNORMAL LOW (ref 10–40)

## 2023-02-24 ENCOUNTER — Ambulatory Visit (INDEPENDENT_AMBULATORY_CARE_PROVIDER_SITE_OTHER): Payer: No Typology Code available for payment source | Admitting: Cardiology

## 2023-02-24 ENCOUNTER — Encounter (INDEPENDENT_AMBULATORY_CARE_PROVIDER_SITE_OTHER): Payer: Self-pay | Admitting: Cardiology

## 2023-02-24 VITALS — BP 117/73 | HR 56 | Temp 97.9°F | Ht 66.0 in | Wt 172.0 lb

## 2023-02-24 DIAGNOSIS — I2510 Atherosclerotic heart disease of native coronary artery without angina pectoris: Secondary | ICD-10-CM

## 2023-02-24 MED ORDER — ATORVASTATIN CALCIUM 40 MG PO TABS
40.0000 mg | ORAL_TABLET | Freq: Every evening | ORAL | 3 refills | Status: DC
Start: 2023-02-24 — End: 2023-03-21

## 2023-02-24 NOTE — Progress Notes (Signed)
[facility] 67 y.o. with a history of coronary artery disease presents for cardiac follow-up of coronary artery disease.    Recently started on isoniazid for positive TB test    Coronary artery disease: Inferior ST segment elevation myocardial infarction (09/11/2022) status post drug-eluting stent placement x 3 to the left circumflex artery 3.5 x 18, 3.5 x 18, 3 x 18 onyx Frontier).  He did undergo repeat cardiac cath (09/29/2022) for recurrent chest pain that noted patent left circumflex stent.  Currently he does have atypical chest pain.  He is compliant with aspirin 81 mg daily, Brilinta 90 mg twice a day, Toprol-XL 25 mg daily and atorvastatin 80 mg nightly.      He denies shortness of breath, lower extreme edema, orthopnea or exertional chest pain.    Most recent cardiac workup is included    Echocardiogram (09/30/2022): Ejection fraction 49%.  Mild mitral regurgitation, tricuspid regurgitation and aortic regurgitation    Cardiac cath (09/11/2022)  LM       Angiographically normal  LAD     40% mid stenosis  LCx      20% proximal stenosis              100% mid stenosis with distal TIMI 0 flow  RCA     20% mid stenosis  LV        LVEF 50%; mid inferior wall hypokinesis; LVEDP 15 mmHg; no significant gradient across aortic valve    Fasting lipid profile (02/16/2023): Total cholesterol 92, HDL 45, LDL 38, triglyceride 47.  Currently on Lipitor 80 mg daily.  Normal LFTs          Current Outpatient Medications   Medication Sig Dispense Refill    aspirin EC 81 MG EC tablet Take 1 tablet (81 mg) by mouth daily 90 tablet 3    isoniazid (NYDRAZID) 300 MG tablet Take 1 tablet (300 mg) by mouth daily 90 tablet 1    metoprolol succinate XL (TOPROL-XL) 25 MG 24 hr tablet Take 1 tablet (25 mg) by mouth daily 90 tablet 3    ticagrelor (BRILINTA) 90 MG Tab Take 1 tablet (90 mg) by mouth every 12 (twelve) hours 180 tablet 3    Vitamin D3 (CHOLECALCIFEROL) 50 MCG (2000 UT) tablet Take 1 tablet (50 mcg) by mouth daily 90 tablet 3     atorvastatin (LIPITOR) 40 MG tablet Take 1 tablet (40 mg) by mouth nightly 90 tablet 3    vitamin D, ergocalciferol, (DRISDOL) 50000 UNIT Cap Take 1 capsule (50,000 Units) by mouth once a week for the next 8 weeks.Please call office to schedule appointment for further refills 8 capsule 0     No current facility-administered medications for this visit.          PE:    Vitals:    02/24/23 1306   BP: 117/73   Pulse: (!) 56   Temp: 97.9 F (36.6 C)   SpO2: 97%     Body mass index is 27.76 kg/m.    Physical Examination: General appearance - alert, well appearing, and in no distress  Mental status - normal mood, behavior, speech, dress, motor activity, and thought processes  Eyes - sclera anicteric  Chest - clear to auscultation, no wheezes, rales or rhonchi, symmetric air entry  Heart - normal rate and regular rhythm, S1 and S2 normal  Abdomen - not examined  Extremities - no pedal edema noted        Labs:  Lipid Panel  Cholesterol   Date/Time Value Ref Range Status   02/16/2023 09:05 AM 92 0 - 199 mg/dL Final   16/06/9603 54:09 AM 189 100 - 199 mg/dL Final     Triglycerides   Date/Time Value Ref Range Status   02/16/2023 09:05 AM 47 34 - 149 mg/dL Final     HDL   Date/Time Value Ref Range Status   02/16/2023 09:05 AM 45 40 - 9,999 mg/dL Final     Comment:     An HDL cholesterol <40 mg/dL is low and constitutes a  coronary heart disease risk factor, and HDL-C>59 mg/dL is  a negative risk factor for CHD.  Ref: American Heart Association; Circulation 2004     08/16/2022 10:30 AM 44 >39 mg/dL Final       CMP:   Sodium   Date/Time Value Ref Range Status   02/16/2023 09:05 AM 141 135 - 145 mEq/L Final     Potassium   Date/Time Value Ref Range Status   02/16/2023 09:05 AM 4.5 3.5 - 5.3 mEq/L Final     Chloride   Date/Time Value Ref Range Status   02/16/2023 09:05 AM 109 99 - 111 mEq/L Final     CO2   Date/Time Value Ref Range Status   02/16/2023 09:05 AM 27 17 - 29 mEq/L Final     Glucose   Date/Time Value Ref Range Status    02/16/2023 09:05 AM 97 70 - 100 mg/dL Final     Comment:     ADA guidelines for diabetes mellitus:  Fasting:  Equal to or greater than 126 mg/dL  Random:   Equal to or greater than 200 mg/dL       BUN   Date/Time Value Ref Range Status   02/16/2023 09:05 AM 12.0 9.0 - 28.0 mg/dL Final     Protein, Total   Date/Time Value Ref Range Status   02/16/2023 09:05 AM 6.9 6.0 - 8.3 g/dL Final     Alkaline Phosphatase   Date/Time Value Ref Range Status   02/16/2023 09:05 AM 71 37 - 117 U/L Final     AST (SGOT)   Date/Time Value Ref Range Status   02/16/2023 09:05 AM 27 5 - 41 U/L Final     ALT   Date/Time Value Ref Range Status   02/16/2023 09:05 AM 35 0 - 55 U/L Final     Anion Gap   Date/Time Value Ref Range Status   02/16/2023 09:05 AM 5.0 5.0 - 15.0 Final     Comment:     Calculated AGAP = Na - (CL + CO2)  Interpret with caution; calculated AGAP may not  reflect patient's true clinical status.  This is a calculated value and platform-dependent.  A value >12.0 has been recommended for the management of  Hyperglycemic Crises: Diabetic Ketoacidosis and Hyperglycemic  Hyperosmolar State. Med Clin [place]. 2017;101(3):587-606.  doi:10.1016/j.mcna.2016.12.011         CBC:   WBC   Date/Time Value Ref Range Status   02/16/2023 09:05 AM 5.56 3.10 - 9.50 x10 3/uL Final     RBC   Date/Time Value Ref Range Status   02/16/2023 09:05 AM 4.84 4.20 - 5.90 x10 6/uL Final     Hemoglobin   Date/Time Value Ref Range Status   01/14/2023 12:00 AM 14.4 13.0 - 17.7 g/dL Final     Hgb   Date/Time Value Ref Range Status   02/16/2023 09:05 AM 14.2 12.5 - 17.1 g/dL Final  Hematocrit   Date/Time Value Ref Range Status   02/16/2023 09:05 AM 44.0 37.6 - 49.6 % Final     MCV   Date/Time Value Ref Range Status   02/16/2023 09:05 AM 90.9 78.0 - 96.0 fL Final     MCHC   Date/Time Value Ref Range Status   02/16/2023 09:05 AM 32.3 31.5 - 35.8 g/dL Final     RDW   Date/Time Value Ref Range Status   02/16/2023 09:05 AM 13 11 - 15 % Final     Platelets    Date/Time Value Ref Range Status   02/16/2023 09:05 AM 218 142 - 346 x10 3/uL Final           Impression / plan    Coronary artery disease: Status post drug-eluting stent placement to the left circumflex x 3.  Continue dual antiplatelet therapy in the form of aspirin 81 mg daily and Brilinta 90 mg every 12 hours.  Continue Toprol-XL 25 mg daily.  His lipids are well-controlled.  Given his well-controlled lipids and the fact that he is also started on isoniazid, decrease atorvastatin 40 mg daily.    Follow-up with cardiology in 6 months    Nelda Bucks, MD  02/24/2023

## 2023-02-28 ENCOUNTER — Other Ambulatory Visit (INDEPENDENT_AMBULATORY_CARE_PROVIDER_SITE_OTHER): Payer: Self-pay | Admitting: Family Medicine

## 2023-02-28 DIAGNOSIS — Z227 Latent tuberculosis: Secondary | ICD-10-CM

## 2023-02-28 MED ORDER — ISONIAZID 300 MG PO TABS
300.0000 mg | ORAL_TABLET | Freq: Every day | ORAL | 4 refills | Status: AC
Start: 2023-02-28 — End: 2023-08-27

## 2023-02-28 NOTE — Telephone Encounter (Signed)
LOV: 01/14/2023  Next Visit 07/22/2023

## 2023-03-09 ENCOUNTER — Other Ambulatory Visit (INDEPENDENT_AMBULATORY_CARE_PROVIDER_SITE_OTHER): Payer: Self-pay | Admitting: Cardiology

## 2023-03-10 MED ORDER — ASPIRIN 81 MG PO TBEC
81.0000 mg | DELAYED_RELEASE_TABLET | Freq: Every day | ORAL | 3 refills | Status: DC
Start: 2023-03-10 — End: 2023-04-11

## 2023-03-21 ENCOUNTER — Other Ambulatory Visit (INDEPENDENT_AMBULATORY_CARE_PROVIDER_SITE_OTHER): Payer: Self-pay | Admitting: Family Medicine

## 2023-03-21 ENCOUNTER — Other Ambulatory Visit (INDEPENDENT_AMBULATORY_CARE_PROVIDER_SITE_OTHER): Payer: Self-pay | Admitting: Cardiology

## 2023-03-21 DIAGNOSIS — E559 Vitamin D deficiency, unspecified: Secondary | ICD-10-CM

## 2023-03-21 MED ORDER — ATORVASTATIN CALCIUM 40 MG PO TABS
40.0000 mg | ORAL_TABLET | Freq: Every evening | ORAL | 3 refills | Status: DC
Start: 2023-03-21 — End: 2023-07-14

## 2023-03-22 NOTE — Telephone Encounter (Signed)
Rx refill 02/13/23

## 2023-03-28 ENCOUNTER — Other Ambulatory Visit (INDEPENDENT_AMBULATORY_CARE_PROVIDER_SITE_OTHER): Payer: Self-pay | Admitting: Cardiology

## 2023-03-28 DIAGNOSIS — I2510 Atherosclerotic heart disease of native coronary artery without angina pectoris: Secondary | ICD-10-CM

## 2023-03-29 MED ORDER — METOPROLOL SUCCINATE ER 25 MG PO TB24
25.0000 mg | ORAL_TABLET | Freq: Every day | ORAL | 3 refills | Status: DC
Start: 2023-03-29 — End: 2023-07-14

## 2023-03-30 ENCOUNTER — Ambulatory Visit (INDEPENDENT_AMBULATORY_CARE_PROVIDER_SITE_OTHER): Payer: No Typology Code available for payment source | Admitting: Family Medicine

## 2023-03-30 ENCOUNTER — Encounter (INDEPENDENT_AMBULATORY_CARE_PROVIDER_SITE_OTHER): Payer: Self-pay | Admitting: Family Medicine

## 2023-03-30 VITALS — BP 114/66 | HR 55 | Temp 98.3°F | Ht 66.5 in | Wt 174.0 lb

## 2023-03-30 DIAGNOSIS — Z227 Latent tuberculosis: Secondary | ICD-10-CM

## 2023-03-30 DIAGNOSIS — E785 Hyperlipidemia, unspecified: Secondary | ICD-10-CM

## 2023-03-30 NOTE — Progress Notes (Signed)
[facility]                       Date of Exam: 03/30/2023         Patient ID: [name] [name] is a 67 y.o. male.  Attending Physician: Oswaldo Done, MD        Chief Complaint:    Chief Complaint   Patient presents with    latent tuberculosis         Assessment / Plan :    1. 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   - Hepatic Function Panel (LFT); Future  - Hepatic Function Panel (LFT)  - CBC with Differential (Order); Future  - CBC with Differential (Order)    2. Dyslipidemia  Chronic, reviewed cardio notes, will continue atorvastatin 40mg  once a day.                     HPI:    HPI  Patient is here for follow up. He is here with wife [name]. We have Dari interpreter Mohammed.     Patient reports that he has been taking isoniazid daily. Has 3 pills left. Has refills in the pharmacy. No night sweats fevers or chills. No side effects with medication. Feeling fine.    Had seen  cardiology earlier, since he has been on isoniazid, his dose of atorvastatin was reduced to 40mg  once a day. Denies chest pain or shortness of breath.                     The following sections were reviewed this encounter by the provider:   Tobacco  Allergies  Meds  Problems  Med Hx  Surg Hx  Fam Hx             ROS:    Review of Systems   Constitutional:  Negative for chills and fever.   Gastrointestinal:  Negative for nausea and vomiting.                Vital Signs:    BP 114/66 (BP Site: Left arm, Patient Position: Sitting, Cuff Size: Medium)   Pulse (!) 55   Temp 98.3 F (36.8 C) (Oral)   Ht 1.689 m (5' 6.5")   Wt 78.9 kg (174 lb)   SpO2 95%   BMI 27.66 kg/m          Physical Exam:    Physical Exam  Constitutional:       Appearance: Normal appearance.   Cardiovascular:      Rate and Rhythm: Normal rate and regular rhythm.   Pulmonary:      Effort: Pulmonary effort is normal. No respiratory  distress.      Breath sounds: Normal breath sounds.   Neurological:      General: No focal deficit present.      Mental Status: He is alert and oriented to person, place, and time.                        Follow-up:    Has appointment in November.         [address] [name] Jissel Slavens III, MD

## 2023-03-30 NOTE — Progress Notes (Signed)
Have you seen any specialists/other providers since your last visit with [place]?    Yes-Cardiologist       The patient was informed that the following HM items are still outstanding:   Health Maintenance Due   Topic Date Due    Colorectal Cancer Screening  Never done    Advance Directive on File  Never done    Shingrix Vaccine 50+ (1) Never done    Tetanus Ten-Year  Never done    Pneumonia Vaccine Age 69+ (1 of 1 - PCV) Never done    COVID-19 Vaccine (1 - 2023-24 season) Never done

## 2023-03-31 LAB — CBC AND DIFFERENTIAL
Baso(Absolute): 0 10*3/uL (ref 0.0–0.2)
Basophils Automated: 1 %
Eosinophils Absolute: 0.4 10*3/uL (ref 0.0–0.4)
Eosinophils Automated: 6 %
Hematocrit: 41.5 % (ref 37.5–51.0)
Hemoglobin: 13.2 g/dL (ref 13.0–17.7)
Immature Granulocytes Absolute: 0 10*3/uL (ref 0.0–0.1)
Immature Granulocytes: 0 %
Lymphocytes Absolute: 1.6 10*3/uL (ref 0.7–3.1)
Lymphocytes Automated: 26 %
MCH: 29.3 pg (ref 26.6–33.0)
MCHC: 31.8 g/dL (ref 31.5–35.7)
MCV: 92 fL (ref 79–97)
Monocytes Absolute: 0.5 10*3/uL (ref 0.1–0.9)
Monocytes: 9 %
Neutrophils Absolute Count: 3.5 10*3/uL (ref 1.4–7.0)
Neutrophils: 58 %
Platelets: 227 10*3/uL (ref 150–450)
RBC: 4.5 x10E6/uL (ref 4.14–5.80)
RDW: 13.4 % (ref 11.6–15.4)
WBC: 6 10*3/uL (ref 3.4–10.8)

## 2023-03-31 LAB — HEPATIC FUNCTION PANEL (LFT)
ALT: 58 IU/L — ABNORMAL HIGH (ref 0–44)
AST (SGOT): 45 IU/L — ABNORMAL HIGH (ref 0–40)
Albumin: 4.2 g/dL (ref 3.9–4.9)
Alkaline Phosphatase: 79 IU/L (ref 44–121)
Bilirubin Direct: 0.15 mg/dL (ref 0.00–0.40)
Bilirubin, Total: 0.5 mg/dL (ref 0.0–1.2)
Protein, Total: 6.8 g/dL (ref 6.0–8.5)

## 2023-04-01 ENCOUNTER — Other Ambulatory Visit (INDEPENDENT_AMBULATORY_CARE_PROVIDER_SITE_OTHER): Payer: Self-pay | Admitting: Family Medicine

## 2023-04-01 DIAGNOSIS — Z227 Latent tuberculosis: Secondary | ICD-10-CM

## 2023-04-02 NOTE — Addendum Note (Signed)
Addended by: Fara Olden on: 04/02/2023 02:56 PM     Modules accepted: Orders

## 2023-04-05 ENCOUNTER — Other Ambulatory Visit (INDEPENDENT_AMBULATORY_CARE_PROVIDER_SITE_OTHER): Payer: Self-pay | Admitting: Family Medicine

## 2023-04-05 DIAGNOSIS — Z227 Latent tuberculosis: Secondary | ICD-10-CM

## 2023-04-06 NOTE — Telephone Encounter (Signed)
Pt has four refills of medication sent mychart message to advise

## 2023-04-06 NOTE — Telephone Encounter (Signed)
Pt has refill of medication advised pt via mychart

## 2023-04-11 ENCOUNTER — Other Ambulatory Visit (INDEPENDENT_AMBULATORY_CARE_PROVIDER_SITE_OTHER): Payer: Self-pay | Admitting: Cardiology

## 2023-04-12 ENCOUNTER — Encounter (INDEPENDENT_AMBULATORY_CARE_PROVIDER_SITE_OTHER): Payer: Self-pay

## 2023-04-12 ENCOUNTER — Encounter (INDEPENDENT_AMBULATORY_CARE_PROVIDER_SITE_OTHER): Payer: Self-pay | Admitting: Family Medicine

## 2023-04-12 MED ORDER — ASPIRIN 81 MG PO TBEC
81.0000 mg | DELAYED_RELEASE_TABLET | Freq: Every day | ORAL | 3 refills | Status: DC
Start: 2023-04-12 — End: 2023-07-14

## 2023-04-12 NOTE — Progress Notes (Signed)
Sent mychart message

## 2023-04-19 ENCOUNTER — Other Ambulatory Visit (INDEPENDENT_AMBULATORY_CARE_PROVIDER_SITE_OTHER): Payer: Self-pay | Admitting: Family Medicine

## 2023-04-19 DIAGNOSIS — E559 Vitamin D deficiency, unspecified: Secondary | ICD-10-CM

## 2023-04-20 ENCOUNTER — Other Ambulatory Visit (INDEPENDENT_AMBULATORY_CARE_PROVIDER_SITE_OTHER): Payer: Self-pay | Admitting: Family Medicine

## 2023-04-20 ENCOUNTER — Other Ambulatory Visit: Payer: No Typology Code available for payment source

## 2023-04-21 LAB — CBC AND DIFFERENTIAL
Baso(Absolute): 0 10*3/uL (ref 0.0–0.2)
Basophils Automated: 0 %
Eosinophils Absolute: 0.2 10*3/uL (ref 0.0–0.4)
Eosinophils Automated: 3 %
Hematocrit: 40.7 % (ref 37.5–51.0)
Hemoglobin: 13.4 g/dL (ref 13.0–17.7)
Immature Granulocytes Absolute: 0 10*3/uL (ref 0.0–0.1)
Immature Granulocytes: 0 %
Lymphocytes Absolute: 1.5 10*3/uL (ref 0.7–3.1)
Lymphocytes Automated: 22 %
MCH: 29.9 pg (ref 26.6–33.0)
MCHC: 32.9 g/dL (ref 31.5–35.7)
MCV: 91 fL (ref 79–97)
Monocytes Absolute: 0.7 10*3/uL (ref 0.1–0.9)
Monocytes: 10 %
Neutrophils Absolute Count: 4.4 10*3/uL (ref 1.4–7.0)
Neutrophils: 65 %
Platelets: 214 10*3/uL (ref 150–450)
RBC: 4.48 x10E6/uL (ref 4.14–5.80)
RDW: 12.8 % (ref 11.6–15.4)
WBC: 6.8 10*3/uL (ref 3.4–10.8)

## 2023-04-21 LAB — HEPATIC FUNCTION PANEL (LFT)
ALT: 45 IU/L — ABNORMAL HIGH (ref 0–44)
AST (SGOT): 40 IU/L (ref 0–40)
Albumin: 4.3 g/dL (ref 3.9–4.9)
Alkaline Phosphatase: 73 IU/L (ref 44–121)
Bilirubin Direct: 0.24 mg/dL (ref 0.00–0.40)
Bilirubin, Total: 0.7 mg/dL (ref 0.0–1.2)
Protein, Total: 6.6 g/dL (ref 6.0–8.5)

## 2023-04-22 NOTE — Telephone Encounter (Signed)
Pt has two additional refills left on medication advised via mychart

## 2023-04-25 ENCOUNTER — Encounter (INDEPENDENT_AMBULATORY_CARE_PROVIDER_SITE_OTHER): Payer: Self-pay | Admitting: Cardiology

## 2023-04-25 ENCOUNTER — Other Ambulatory Visit (INDEPENDENT_AMBULATORY_CARE_PROVIDER_SITE_OTHER): Payer: Self-pay | Admitting: Family Medicine

## 2023-04-25 ENCOUNTER — Other Ambulatory Visit (INDEPENDENT_AMBULATORY_CARE_PROVIDER_SITE_OTHER): Payer: Self-pay | Admitting: Cardiology

## 2023-04-25 DIAGNOSIS — E559 Vitamin D deficiency, unspecified: Secondary | ICD-10-CM

## 2023-04-27 ENCOUNTER — Encounter (INDEPENDENT_AMBULATORY_CARE_PROVIDER_SITE_OTHER): Payer: Self-pay | Admitting: Family Medicine

## 2023-04-29 ENCOUNTER — Ambulatory Visit
Admission: RE | Admit: 2023-04-29 | Discharge: 2023-04-29 | Disposition: A | Discharge status: Home / Self Care | Payer: No Typology Code available for payment source | Source: Ambulatory Visit | Attending: Family Medicine | Admitting: Family Medicine

## 2023-04-29 DIAGNOSIS — R7612 Nonspecific reaction to cell mediated immunity measurement of gamma interferon antigen response without active tuberculosis: Secondary | ICD-10-CM | POA: Insufficient documentation

## 2023-05-03 ENCOUNTER — Encounter (INDEPENDENT_AMBULATORY_CARE_PROVIDER_SITE_OTHER): Payer: Self-pay | Admitting: Family Medicine

## 2023-05-03 ENCOUNTER — Telehealth (INDEPENDENT_AMBULATORY_CARE_PROVIDER_SITE_OTHER): Payer: Self-pay | Admitting: Family Medicine

## 2023-05-03 DIAGNOSIS — Z227 Latent tuberculosis: Secondary | ICD-10-CM

## 2023-05-03 NOTE — Telephone Encounter (Signed)
LVM to schedule patient appointment this afternoon to follow up for possible viral pneumonia - please transfer to front desk, per provider schedule Tuesday 130 or 145pm or OK double book 330pm in person

## 2023-06-06 ENCOUNTER — Emergency Department
Admission: EM | Admit: 2023-06-06 | Discharge: 2023-06-06 | Disposition: A | Discharge status: Home / Self Care | Payer: No Typology Code available for payment source | Attending: Emergency Medicine | Admitting: Emergency Medicine

## 2023-06-06 DIAGNOSIS — S0181XA Laceration without foreign body of other part of head, initial encounter: Secondary | ICD-10-CM | POA: Insufficient documentation

## 2023-06-06 DIAGNOSIS — S0180XA Unspecified open wound of other part of head, initial encounter: Secondary | ICD-10-CM

## 2023-06-06 DIAGNOSIS — W458XXA Other foreign body or object entering through skin, initial encounter: Secondary | ICD-10-CM | POA: Insufficient documentation

## 2023-06-06 MED ORDER — TRANEXAMIC ACID 1000 MG/10 ML TOPICAL SOLN
10.0000 mL | Freq: Once | TOPICAL | Status: AC
Start: 2023-06-06 — End: 2023-06-06
  Administered 2023-06-06: 10 mL via TOPICAL
  Filled 2023-06-06: qty 10

## 2023-06-06 MED ORDER — LIDOCAINE-EPINEPHRINE 1 %-1:100000 IJ SOLN
1.0000 mL | Freq: Once | INTRAMUSCULAR | Status: DC
Start: 2023-06-06 — End: 2023-06-07
  Filled 2023-06-06: qty 20

## 2023-06-06 NOTE — Discharge Instructions (Signed)
We used dermabond to repair your bleeding today. If you develop any new or concerning symptoms then please return to the emergency department. Please avoid picking at the glue, allow it to fall off on its own.

## 2023-06-06 NOTE — ED Triage Notes (Addendum)
Cut to upper lip area while shaving 2 hours ago pressure dressing to area to control bleeding

## 2023-06-06 NOTE — ED Provider Notes (Signed)
EMERGENCY DEPARTMENT NOTE     Patient initially seen and examined at   ED PHYSICIAN ASSIGNED       None           ED MIDLEVEL (APP) ASSIGNED       Date/Time Event User Comments    06/06/23 2213 PA/NP Provider Assigned , Rulon Eisenmenger, FNP assigned as Nurse Practitioner            HISTORY OF PRESENT ILLNESS   Translator Used : No    Chief Complaint: Lip Laceration       67 y.o. male with past medical history as below senting for cut to skin above lip.  Patient states he was shaving when he cut himself.  He was unable to control the bleeding at home.  States he takes Brilinta and aspirin.  States all vaccinations are up-to-date including Tdap.    Independent Historian (other than patient): Family (list in HPI)  Additional History Provided by Independent Historian:  MEDICAL HISTORY     Past Medical History:  Past Medical History:   Diagnosis Date    Abnormal vision     Coronary artery disease     Hearing loss     both ears    Hyperlipidemia     Myocardial infarction     Seasonal allergic rhinitis     Vitamin D deficiency        Past Surgical History:  Past Surgical History[1]    Social History:  Social History[2]    Family History:  Family History[3]    Outpatient Medication:  Discharge Medication List as of 06/06/2023 11:08 PM        CONTINUE these medications which have NOT CHANGED    Details   atorvastatin (LIPITOR) 40 MG tablet Take 1 tablet (40 mg) by mouth nightly, Starting Mon 03/21/2023, Until Thu 03/15/2024, E-Rx      isoniazid (NYDRAZID) 300 MG tablet Take 1 tablet (300 mg) by mouth daily, Starting Mon 02/28/2023, Until Sat 08/27/2023, E-Rx      metoprolol succinate XL (TOPROL-XL) 25 MG 24 hr tablet Take 1 tablet (25 mg) by mouth daily, Starting Tue 03/29/2023, E-Rx      ticagrelor (BRILINTA) 90 MG Tab Take 1 tablet (90 mg) by mouth every 12 (twelve) hours, Starting Wed 02/09/2023, E-Rx      Vitamin D3 (CHOLECALCIFEROL) 50 MCG (2000 UT) tablet Take 1 tablet (50 mcg) by mouth daily, Starting Sun  02/13/2023, E-Rx      aspirin EC 81 MG EC tablet Take 1 tablet (81 mg) by mouth daily, Starting Tue 04/12/2023, Until Fri 04/06/2024, E-Rx      vitamin D, ergocalciferol, (DRISDOL) 50000 UNIT Cap Take 1 capsule (50,000 Units) by mouth once a week for the next 8 weeks.Please call office to schedule appointment for further refills, Starting Sat 12/11/2022, Until Sat 02/05/2023, E-Rx               REVIEW OF SYSTEMS   Review of Systems See History of Present Illness  PHYSICAL EXAM     ED Triage Vitals [06/06/23 2120]   Encounter Vitals Group      BP 122/72      Systolic BP Percentile       Diastolic BP Percentile       Heart Rate 61      Resp Rate 18      Temp 98.2 F (36.8 C)      Temp src Oral  SpO2 96 %      Weight 74 kg      Height       Head Circumference       Peak Flow       Pain Score 0      Pain Loc       Pain Education       Exclude from Growth Chart      Physical Exam  Vitals and nursing note reviewed.   Constitutional:       General: He is not in acute distress.     Appearance: Normal appearance.   HENT:      Head: Normocephalic and atraumatic.        Nose: Nose normal.      Mouth/Throat:      Mouth: Mucous membranes are moist.   Eyes:      Extraocular Movements: Extraocular movements intact.   Pulmonary:      Effort: Pulmonary effort is normal.   Musculoskeletal:      Cervical back: Neck supple.   Skin:     General: Skin is dry.   Neurological:      General: No focal deficit present.      Mental Status: He is alert. Mental status is at baseline.   Psychiatric:         Mood and Affect: Mood normal.          MEDICAL DECISION MAKING     PRIMARY PROBLEM LIST      Acute illness/injury DIAGNOSIS:Skin Avulsion  Chronic Illness Impacting Care of the above problem: Advanced age Increases the risk of poor wound healing  Differential diagnosis including but not limited to avulsion, laceration, abrasion, arterial bleed    DISCUSSION      67 year old male presenting with injury above the lip from shaving.  There is a  very small avulsion continuously oozing.  Unable to stop bleeding despite direct pressure.  Bleeding controlled after 15 minutes of TXA soaked gauze.  Dermabond placed to prevent rebleeding and promote healing.  Has return precautions.  Family and patient participate in plan of care and is agreeable to disposition.    Discussed case with attending, Dr. Yehuda Mao, who is agreeable to plan of care, treatment, and disposition.       External Records Reviewed?: Physician Office Records        Vital Signs: Reviewed the patient's vital signs.   Nursing Notes: Reviewed and utilized available nursing notes.  Medical Records Reviewed: Reviewed available past medical records.  Counseling: The emergency provider has spoken with the patient and discussed today's findings, in addition to providing specific details for the plan of care.  Questions are answered and there is agreement with the plan.      RADIOLOGY IMAGING STUDIES      No orders to display     EMERGENCY DEPT. MEDICATIONS      ED Medication Orders (From admission, onward)      Start Ordered     Status Ordering Provider    06/06/23 2231 06/06/23 2230  lidocaine-EPINEPHrine (XYLOCAINE W/EPI) 1 %-1:100000 injection 1 mL  Once        Route: Intradermal  Ordered Dose: 1 mL       Acknowledged ,  B    06/06/23 2228 06/06/23 2227  tranexamic acid (CYKLOKAPRON) 1000 MG/10 ML solution 10 mL  Once        Route: Topical  Ordered Dose: 10 mL       Last MAR action:  Given ,  B            LABORATORY RESULTS    Ordered and independently interpreted AVAILABLE laboratory tests.   Results       ** No results found for the last 24 hours. **              CRITICAL CARE/PROCEDURES    Procedures    DIAGNOSIS      Diagnosis:  Final diagnoses:   Avulsion of skin of face, initial encounter       Disposition:  ED Disposition       ED Disposition   Discharge    Condition   --    Date/Time   Mon Jun 06, 2023 11:06 PM    Comment   Thurston Hole discharge to home/self  care.    Condition at disposition: Stable                 Prescriptions:  Discharge Medication List as of 06/06/2023 11:08 PM        CONTINUE these medications which have NOT CHANGED    Details   atorvastatin (LIPITOR) 40 MG tablet Take 1 tablet (40 mg) by mouth nightly, Starting Mon 03/21/2023, Until Thu 03/15/2024, E-Rx      isoniazid (NYDRAZID) 300 MG tablet Take 1 tablet (300 mg) by mouth daily, Starting Mon 02/28/2023, Until Sat 08/27/2023, E-Rx      metoprolol succinate XL (TOPROL-XL) 25 MG 24 hr tablet Take 1 tablet (25 mg) by mouth daily, Starting Tue 03/29/2023, E-Rx      ticagrelor (BRILINTA) 90 MG Tab Take 1 tablet (90 mg) by mouth every 12 (twelve) hours, Starting Wed 02/09/2023, E-Rx      Vitamin D3 (CHOLECALCIFEROL) 50 MCG (2000 UT) tablet Take 1 tablet (50 mcg) by mouth daily, Starting Sun 02/13/2023, E-Rx      aspirin EC 81 MG EC tablet Take 1 tablet (81 mg) by mouth daily, Starting Tue 04/12/2023, Until Fri 04/06/2024, E-Rx      vitamin D, ergocalciferol, (DRISDOL) 50000 UNIT Cap Take 1 capsule (50,000 Units) by mouth once a week for the next 8 weeks.Please call office to schedule appointment for further refills, Starting Sat 12/11/2022, Until Sat 02/05/2023, E-Rx                 This note was generated by the Epic EMR system/ Dragon speech recognition and may contain inherent errors or omissions not intended by the user. Grammatical errors, random word insertions, deletions and pronoun errors  are occasional consequences of this technology due to software limitations. Not all errors are caught or corrected. If there are questions or concerns about the content of this note or information contained within the body of this dictation they should be addressed directly with the author for clarification.           [1]   Past Surgical History:  Procedure Laterality Date    LEFT HEART CATH POSS PCI Left 09/11/2022    Procedure: Left Heart Cath Poss PCI;  Surgeon: Nelda Bucks, MD;  Location: [facility];   Service: Cardiovascular;  Laterality: Left;    LEFT HEART CATH POSS PCI Left 09/29/2022    Procedure: Left Heart Cath Poss PCI;  Surgeon: Sande Brothers, MD;  Location: [facility];  Service: Cardiovascular;  Laterality: Left;    NOSE SURGERY     [2]   Social History  Socioeconomic History    Marital status: Married   Occupational History  Occupation: retired     Comment: Engineer, building services   Tobacco Use    Smoking status: Never    Smokeless tobacco: Never   Vaping Use    Vaping status: Never Used   Substance and Sexual Activity    Alcohol use: Not Currently    Drug use: Never   Social History Narrative    Lives with wife, daughter in law and son.     And 2 sons 2 daughter      Social Determinants of Health     Food Insecurity: No Food Insecurity (11/28/2022)    Hunger Vital Sign     Worried About Running Out of Food in the Last Year: Never true     Ran Out of Food in the Last Year: Never true   Transportation Needs: Unknown (09/29/2022)    PRAPARE - Transportation     Lack of Transportation (Medical): No     Lack of Transportation (Non-Medical): Patient unable to answer   Physical Activity: Sufficiently Active (01/19/2023)    Exercise Vital Sign     Days of Exercise per Week: 7 days     Minutes of Exercise per Session: 50 min   Social Connections: Moderately Isolated (01/19/2023)    Social Connection and Isolation Panel [NHANES]     Frequency of Communication with Friends and Family: More than three times a week     Frequency of Social Gatherings with Friends and Family: More than three times a week     Attends Religious Services: Never     Database administrator or Organizations: No     Attends Banker Meetings: Never     Marital Status: Married   Catering manager Violence: Not At Risk (11/28/2022)    Humiliation, Afraid, Rape, and Kick questionnaire     Fear of Current or Ex-Partner: No     Emotionally Abused: No     Physically Abused: No     Sexually Abused: No   Housing Stability: Low Risk   (09/12/2022)    Housing Stability Vital Sign     Unable to Pay for Housing in the Last Year: No     Number of Places Lived in the Last Year: 1     Unstable Housing in the Last Year: No   [3] No family history on file.       Lennette Bihari, FNP  06/07/23 [phone]

## 2023-06-06 NOTE — EDIE (Signed)
[facility] NOTIFICATION 06/06/2023 21:15 [place], Robson DOB: 06-09-1956 MRN: [id number]    [facility]'s patient encounter information:   VWU:[id number]  Account [id number]  Billing Account [id number]      Criteria Met      5 ED Visits in 12 Months    Security and Safety  No Security Events were found.  ED Care Guidelines  There are currently no ED Care Guidelines for this patient. Please check your facility's medical records system.        Prescription Monitoring Program  Narx Score not available at this time.    E.D. Visit Count (12 mo.)  Facility Visits   [facility] 5   [facility] 1   Total 6   Note: Visits indicate total known visits.     Recent Emergency Department Visit Summary  Date Facility [facility] Type Diagnoses or Chief Complaint    Jun 06, 2023  [facility]  [place]  [place]  Emergency      Lip Laceration      Nov 28, 2022  [facility]  [place]  [place]  Emergency      Other injury of unspecified body region, initial encounter      Laceration      lip injury      Sep 29, 2022  [facility]  [place]  [place]  Emergency      ST elevation (STEMI) myocardial infarction of unspecified site      cath lab      Sep 29, 2022  [facility]  [place]  [place]  Emergency      ST elevation (STEMI) myocardial infarction of unspecified site      Chest Pain      Chest Pains      Sep 11, 2022  [facility]  [place]  [place]  Emergency      ST elevation (STEMI) myocardial infarction of unspecified site      shortness of breath, chest pain      Aug 11, 2022  [facility]  [place]  [place]  Emergency      Acute bronchospasm      Acute bronchiolitis due to respiratory syncytial virus      Contact with and (suspected) exposure to COVID-19      URI      cough        Recent Inpatient Visit Summary  Date Facility [facility] Type Diagnoses or Chief Complaint    Sep 29, 2022  [place] [facility]  [place]    Medical Surgical       ST elevation (STEMI) myocardial infarction of unspecified site      Sep 11, 2022  [place] [facility] [facility]  [place]    Cardiology      Vitamin D deficiency, unspecified      ST elevation (STEMI) myocardial infarction of unspecified site        Care Team  No Care Team was found.  [facility]  This patient has registered at the [facility] Emergency Department  For more information visit: [email or web address]     PLEASE NOTE:     1.   Any care recommendations and other clinical information are provided as guidelines or for historical purposes only, and providers should exercise their own clinical judgment when providing care.    2.   You may  only use this information for purposes of treatment, payment or health care operations activities, and subject to the limitations of applicable [facility] Policies.    3.   You should consult directly with the organization that provided a care guideline or other clinical history with any questions about additional information or accuracy or completeness of information provided.    ? 2024 [facility] - [email or web address]

## 2023-06-13 NOTE — ED Notes (Signed)
EMERGENCY DEPARTMENT ATTENDING PHYSICIAN NOTE     I performed the substantive portion of the visit. Medical decision-making: For the problems addressed, I personally developed, reviewed, and/or approved the plan and assessment as documented by the APP. Patient was seen by APP, see their note for documentation of history and exam.    BRIEF HISTORY OF PRESENT ILLNESS AND ADDITIONAL EXAM FINDINGS     Chief Complaint: Lip Laceration       History per APP    66 y.o. male with past medical history as below senting for cut to skin above lip.  Patient states he was shaving when he cut himself.  He was unable to control the bleeding at home.  States he takes Brilinta and aspirin.  States all vaccinations are up-to-date including Tdap.        Triage Vitals:  ED Triage Vitals [06/06/23 2120]   Encounter Vitals Group      BP 122/72      Systolic BP Percentile       Diastolic BP Percentile       Heart Rate 61      Resp Rate 18      Temp 98.2 F (36.8 C)      Temp src Oral      SpO2 96 %      Weight 74 kg      Height       Head Circumference       Peak Flow       Pain Score 0      Pain Loc       Pain Education       Exclude from [facility] Chart             MEDICAL DECISION MAKING   Discussed presentation, workup, results, and plan with the Advanced Practice Provider.  Patient stable for discharge with outpatient treatment plan and close follow up.        Vital Signs: Reviewed the patient's vital signs.   Nursing Notes: Reviewed and utilized available nursing notes.  Medical Records Reviewed: Reviewed available past medical records.    CARDIAC STUDIES    The following cardiac studies were independently interpreted by me the Emergency Medicine Provider.  For full cardiac study results please see chart. I discussed testing results with the APP.              EMERGENCY IMAGING STUDIES    The following imagine studies were independently interpreted by me (emergency medicine provider). I discussed testing results with the APP.                      RADIOLOGY IMAGING STUDIES      No orders to display       EMERGENCY DEPT. MEDICATIONS      ED Medication Orders (From admission, onward)      Start Ordered     Status Ordering Provider    06/06/23 2231 06/06/23 2230    Once        Route: Intradermal  Ordered Dose: 1 mL       Discontinued RATLIFF, SHEILA B    06/06/23 2228 06/06/23 2227  tranexamic acid (CYKLOKAPRON) 1000 MG/10 ML solution 10 mL  Once        Route: Topical  Ordered Dose: 10 mL       Last MAR action: Given RATLIFF, SHEILA B            LABORATORY RESULTS    Ordered  and independently interpreted AVAILABLE laboratory tests.   Results       ** No results found for the last 24 hours. **              CRITICAL CARE/PROCEDURES        DIAGNOSIS    I (ED Physician) discussed final disposition with the APP    Diagnosis:  Final diagnoses:   Avulsion of skin of face, initial encounter       Disposition:  ED Disposition       ED Disposition   Discharge    Condition   --    Date/Time   Mon Jun 06, 2023 11:06 PM    Comment   Thurston Hole discharge to home/self care.    Condition at disposition: Stable                 Prescriptions:  Discharge Medication List as of 06/06/2023 11:08 PM        CONTINUE these medications which have NOT CHANGED    Details   aspirin EC 81 MG EC tablet Take 1 tablet (81 mg) by mouth daily, Starting Tue 04/12/2023, Until Fri 04/06/2024, E-Rx      atorvastatin (LIPITOR) 40 MG tablet Take 1 tablet (40 mg) by mouth nightly, Starting Mon 03/21/2023, Until Thu 03/15/2024, E-Rx      isoniazid (NYDRAZID) 300 MG tablet Take 1 tablet (300 mg) by mouth daily, Starting Mon 02/28/2023, Until Sat 08/27/2023, E-Rx      metoprolol succinate XL (TOPROL-XL) 25 MG 24 hr tablet Take 1 tablet (25 mg) by mouth daily, Starting Tue 03/29/2023, E-Rx      ticagrelor (BRILINTA) 90 MG Tab Take 1 tablet (90 mg) by mouth every 12 (twelve) hours, Starting Wed 02/09/2023, E-Rx      vitamin D, ergocalciferol, (DRISDOL) 50000 UNIT Cap Take 1 capsule (50,000 Units) by  mouth once a week for the next 8 weeks.Please call office to schedule appointment for further refills, Starting Sat 12/11/2022, Until Sat 02/05/2023, E-Rx      Vitamin D3 (CHOLECALCIFEROL) 50 MCG (2000 UT) tablet Take 1 tablet (50 mcg) by mouth daily, Starting Sun 02/13/2023, E-Rx                Ronnie Derby, MD  06/13/23 1520

## 2023-07-12 ENCOUNTER — Ambulatory Visit (INDEPENDENT_AMBULATORY_CARE_PROVIDER_SITE_OTHER): Payer: No Typology Code available for payment source | Admitting: Cardiology

## 2023-07-14 ENCOUNTER — Encounter (INDEPENDENT_AMBULATORY_CARE_PROVIDER_SITE_OTHER): Payer: Self-pay | Admitting: Cardiology

## 2023-07-14 ENCOUNTER — Ambulatory Visit (INDEPENDENT_AMBULATORY_CARE_PROVIDER_SITE_OTHER): Payer: No Typology Code available for payment source | Admitting: Cardiology

## 2023-07-14 VITALS — BP 110/68 | HR 54 | Temp 97.7°F | Ht 66.0 in | Wt 175.0 lb

## 2023-07-14 DIAGNOSIS — I2510 Atherosclerotic heart disease of native coronary artery without angina pectoris: Secondary | ICD-10-CM

## 2023-07-14 LAB — ECG 12-LEAD
Atrial Rate: 55 {beats}/min
P Axis: 71 degrees
P-R Interval: 188 ms
Q-T Interval: 396 ms
QRS Duration: 82 ms
QTC Calculation (Bezet): 378 ms
R Axis: -6 degrees
T Axis: -17 degrees
Ventricular Rate: 55 {beats}/min

## 2023-07-14 MED ORDER — ASPIRIN 81 MG PO TBEC
81.0000 mg | DELAYED_RELEASE_TABLET | Freq: Every day | ORAL | 3 refills | Status: DC
Start: 2023-07-14 — End: 2024-05-02

## 2023-07-14 MED ORDER — TICAGRELOR 90 MG PO TABS
90.0000 mg | ORAL_TABLET | Freq: Two times a day (BID) | ORAL | 3 refills | Status: DC
Start: 2023-07-14 — End: 2023-07-14

## 2023-07-14 MED ORDER — METOPROLOL SUCCINATE ER 25 MG PO TB24
25.0000 mg | ORAL_TABLET | Freq: Every day | ORAL | 3 refills | Status: DC
Start: 2023-07-14 — End: 2024-05-02

## 2023-07-14 MED ORDER — ATORVASTATIN CALCIUM 40 MG PO TABS
40.0000 mg | ORAL_TABLET | Freq: Every evening | ORAL | 3 refills | Status: DC
Start: 2023-07-14 — End: 2024-05-02

## 2023-07-14 NOTE — Progress Notes (Signed)
 [facility] 67 y.o. with a history of coronary artery disease presents for cardiac follow-up of coronary artery disease.    Since last office visit, he has had 2 ER visits for bleeding.  Lip laceration required ER visit.  Of note he is on Brilinta 90 mg

## 2023-07-19 ENCOUNTER — Encounter (INDEPENDENT_AMBULATORY_CARE_PROVIDER_SITE_OTHER): Payer: Self-pay | Admitting: Family Medicine

## 2023-07-19 ENCOUNTER — Telehealth (INDEPENDENT_AMBULATORY_CARE_PROVIDER_SITE_OTHER): Payer: Self-pay | Admitting: Family Medicine

## 2023-07-19 NOTE — Telephone Encounter (Signed)
*  lvm to reschedule 07/22/23 appt. Provider will be OOO [GA 07/19/23]

## 2023-07-22 ENCOUNTER — Ambulatory Visit (INDEPENDENT_AMBULATORY_CARE_PROVIDER_SITE_OTHER): Payer: No Typology Code available for payment source | Admitting: Family Medicine

## 2023-07-27 ENCOUNTER — Encounter (INDEPENDENT_AMBULATORY_CARE_PROVIDER_SITE_OTHER): Payer: Self-pay | Admitting: Family Medicine

## 2023-07-27 ENCOUNTER — Telehealth (INDEPENDENT_AMBULATORY_CARE_PROVIDER_SITE_OTHER): Payer: No Typology Code available for payment source | Admitting: Family Medicine

## 2023-07-27 DIAGNOSIS — R051 Acute cough: Secondary | ICD-10-CM

## 2023-07-27 DIAGNOSIS — R9389 Abnormal findings on diagnostic imaging of other specified body structures: Secondary | ICD-10-CM

## 2023-07-27 DIAGNOSIS — Z227 Latent tuberculosis: Secondary | ICD-10-CM

## 2023-07-27 MED ORDER — BENZONATATE 100 MG PO CAPS
100.0000 mg | ORAL_CAPSULE | Freq: Three times a day (TID) | ORAL | 0 refills | Status: DC | PRN
Start: 2023-07-27 — End: 2023-10-12

## 2023-07-27 NOTE — Progress Notes (Signed)
 Have you seen any specialists/other providers since your last visit with [place]?    No      The patient was informed that the following HM items are still outstanding:   Health Maintenance Due   Topic Date Due    Colorectal Cancer Screening  Never done    Adva

## 2023-07-27 NOTE — Progress Notes (Signed)
 [facility]                       Date of Exam: 07/27/2023         Patient ID: [name] [name] is a 67 y.o. male.  Attending Physician: Oswaldo Done, MD        Chief Complaint:    Chief Complaint   Patient presents wit

## 2023-08-03 ENCOUNTER — Ambulatory Visit (INDEPENDENT_AMBULATORY_CARE_PROVIDER_SITE_OTHER): Payer: No Typology Code available for payment source | Admitting: Family Medicine

## 2023-08-03 ENCOUNTER — Encounter (INDEPENDENT_AMBULATORY_CARE_PROVIDER_SITE_OTHER): Payer: Self-pay | Admitting: Family Medicine

## 2023-08-03 VITALS — BP 123/70 | HR 57 | Temp 97.8°F | Ht 66.0 in | Wt 173.6 lb

## 2023-08-03 DIAGNOSIS — D500 Iron deficiency anemia secondary to blood loss (chronic): Secondary | ICD-10-CM

## 2023-08-03 DIAGNOSIS — R9389 Abnormal findings on diagnostic imaging of other specified body structures: Secondary | ICD-10-CM

## 2023-08-03 DIAGNOSIS — Z227 Latent tuberculosis: Secondary | ICD-10-CM

## 2023-08-03 DIAGNOSIS — I2510 Atherosclerotic heart disease of native coronary artery without angina pectoris: Secondary | ICD-10-CM

## 2023-08-03 NOTE — Progress Notes (Signed)
 Have you seen any specialists/other providers since your last visit with [place]?    No      The patient was informed that the following HM items are still outstanding:   Health Maintenance Due   Topic Date Due    Colorectal Cancer Screening  Never done    Adva

## 2023-08-03 NOTE — Progress Notes (Signed)
 [facility]                       Date of Exam: 08/03/2023         Patient ID: Maks Cavallero is a 67 y.o. male.  Attending Physician: Oswaldo Done, MD        Chief Complaint:    Chief Complaint   Patient presents wit

## 2023-08-04 ENCOUNTER — Ambulatory Visit
Admission: RE | Admit: 2023-08-04 | Discharge: 2023-08-04 | Disposition: A | Discharge status: Home / Self Care | Payer: No Typology Code available for payment source | Source: Ambulatory Visit | Attending: Family Medicine | Admitting: Family Medicine

## 2023-08-04 DIAGNOSIS — R9389 Abnormal findings on diagnostic imaging of other specified body structures: Secondary | ICD-10-CM | POA: Insufficient documentation

## 2023-08-04 LAB — CBC AND DIFFERENTIAL
Baso(Absolute): 0 10*3/uL (ref 0.0–0.2)
Basophils Automated: 1 %
Eosinophils Absolute: 0.4 10*3/uL (ref 0.0–0.4)
Eosinophils Automated: 7 %
Hematocrit: 43.4 % (ref 37.5–51.0)
Hemoglobin: 13.9 g/dL (ref 13.0–17.7)
Immature Granulocytes Absolute: 0 10*3/uL (ref 0.0–0.1)
Immature Granulocytes: 0 %
Lymphocytes Absolute: 2.2 10*3/uL (ref 0.7–3.1)
Lymphocytes Automated: 36 %
MCH: 29.6 pg (ref 26.6–33.0)
MCHC: 32 g/dL (ref 31.5–35.7)
MCV: 92 fL (ref 79–97)
Monocytes Absolute: 0.5 10*3/uL (ref 0.1–0.9)
Monocytes: 9 %
Neutrophils Absolute Count: 2.9 10*3/uL (ref 1.4–7.0)
Neutrophils: 47 %
Platelets: 222 10*3/uL (ref 150–450)
RBC: 4.7 x10E6/uL (ref 4.14–5.80)
RDW: 12.3 % (ref 11.6–15.4)
WBC: 6 10*3/uL (ref 3.4–10.8)

## 2023-08-04 LAB — COMPREHENSIVE METABOLIC PANEL
ALT: 29 [IU]/L (ref 0–44)
AST (SGOT): 23 [IU]/L (ref 0–40)
Albumin: 4.5 g/dL (ref 3.9–4.9)
Alkaline Phosphatase: 79 [IU]/L (ref 44–121)
BUN / Creatinine Ratio: 19 (ref 10–24)
BUN: 14 mg/dL (ref 8–27)
Bilirubin, Total: 0.5 mg/dL (ref 0.0–1.2)
CO2: 22 mmol/L (ref 20–29)
Calcium: 9.3 mg/dL (ref 8.6–10.2)
Chloride: 103 mmol/L (ref 96–106)
Creatinine: 0.74 mg/dL — ABNORMAL LOW (ref 0.76–1.27)
Globulin, Total: 2.6 g/dL (ref 1.5–4.5)
Glucose: 94 mg/dL (ref 70–99)
Potassium: 4.2 mmol/L (ref 3.5–5.2)
Protein, Total: 7.1 g/dL (ref 6.0–8.5)
Sodium: 140 mmol/L (ref 134–144)
eGFR: 98 mL/min/{1.73_m2} (ref >59)

## 2023-08-19 ENCOUNTER — Ambulatory Visit (INDEPENDENT_AMBULATORY_CARE_PROVIDER_SITE_OTHER): Payer: No Typology Code available for payment source | Admitting: Family Medicine

## 2023-10-12 ENCOUNTER — Ambulatory Visit (INDEPENDENT_AMBULATORY_CARE_PROVIDER_SITE_OTHER): Payer: No Typology Code available for payment source | Admitting: Family Medicine

## 2023-10-12 ENCOUNTER — Encounter (INDEPENDENT_AMBULATORY_CARE_PROVIDER_SITE_OTHER): Payer: Self-pay | Admitting: Family Medicine

## 2023-10-12 VITALS — BP 110/71 | HR 61 | Temp 97.8°F | Ht 66.0 in | Wt 179.0 lb

## 2023-10-12 DIAGNOSIS — Z1211 Encounter for screening for malignant neoplasm of colon: Secondary | ICD-10-CM

## 2023-10-12 DIAGNOSIS — R053 Chronic cough: Secondary | ICD-10-CM

## 2023-10-12 DIAGNOSIS — Z0000 Encounter for general adult medical examination without abnormal findings: Secondary | ICD-10-CM

## 2023-10-12 DIAGNOSIS — R0989 Other specified symptoms and signs involving the circulatory and respiratory systems: Secondary | ICD-10-CM

## 2023-10-12 DIAGNOSIS — Z1212 Encounter for screening for malignant neoplasm of rectum: Secondary | ICD-10-CM

## 2023-10-12 DIAGNOSIS — I4729 Other ventricular tachycardia: Secondary | ICD-10-CM

## 2023-10-12 DIAGNOSIS — R972 Elevated prostate specific antigen [PSA]: Secondary | ICD-10-CM

## 2023-10-12 DIAGNOSIS — I2510 Atherosclerotic heart disease of native coronary artery without angina pectoris: Secondary | ICD-10-CM

## 2023-10-12 DIAGNOSIS — Z8615 Personal history of latent tuberculosis infection: Secondary | ICD-10-CM

## 2023-10-12 DIAGNOSIS — H903 Sensorineural hearing loss, bilateral: Secondary | ICD-10-CM

## 2023-10-12 MED ORDER — BENZONATATE 100 MG PO CAPS
100.0000 mg | ORAL_CAPSULE | Freq: Three times a day (TID) | ORAL | 0 refills | Status: DC | PRN
Start: 2023-10-12 — End: 2023-11-02

## 2023-10-12 NOTE — Progress Notes (Signed)
 Have you seen any specialists/other providers since your last visit with us ?    No      The patient was informed that the following HM items are still outstanding:   Health Maintenance Due   Topic Date Due    Colorectal Cancer Screening  Never done    Advance Directive on File  Never done    INFLUENZA VACCINE  04/14/2023    Shingrix Vaccine 50+ (2) 04/19/2023    COVID-19 Vaccine (1 - 2024-25 season) Never done    Annual Exam  10/12/2023

## 2023-10-12 NOTE — Progress Notes (Signed)
 [facility]                       Date of Exam: 10/12/2023         Patient ID: [name] [name] is a 68 y.o. male.  Attending Physician: Aloysius Dan JONELLE Ree DOUGLAS, MD        Chief Complaint:    Chief Complaint   Patient presents with    Annual Exam     Non-fasting                   HPI:    Visit Type: Preventative Visit    Patient is here for his annual exam. Here with wife [name]. We have Dari interpreter via video.     Reports of cough for the past week. Has some wheezing at night. No shortness of breath or chest pain. No fevers. No runny nose currently. Has hesitation to get another chest xray as symptoms are not severe. Had completion of treatment for latent TB.     Wife reports that patient has had recurrent coughing spells, thinks that this was related to weather changes and has not been happening longer than a week from now. We had given him tessalon  but never took the medication as cough got better, but now has been coughing again.     Has history of CAD, follows cardiology every 6 months. Last xray showed mild vascular congestions. Denies chest pains and shortness of breath.     Would like to establish with ENT, has hearing loss and is using hearing aids.     Has not had colonoscopy with GI. Had declined in the past.     Reported Health: good health  Interval Events: none    Work Status: retired  Diet: balanced diet   Exercise: occasionally, once a week because of cold weather.     Dental: regular dental visits twice a year  Vision: regular eye exams   Hearing: hearing aid    Reproductive Health: not currently sexually active  STD concerns: denies knowledge of risky exposure  Other screening tests: last PSA 1 year ago  General Health Risks: no family history of prostate cancer and no family history of colon cancer    Abdominal Aortic Aneurysm Screening: Not Available  Hepatitis C Screening: No components found for: HEPCAB     Immunization History   Administered Date(s) Administered     COVID-19 mRNA seasonal vaccine 12 years and above [employer]) 30 mcg/0.3 mL 01/22/2023    Hepatitis B vaccine (recombinant), CpG adjuvanted 02/17/2023    Influenza vaccine, quadrivalent, 6 months and older (AFLURIA/FLUARIX /FLULAVAL /FLUZONE), single-dose preservative free, 0.5 mL 10/11/2022    PNEUMOCOCCAL CONJUGATE 20-VALENT PF 02/17/2023    Zoster (SHINGRIX) vaccine, recombinant 01/21/2023       There are no preventive care reminders to display for this patient.              Problem List:    Problem List[1]          Current Meds:    Medications Taking[2]       Allergies:    Allergies[3]          Past Surgical History:    Past Surgical History[4]        Family History:    Family History[5]        Social History:    Social History[6]        The following sections were reviewed this encounter  by the provider:   Tobacco  Allergies  Meds  Problems  Med Hx  Surg Hx  Fam Hx             ROS:    Review of Systems   Constitutional:  Negative for chills and fever.   Gastrointestinal:  Negative for nausea and vomiting.                Vital Signs:    BP 110/71 (BP Site: Left arm, Patient Position: Sitting, Cuff Size: X-Large)   Pulse 61   Temp 97.8 F (36.6 C) (Oral)   Ht 1.676 m (5' 6)   Wt 81.2 kg (179 lb)   SpO2 98%   BMI 28.89 kg/m          Physical Exam:    Physical Exam  Constitutional:       Appearance: Normal appearance.   HENT:      Right Ear: Tympanic membrane normal.      Left Ear: Tympanic membrane normal.      Ears:      Comments: Uses hearing aids     Mouth/Throat:      Mouth: Mucous membranes are moist.   Eyes:      Extraocular Movements: Extraocular movements intact.   Cardiovascular:      Rate and Rhythm: Normal rate and regular rhythm.   Pulmonary:      Effort: Pulmonary effort is normal. No respiratory distress.      Breath sounds: Normal breath sounds.   Abdominal:      General: Bowel sounds are normal.      Tenderness: There is no abdominal tenderness.   Musculoskeletal:      Right lower  leg: No edema.      Left lower leg: No edema.   Neurological:      General: No focal deficit present.      Mental Status: He is alert and oriented to person, place, and time.   Psychiatric:         Mood and Affect: Mood normal.                    Assessment / Plan:    Assessment & Plan  Well adult exam  Discussed about diet and exercise.   Encouraged to get flu, shingles vaccine.        Pulmonary congestion  Reviewed xrays, mild pulmonary congestion. Lungs clear on exam. No edema.   Offered repeat chest xrays, patient declines. Monitor for now.        History of latent tuberculosis  Completed treatment, family reports that immigration forms have been approved.        Chronic cough  Chronic, persistent intermittent, not controlled, patient declines repeat chest xray pulmonary congestion. Will refer to pulmonology for 2nd opinion. They request new prescription for tessalon .   Orders:    Referral to Pulmonology ([place]); Future    benzonatate  (TESSALON ) 100 MG capsule; Take 1 capsule (100 mg) by mouth 3 (three) times daily as needed for Cough    Follow Up In Primary Care; Future    Bilateral sensorineural hearing loss  Chronic, will send ENT referral.   Orders:    Ambulatory referral to ENT; Future    Elevated PSA  Trending PSA levels, will recheck.   Orders:    PSA Total; Future    NSVT (nonsustained ventricular tachycardia)  Chronic, stable. Follows cardiology.        Coronary artery disease involving native  coronary artery of native heart without angina pectoris  Chronic, stable. On aspirin .   Orders:    CBC with Differential (Order); Future    Comprehensive Metabolic Panel; Future    Lipid Panel; Future    Follow Up In Primary Care; Future    Encounter for colorectal cancer screening  Will get screening done.    Orders:    Stool Occult Blood, Fecal Immunochemical Test (FIT); Future                        Follow-up:    Follow up in 6 months.         Adalea Handler Leonardo R Ezequiel Macauley III, MD              [1]   Patient Active  Problem List  Diagnosis    Uses hearing aid    Bilateral chronic knee pain    Coronary artery disease    NSVT (nonsustained ventricular tachycardia)    Bilateral sensorineural hearing loss   [2]   Outpatient Medications Marked as Taking for the 10/12/23 encounter (Office Visit) with Rainna Nearhood, Aloysius Dan JONELLE DOUGLAS, MD   Medication Sig Dispense Refill    aspirin  EC 81 MG EC tablet Take 1 tablet (81 mg) by mouth daily 90 tablet 3    atorvastatin  (LIPITOR) 40 MG tablet Take 1 tablet (40 mg) by mouth nightly 90 tablet 3    benzonatate  (TESSALON ) 100 MG capsule Take 1 capsule (100 mg) by mouth 3 (three) times daily as needed for Cough 30 capsule 0    metoprolol  succinate XL (TOPROL -XL) 25 MG 24 hr tablet Take 1 tablet (25 mg) by mouth daily 90 tablet 3    Vitamin D3 (CHOLECALCIFEROL) 50 MCG (2000 UT) tablet Take 1 tablet (50 mcg) by mouth daily 90 tablet 3   [3] No Known Allergies  [4]   Past Surgical History:  Procedure Laterality Date    LEFT HEART CATH POSS PCI Left 09/11/2022    Procedure: Left Heart Cath Poss PCI;  Surgeon: Claudene Katheren BROCKS, MD;  Location: [facility];  Service: Cardiovascular;  Laterality: Left;    LEFT HEART CATH POSS PCI Left 09/29/2022    Procedure: Left Heart Cath Poss PCI;  Surgeon: Garry CHRISTELLA Better, MD;  Location: [facility];  Service: Cardiovascular;  Laterality: Left;    NOSE SURGERY     [5] No family history on file.  [6]   Social History  Tobacco Use    Smoking status: Never    Smokeless tobacco: Never   Vaping Use    Vaping status: Never Used   Substance Use Topics    Alcohol use: Not Currently    Drug use: Never

## 2023-10-13 ENCOUNTER — Other Ambulatory Visit: Payer: No Typology Code available for payment source

## 2023-10-14 LAB — CBC AND DIFFERENTIAL
Baso(Absolute): 0 10*3/uL (ref 0.0–0.2)
Basophils Automated: 1 %
Eosinophils Absolute: 0.3 10*3/uL (ref 0.0–0.4)
Eosinophils Automated: 7 %
Hematocrit: 41.9 % (ref 37.5–51.0)
Hemoglobin: 13.1 g/dL (ref 13.0–17.7)
Immature Granulocytes Absolute: 0 10*3/uL (ref 0.0–0.1)
Immature Granulocytes: 0 %
Lymphocytes Absolute: 1.5 10*3/uL (ref 0.7–3.1)
Lymphocytes Automated: 34 %
MCH: 28.9 pg (ref 26.6–33.0)
MCHC: 31.3 g/dL — ABNORMAL LOW (ref 31.5–35.7)
MCV: 92 fL (ref 79–97)
Monocytes Absolute: 0.5 10*3/uL (ref 0.1–0.9)
Monocytes: 10 %
Neutrophils Absolute Count: 2.1 10*3/uL (ref 1.4–7.0)
Neutrophils: 48 %
Platelets: 163 10*3/uL (ref 150–450)
RBC: 4.54 x10E6/uL (ref 4.14–5.80)
RDW: 12.6 % (ref 11.6–15.4)
WBC: 4.4 10*3/uL (ref 3.4–10.8)

## 2023-10-14 LAB — COMPREHENSIVE METABOLIC PANEL
ALT: 19 [IU]/L (ref 0–44)
AST (SGOT): 22 [IU]/L (ref 0–40)
Albumin: 3.8 g/dL — ABNORMAL LOW (ref 3.9–4.9)
Alkaline Phosphatase: 65 [IU]/L (ref 44–121)
BUN / Creatinine Ratio: 11 (ref 10–24)
BUN: 8 mg/dL (ref 8–27)
Bilirubin, Total: 0.7 mg/dL (ref 0.0–1.2)
CO2: 24 mmol/L (ref 20–29)
Calcium: 8.4 mg/dL — ABNORMAL LOW (ref 8.6–10.2)
Chloride: 99 mmol/L (ref 96–106)
Creatinine: 0.7 mg/dL — ABNORMAL LOW (ref 0.76–1.27)
Globulin, Total: 2.6 g/dL (ref 1.5–4.5)
Glucose: 95 mg/dL (ref 70–99)
Potassium: 4.5 mmol/L (ref 3.5–5.2)
Protein, Total: 6.4 g/dL (ref 6.0–8.5)
Sodium: 135 mmol/L (ref 134–144)
eGFR: 100 mL/min/{1.73_m2} (ref >59)

## 2023-10-14 LAB — LIPID PANEL
Cholesterol / HDL Ratio: 2.5 {ratio} (ref 0.0–5.0)
Cholesterol: 101 mg/dL (ref 100–199)
HDL: 40 mg/dL (ref >39)
LDL Chol Calculated (NIH): 46 mg/dL (ref 0–99)
Triglycerides: 69 mg/dL (ref 0–149)
VLDL Calculated: 15 mg/dL (ref 5–40)

## 2023-10-14 LAB — STOOL OCCULT BLOOD, FECAL IMMUMOCHEMICAL TEST (FIT): Occult Blood, Fecal, IA: NEGATIVE

## 2023-10-15 LAB — PHI SCORE REFLEX
PSA, Free Pct: 12.6 %
PSA, Free: 0.61 ng/mL
Prostate Heath Index Score: 22.4
proPSA isoform 2: 6.2 pg/mL

## 2023-10-15 LAB — PSA TOTAL: PROSTATE-SPECIFIC AG(COMPLEXED): 4.8 ng/mL — ABNORMAL HIGH (ref 0.0–3.9)

## 2023-10-16 NOTE — Addendum Note (Signed)
 Addended by: REE ALOYSIUS NAKAI on: 10/16/2023 08:34 AM     Modules accepted: Orders

## 2023-11-02 ENCOUNTER — Encounter (INDEPENDENT_AMBULATORY_CARE_PROVIDER_SITE_OTHER): Payer: Self-pay | Admitting: Family Medicine

## 2023-11-02 ENCOUNTER — Ambulatory Visit (INDEPENDENT_AMBULATORY_CARE_PROVIDER_SITE_OTHER): Payer: No Typology Code available for payment source | Admitting: Family Medicine

## 2023-11-02 VITALS — BP 109/55 | HR 62 | Temp 97.5°F

## 2023-11-02 DIAGNOSIS — R053 Chronic cough: Secondary | ICD-10-CM

## 2023-11-02 DIAGNOSIS — R972 Elevated prostate specific antigen [PSA]: Secondary | ICD-10-CM

## 2023-11-02 DIAGNOSIS — J0100 Acute maxillary sinusitis, unspecified: Secondary | ICD-10-CM

## 2023-11-02 MED ORDER — AMOXICILLIN-POT CLAVULANATE 875-125 MG PO TABS
1.0000 | ORAL_TABLET | Freq: Two times a day (BID) | ORAL | 0 refills | Status: AC
Start: 2023-11-02 — End: 2023-11-09

## 2023-11-02 MED ORDER — FLUTICASONE PROPIONATE 50 MCG/ACT NA SUSP
2.0000 | Freq: Every day | NASAL | 5 refills | Status: AC
Start: 2023-11-02 — End: ?

## 2023-11-02 NOTE — Progress Notes (Signed)
 [facility]                       Date of Exam: 11/02/2023         Patient ID: [name] [name] is a 68 y.o. male.  Attending Physician: Mikayah Joy Leonardo R Jentzen Minasyan III, MD        Chief Complaint:    Chief Complaint   Patient presents with    Nasal Congestion    Cough         Assessment / Plan :    Assessment & Plan  Acute non-recurrent maxillary sinusitis  Acute persistent with worsening symptoms we will treat empirically for bacterial sinusitis with Augmentin .  Because of postnasal discharge to use Flonase  nasal spray use.  Orders:    amoxicillin -clavulanate (AUGMENTIN ) 875-125 MG per tablet; Take 1 tablet by mouth 2 (two) times daily for 7 days    fluticasone  (FLONASE ) 50 MCG/ACT nasal spray; 2 sprays by Nasal route daily    Chronic cough  Chronic stable, per patient coughing has resolved.  Reviewed last chart notes.  Monitor for now.       Elevated PSA  Chronic reviewed PSA levels lab work done recently and last year.  Will plan on getting urology evaluation  Orders:    Referral to Urology (); Future                           HPI:    HPI  Patient is here for congestion. We have Dari interpreter via video relay.     Over the past 1 week, sore throat and nasal congestion. Has post nasal drip. No cough but has some itchiness in his throat. Does have some runny nose. No fevers or chills. Reports that grand son has similar symptoms. Has tried taking and lemon and vitamin c products.     Patient was seen last visit for chronic cough.  Patient reports that he does not have any cough currently.  We had referred him to pulmonology but has yet to schedule an appointment.    Patient has an elevated PSA on outside most recent blood test.  He has had elevated PSAs in the past.  These seem to fluctuate.  We had sent him a message to make an appointment with urology but he has not done this yet.                  The following sections were reviewed this encounter by the provider:            ROS:    Review  of Systems   Constitutional:  Negative for chills and fever.   Gastrointestinal:  Negative for nausea and vomiting.                Vital Signs:    BP 109/55 (BP Site: Left arm, Patient Position: Sitting, Cuff Size: X-Large)   Pulse 62   Temp 97.5 F (36.4 C) (Oral)   SpO2 98%          Physical Exam:    Physical Exam  Constitutional:       Appearance: Normal appearance.   Cardiovascular:      Rate and Rhythm: Normal rate and regular rhythm.   Pulmonary:      Effort: Pulmonary effort is normal. No respiratory distress.      Breath sounds: Normal breath sounds.   Neurological:  General: No focal deficit present.      Mental Status: He is alert and oriented to person, place, and time.                        Follow-up:    Follow-up if symptoms worsen or fail to improve.        [address] R Galileah Piggee III, MD

## 2023-11-02 NOTE — Progress Notes (Signed)
 Have you seen any specialists/other providers since your last visit with us ?    No      The patient was informed that the following HM items are still outstanding:   Health Maintenance Due   Topic Date Due    Advance Directive on File  Never done    Shingrix Vaccine 50+ (2) 03/18/2023    INFLUENZA VACCINE  04/14/2023    COVID-19 Vaccine (2 - 2024-25 season) 05/15/2023

## 2023-11-10 ENCOUNTER — Ambulatory Visit (FREE_STANDING_LABORATORY_FACILITY): Payer: No Typology Code available for payment source | Admitting: Urology

## 2023-11-10 ENCOUNTER — Encounter (INDEPENDENT_AMBULATORY_CARE_PROVIDER_SITE_OTHER): Payer: Self-pay | Admitting: Urology

## 2023-11-10 VITALS — BP 124/74 | HR 97 | Temp 97.0°F | Ht 67.32 in | Wt 176.0 lb

## 2023-11-10 DIAGNOSIS — R972 Elevated prostate specific antigen [PSA]: Secondary | ICD-10-CM

## 2023-11-10 DIAGNOSIS — R350 Frequency of micturition: Secondary | ICD-10-CM

## 2023-11-10 DIAGNOSIS — R351 Nocturia: Secondary | ICD-10-CM

## 2023-11-10 LAB — URINALYSIS POCT
Urine Bilirubin POCT: NEGATIVE
Urine Glucose POCT: NEGATIVE mg/dL
Urine Ketones POCT: NEGATIVE mg/dL
Urine Nitrites POCT: NEGATIVE
Urine Protein POCT: NEGATIVE mg/dL
Urine Specific Gravity POCT: 1.01
Urine Urobilinogen POCT: 0.2 mg/dL (ref 0.2–2.0)
Urine pH POCT: 7 (ref 5.0–8.0)

## 2023-11-10 NOTE — Patient Instructions (Signed)
 Overall the PSA is quite stable over the last year.  He does have some significant cardiac issues.  On exam his prostate has no nodules and and is on the large size.  He does not have significant urinary symptoms.    I think it is safe to check a free and total PSA in 6 months and come back and see me at that time.  If the PSA continues to rise then an MRI of his prostate would be our next step    The patient and I discussed PSA and its use as a screening tool for prostate cancer. We discussed that PSA was initially designed to be a tumor marker, however in the early 1990's it was used to screen men for prostate cancer. We discussed that no screening tool is perfect. We also discussed there is no PSA value that I can guarantee that he does or does not have prostate cancer. We discussed that men with big prostates, recent infection or prostate manipulation with a catheter or other instrument can make the PSA increase.   We discussed the AUA recommendations on prostate cancer screening with DRE as well as with PSA. Screening must take into account the overall health of the patient as well as race and family history and the rate of change of PSA. We also discussed the European randomized prostate cancer screening study showing a decreased chance of dying of prostate cancer if you were in the screening arm. We also discussed the harm of screening being finding a cancer that may not cause the patient harm and the morbidities of the treatment for prostate cancer as well as the risk of infection and bleeding from a prostate biopsy, which is the only way to determine if there is prostate cancer.    AUA GUIDELINES ON PSA SCREENING  PSA screening in men under age 78 years is not recommended.  Routine screening in men between ages 59 to 28 years at average risk is not recommended.  For men ages 74 to 3 years, the decision to undergo PSA screening involves weighing the benefits of preventing prostate cancer mortality in 1 man  for every 781 men screened over a decade against the known potential harms associated with screening and treatment. For this reason, shared decision-making is recommended for men age 29 to 31 years that are considering PSA screening, and proceeding based on patients' values and preferences.  To reduce the harms of screening, a routine screening interval of two years or more may be preferred over annual screening in those men who have participated in shared decision-making and decided on screening. As compared to annual screening, it is expected that screening intervals of two years preserve the majority of the benefits and reduce over diagnosis and false positives.  Routine PSA screening is not recommended in men over age 47 or any man with less than a 10-15 year life expectancy.

## 2023-11-10 NOTE — Progress Notes (Signed)
 Subjective:      Patient ID: [name] [name] is a 68 y.o. male     Chief Complaint: Used Farsi interpreter on video     1.  Elevated PSA.  PSA in January 2025 was 4.8, PSA was 4.6 in  jan 2024, no older PSAs available    No family hx of prostate cancer.   No meds for BPH- good stream, nocturia x3-4  Daytime frequency but drinks 3 liters of fluid a day.  PVR equals 161 today but he did not try and empty his bladder completely    The following portions of the patient's history were reviewed and updated as appropriate: allergies, current medications, past family history, past medical history, past social history, past surgical history and problem list.    Review of Systems  Systems reviewed per the HPI and below:     History obtained from the patient     General ROS: no fevers, or chills     Gastrointestinal ROS: no abdominal pain, change in bowel habits     Musculoskeletal ROS:  no swelling to lower extremities, no back pain     Objective:   BP 124/74 (BP Site: Left arm, Patient Position: Sitting, Cuff Size: Medium)   Pulse 97   Temp 97 F (36.1 C) (Oral)   Ht 1.71 m (5' 7.32)   Wt 79.8 kg (176 lb)   BMI 27.30 kg/m    vital signs reviewed  Physical Exam   Constitutional: Pt is oriented to person, place, and time and well-developed, well-nourished, and in no distress.   Pulmonary/Chest: Effort normal.   Abdominal: Soft. Pt exhibits no distension and no mass. There is no tenderness. There is no rebound and no guarding.   GU: testicles normal size bilateral  Prostate 60 gm, no nodules        Lab Review   Reviewed results as listed below. Discussed findings with patient.     Results       Procedure Component Value Units Date/Time    Urinalysis POCT [[id number]]  (Abnormal) Collected: 11/10/23 0927    Specimen: Urine, Clean Catch Updated: 11/10/23 0928     Urine Color POCT Other     Urine Clarity POCT Clear     Urine pH POCT 7.0     Urine Leukocyte Esterase POCT Trace     Urine Nitrites POCT Negative     Urine  Protein POCT Negative mg/dL      Urine Glucose POCT Negative mg/dL      Urine Ketones POCT Negative mg/dL      Urine Urobilinogen POCT 0.2 mg/dL      Urine Bilirubin POCT Negative     Urine Blood POCT T r-intact     Urine Specific Gravity POCT 1.010            Lab Results   Component Value Date    PSA CANCELED 01/14/2023    PSA 3.308 01/14/2023    PSA 4.680 (H) 10/11/2022       Radiology Review   Reviewed results as listed below. Discussed results with the patient and answered questions to the best of my ability.     PVR= 161    Assessment:     1. Nocturia    2. Elevated PSA    3. Urinary frequency             Plan:     Patient Instructions   Overall the PSA is quite stable over the last year.  He does have some significant cardiac issues.  On exam his prostate has no nodules and and is on the large size.  He does not have significant urinary symptoms.    I think it is safe to check a free and total PSA in 6 months and come back and see me at that time.  If the PSA continues to rise then an MRI of his prostate would be our next step    The patient and I discussed PSA and its use as a screening tool for prostate cancer. We discussed that PSA was initially designed to be a tumor marker, however in the early 1990's it was used to screen men for prostate cancer. We discussed that no screening tool is perfect. We also discussed there is no PSA value that I can guarantee that he does or does not have prostate cancer. We discussed that men with big prostates, recent infection or prostate manipulation with a catheter or other instrument can make the PSA increase.   We discussed the AUA recommendations on prostate cancer screening with DRE as well as with PSA. Screening must take into account the overall health of the patient as well as race and family history and the rate of change of PSA. We also discussed the European randomized prostate cancer screening study showing a decreased chance of dying of prostate cancer if you  were in the screening arm. We also discussed the harm of screening being finding a cancer that may not cause the patient harm and the morbidities of the treatment for prostate cancer as well as the risk of infection and bleeding from a prostate biopsy, which is the only way to determine if there is prostate cancer.    AUA GUIDELINES ON PSA SCREENING  PSA screening in men under age 46 years is not recommended.  Routine screening in men between ages 49 to 68 years at average risk is not recommended.  For men ages 56 to 58 years, the decision to undergo PSA screening involves weighing the benefits of preventing prostate cancer mortality in 1 man for every 781 men screened over a decade against the known potential harms associated with screening and treatment. For this reason, shared decision-making is recommended for men age 34 to 103 years that are considering PSA screening, and proceeding based on patients' values and preferences.  To reduce the harms of screening, a routine screening interval of two years or more may be preferred over annual screening in those men who have participated in shared decision-making and decided on screening. As compared to annual screening, it is expected that screening intervals of two years preserve the majority of the benefits and reduce over diagnosis and false positives.  Routine PSA screening is not recommended in men over age 24 or any man with less than a 10-15 year life expectancy.     Orders  Orders Placed This Encounter   Procedures    PSA, Total And Free     Standing Status:   Future     Standing Expiration Date:   11/09/2024     Order Specific Question:   Release to patient     Answer:   Immediate       This note was generated by the Epic EMR system/ Dragon speech recognition and may contain inherent errors or omissions not intended by the user. Grammatical errors, random word insertions, deletions, pronoun errors and incomplete sentences are occasional consequences of this  technology due to software limitations. Not all  errors are caught or corrected. If there are questions or concerns about the content of this note or information contained within the body of this dictation they should be addressed directly with the author for clarification

## 2023-12-02 ENCOUNTER — Telehealth: Payer: Self-pay | Admitting: Family Medicine

## 2023-12-02 NOTE — Telephone Encounter (Signed)
 Copied from CRM #[id number]. Topic: Non-Appointment Question - More Information  >> Dec 02, 2023  3:05 PM Denaisha P wrote:  [name], [name] called about Non-Appointment Question - More Information.  Additional details:Pt daughter in law Darrick Grinder called requesting recommendations for ENT doctors in [place].

## 2023-12-08 NOTE — Telephone Encounter (Signed)
 Could not return call, Pt daughter in law is not on disclosure form.

## 2024-02-02 ENCOUNTER — Emergency Department
Admission: EM | Admit: 2024-02-02 | Discharge: 2024-02-02 | Disposition: A | Discharge status: Home / Self Care | Attending: Emergency Medicine | Admitting: Emergency Medicine

## 2024-02-02 DIAGNOSIS — R42 Dizziness and giddiness: Secondary | ICD-10-CM | POA: Insufficient documentation

## 2024-02-02 LAB — LAB USE ONLY - CBC WITH DIFFERENTIAL
Absolute Basophils: 0.02 x10 3/uL (ref 0.00–0.08)
Absolute Eosinophils: 0.21 x10 3/uL (ref 0.00–0.44)
Absolute Immature Granulocytes: 0.02 x10 3/uL (ref 0.00–0.07)
Absolute Lymphocytes: 1.28 x10 3/uL (ref 0.42–3.22)
Absolute Monocytes: 0.51 x10 3/uL (ref 0.21–0.85)
Absolute Neutrophils: 4.32 x10 3/uL (ref 1.10–6.33)
Absolute nRBC: 0 x10 3/uL (ref <=0.00)
Basophils %: 0.3 %
Eosinophils %: 3.3 %
Hematocrit: 40.5 % (ref 37.6–49.6)
Hemoglobin: 13.5 g/dL (ref 12.5–17.1)
Immature Granulocytes %: 0.3 %
Lymphocytes %: 20.1 %
MCH: 29.9 pg (ref 25.1–33.5)
MCHC: 33.3 g/dL (ref 31.5–35.8)
MCV: 89.8 fL (ref 78.0–96.0)
MPV: 11 fL (ref 8.9–12.5)
Monocytes %: 8 %
Neutrophils %: 68 %
Platelet Count: 203 x10 3/uL (ref 142–346)
Preliminary Absolute Neutrophil Count: 4.32 x10 3/uL (ref 1.10–6.33)
RBC: 4.51 x10 6/uL (ref 4.20–5.90)
RDW: 13 % (ref 11–15)
WBC: 6.36 x10 3/uL (ref 3.10–9.50)
nRBC %: 0 /100{WBCs} (ref <=0.0)

## 2024-02-02 LAB — BASIC METABOLIC PANEL
Anion Gap: 6 (ref 5.0–15.0)
BUN: 15 mg/dL (ref 9–28)
CO2: 27 meq/L (ref 17–29)
Calcium: 9.6 mg/dL (ref 7.9–10.2)
Chloride: 104 meq/L (ref 99–111)
Creatinine: 0.8 mg/dL (ref 0.5–1.5)
GFR: >60 mL/min/1.73 m2 (ref >=60.0)
Glucose: 90 mg/dL (ref 70–100)
Potassium: 4.4 meq/L (ref 3.5–5.3)
Sodium: 137 meq/L (ref 135–145)

## 2024-02-02 LAB — ECG 12-LEAD
Atrial Rate: 66 {beats}/min
IHS MUSE NARRATIVE AND IMPRESSION: NORMAL
P Axis: 69 degrees
P-R Interval: 200 ms
Q-T Interval: 368 ms
QRS Duration: 80 ms
QTC Calculation (Bezet): 385 ms
R Axis: -4 degrees
T Axis: 10 degrees
Ventricular Rate: 66 {beats}/min

## 2024-02-02 LAB — HIGH SENSITIVITY TROPONIN-I WITH DELTA
hs Troponin-I Delta: 2
hs Troponin: 10.7 ng/L (ref <=35.0)

## 2024-02-02 LAB — WHOLE BLOOD GLUCOSE POCT: Whole Blood Glucose POCT: 108 mg/dL — ABNORMAL HIGH (ref 70–100)

## 2024-02-02 LAB — HIGH SENSITIVITY TROPONIN-I: hs Troponin: 9.2 ng/L (ref <=35.0)

## 2024-02-02 MED ORDER — SODIUM CHLORIDE 0.9 % IV BOLUS
250.0000 mL | Freq: Once | INTRAVENOUS | Status: AC
Start: 2024-02-02 — End: 2024-02-02
  Administered 2024-02-02: 250 mL via INTRAVENOUS
  Filled 2024-02-02: qty 250

## 2024-02-02 NOTE — ED Provider Notes (Signed)
 EMERGENCY DEPARTMENT NOTE     Patient initially seen and examined at   ED PHYSICIAN ASSIGNED       Date/Time Event User Comments    02/02/24 1353 Physician Assigned [place] Madding, ELLIE Arlander ELLIE, MD assigned as Attending           ED MIDLEVEL (APP) ASSIGNED       None            HISTORY OF PRESENT ILLNESS     Chief Complaint: Dizziness       History of Present Illness    68 year old male with below past ministry presents with lightheadedness and nausea this morning before eating breakfast, patient had exercise prior to that with no symptoms during exercise, no chest pain or shortness of breath, no fever cough or vomiting diarrhea, no lower extremity edema, checked bp today with sbp 140 and took metoprolol  this morning    Independent Historian (other than patient): Spouse  Additional History Provided by Independent Historian: medical history    MEDICAL HISTORY     Past Medical History:  Past Medical History:   Diagnosis Date    Abnormal vision     Coronary artery disease     Hearing loss     both ears    Hyperlipidemia     Myocardial infarction (CMS/HCC)     Seasonal allergic rhinitis     Vitamin D  deficiency        Past Surgical History:  Past Surgical History[1]    Social History:  Social History[2]    Family History:  Family History[3]    Outpatient Medication:  Discharge Medication List as of 02/02/2024  5:00 PM        CONTINUE these medications which have NOT CHANGED    Details   aspirin  EC 81 MG EC tablet Take 1 tablet (81 mg) by mouth daily, Starting Thu 07/14/2023, Until Sun 07/08/2024, E-Rx      atorvastatin  (LIPITOR) 40 MG tablet Take 1 tablet (40 mg) by mouth nightly, Starting Thu 07/14/2023, Until Sun 07/08/2024, E-Rx      Brilinta  90 MG Tab Take 1 tablet (90 mg) by mouth every 12 (twelve) hours, Starting Sat 08/06/2023, Historical Med      fluticasone  (FLONASE ) 50 MCG/ACT nasal spray 2 sprays by Nasal route daily, Starting Wed 11/02/2023, Print      metoprolol  succinate XL (TOPROL -XL) 25 MG 24 hr tablet Take 1  tablet (25 mg) by mouth daily, Starting Thu 07/14/2023, E-Rx      Vitamin D3 (CHOLECALCIFEROL) 50 MCG (2000 UT) tablet Take 1 tablet (50 mcg) by mouth daily, Starting Sun 02/13/2023, E-Rx               REVIEW OF SYSTEMS   See History of Present Illness    PHYSICAL EXAM     ED Triage Vitals   Encounter Vitals Group      BP 02/02/24 1331 136/74      Systolic BP Percentile --       Diastolic BP Percentile --       Heart Rate 02/02/24 1331 71      Resp Rate 02/02/24 1331 18      Temp 02/02/24 1331 97.9 F (36.6 C)      Temp src 02/02/24 1331 Oral      SpO2 02/02/24 1331 97 %      Weight --       Height --       Head Circumference --  Peak Flow --       Pain Score 02/02/24 1420 0      Pain Loc --       Pain Education --       Exclude from Growth Chart --        Physical Exam  Vitals and nursing note reviewed.   Cardiovascular:      Rate and Rhythm: Normal rate and regular rhythm.   Pulmonary:      Effort: Pulmonary effort is normal. No respiratory distress.      Breath sounds: No wheezing.   Abdominal:      Palpations: Abdomen is soft.      Tenderness: There is no abdominal tenderness.   Neurological:      Mental Status: He is alert.      Comments: CN II-XII intact, finger to nose normal, no pronator drift  Stable gait         HPI & MEDICAL DECISION MAKING       Chief Complaint: Dizziness       PRIMARY PROBLEM LIST     Acute illness/injury with risk to life or bodily function (based on differential diagnosis or evaluation) Severe   Chronic Illness Impacting Care of the above problem: Coronary Artery Disease Increases complexity of evaluation and Increases the risk of severe disease  Differential Diagnosis in MDM below      DISCUSSION        Ddx: dehydration, electrolyte imbalance, lower suspicion for ACS, don't suspect cva, plan for labwork, fluids for dispo.        At discharge patient feels better, walking around comfortably. I have discussed all testing results and plan of care with patient. All questions  solicited and addressed. Possibility of evolving illness reviewed. Patient agrees with going home, following up and returning if worsening symptoms.            Vital Signs: Reviewed the patient's vital signs.   Nursing Notes: Reviewed and utilized available nursing notes.  Counseling: The emergency provider has spoken with the patient and discussed today's findings, in addition to providing specific details for the plan of care.  Questions are answered and there is agreement with the plan.        If patient is being hospitalized is severe sepsis or septic shock suspected?: N/A        External Records Reviewed?: Inpatient Records  Additional Notes                    MIPS DOCUMENTATION                CARDIAC STUDIES    The following cardiac studies were independently interpreted by the Emergency Medicine Physician.  For full cardiac study results please see chart.    EKG:  Interpreted by the EP.   Time Interpreted: 1436   Rate: 66   Interpretation: NSR, NAD, no ST abnormality, normal intervals, no TWI   Comparison: twi of avf normalized from 10/24        RADIOLOGY IMAGING STUDIES      No orders to display       EMERGENCY DEPT. MEDICATIONS      ED Medication Orders (From admission, onward)      Start Ordered     Status Ordering Provider    02/02/24 1430 02/02/24 1429  sodium chloride  0.9 % bolus 250 mL  Once        Route: Intravenous  Ordered Dose: 250 mL  Last MAR action: Stopped Xochilth Standish            LABORATORY RESULTS    Ordered and independently interpreted AVAILABLE laboratory tests.   Results for orders placed or performed during the hospital encounter of 02/02/24 (from the past 24 hours)    Collection Time: 02/02/24  1:45 PM   Result Value    Whole Blood Glucose POCT 108 (H)   Basic Metabolic Panel    Collection Time: 02/02/24  2:45 PM   Result Value    Glucose 90    BUN 15    Creatinine 0.8    Calcium  9.6    Sodium 137    Potassium 4.4    Chloride 104    CO2 27    Anion Gap 6.0    GFR >60.0   High  Sensitivity Troponin-I    Collection Time: 02/02/24  2:45 PM   Result Value    hs Troponin 9.2   CBC with Differential (Component)    Collection Time: 02/02/24  2:45 PM   Result Value    WBC 6.36    Hemoglobin 13.5    Hematocrit 40.5    Platelet Count 203    MPV 11.0    RBC 4.51    MCV 89.8    MCH 29.9    MCHC 33.3    RDW 13    nRBC % 0.0    Absolute nRBC 0.00    Preliminary Absolute Neutrophil Count 4.32    Neutrophils % 68.0    Lymphocytes % 20.1    Monocytes % 8.0    Eosinophils % 3.3    Basophils % 0.3    Immature Granulocytes % 0.3    Absolute Neutrophils 4.32    Absolute Lymphocytes 1.28    Absolute Monocytes 0.51    Absolute Eosinophils 0.21    Absolute Basophils 0.02    Absolute Immature Granulocytes 0.02   High Sensitivity Troponin-I with calculated Delta    Collection Time: 02/02/24  4:23 PM   Result Value    hs Troponin 10.7    hs Troponin-I Delta 2         CRITICAL CARE/PROCEDURES    Procedures    DIAGNOSIS      Diagnosis:  Final diagnoses:   Lightheadedness       Disposition:  ED Disposition       ED Disposition   Discharge    Condition   --    Date/Time   Thu Feb 02, 2024  5:00 PM    Comment   [facility] discharge to home/self care.    Condition at disposition: Stable                 Prescriptions:  Discharge Medication List as of 02/02/2024  5:00 PM        CONTINUE these medications which have NOT CHANGED    Details   aspirin  EC 81 MG EC tablet Take 1 tablet (81 mg) by mouth daily, Starting Thu 07/14/2023, Until Sun 07/08/2024, E-Rx      atorvastatin  (LIPITOR) 40 MG tablet Take 1 tablet (40 mg) by mouth nightly, Starting Thu 07/14/2023, Until Sun 07/08/2024, E-Rx      Brilinta  90 MG Tab Take 1 tablet (90 mg) by mouth every 12 (twelve) hours, Starting Sat 08/06/2023, Historical Med      fluticasone  (FLONASE ) 50 MCG/ACT nasal spray 2 sprays by Nasal route daily, Starting Wed 11/02/2023, Print      metoprolol  succinate XL (TOPROL -XL)  25 MG 24 hr tablet Take 1 tablet (25 mg) by mouth daily, Starting Thu  07/14/2023, E-Rx      Vitamin D3 (CHOLECALCIFEROL) 50 MCG (2000 UT) tablet Take 1 tablet (50 mcg) by mouth daily, Starting Sun 02/13/2023, E-Rx             Please pardon any potential grammatical errors or typos as aspects of this note may have been created through speech-to-text software.          [1]   Past Surgical History:  Procedure Laterality Date    LEFT HEART CATH POSS PCI Left 09/11/2022    Procedure: Left Heart Cath Poss PCI;  Surgeon: Claudene Katheren BROCKS, MD;  Location: [facility];  Service: Cardiovascular;  Laterality: Left;    LEFT HEART CATH POSS PCI Left 09/29/2022    Procedure: Left Heart Cath Poss PCI;  Surgeon: Garry CHRISTELLA Better, MD;  Location: [facility];  Service: Cardiovascular;  Laterality: Left;    NOSE SURGERY     [2]   Social History  Socioeconomic History    Marital status: Married   Occupational History    Occupation: retired     Comment: Engineer, building services   Tobacco Use    Smoking status: Never    Smokeless tobacco: Never   Vaping Use    Vaping status: Never Used   Substance and Sexual Activity    Alcohol use: Not Currently    Drug use: Never   Social History Narrative    Lives with wife, daughter in law and son.     And 2 sons 2 daughter      Social Drivers of Psychologist, prison and probation services Strain: Low Risk  (07/27/2023)    Overall Financial Resource Strain (CARDIA)     Difficulty of Paying Living Expenses: Not very hard   Food Insecurity: Food Insecurity Present (07/27/2023)    Hunger Vital Sign     Worried About Running Out of Food in the Last Year: Sometimes true     Ran Out of Food in the Last Year: Sometimes true   Transportation Needs: Unmet Transportation Needs (07/27/2023)    PRAPARE - Therapist, art (Medical): Yes     Lack of Transportation (Non-Medical): Yes   Physical Activity: Sufficiently Active (07/27/2023)    Exercise Vital Sign     Days of Exercise per Week: 3 days     Minutes of Exercise per Session: 140 min   Stress: No Stress Concern  Present (07/27/2023)    [employer] of Occupational Health - Occupational Stress Questionnaire     Feeling of Stress : Not at all   Social Connections: Unknown (07/27/2023)    Social Connection and Isolation Panel [NHANES]     Frequency of Communication with Friends and Family: More than three times a week     Frequency of Social Gatherings with Friends and Family: Twice a week     Attends Religious Services: Patient declined     Database administrator or Organizations: No     Attends Banker Meetings: Never     Marital Status: Married   Catering manager Violence: Not At Risk (07/27/2023)    Humiliation, Afraid, Rape, and Kick questionnaire     Fear of Current or Ex-Partner: No     Emotionally Abused: No     Physically Abused: No     Sexually Abused: No   Housing Stability: Low Risk  (  07/27/2023)    Housing Stability Vital Sign     Unable to Pay for Housing in the Last Year: No     Number of Times Moved in the Last Year: 0     Homeless in the Last Year: No   [3] No family history on file.       Arlander Leader, MD  02/03/24 [phone]

## 2024-02-14 ENCOUNTER — Other Ambulatory Visit (INDEPENDENT_AMBULATORY_CARE_PROVIDER_SITE_OTHER): Payer: Self-pay | Admitting: Family Medicine

## 2024-02-14 DIAGNOSIS — E559 Vitamin D deficiency, unspecified: Secondary | ICD-10-CM

## 2024-02-15 ENCOUNTER — Other Ambulatory Visit (INDEPENDENT_AMBULATORY_CARE_PROVIDER_SITE_OTHER): Payer: Self-pay | Admitting: Family Medicine

## 2024-02-15 DIAGNOSIS — E559 Vitamin D deficiency, unspecified: Secondary | ICD-10-CM

## 2024-04-12 ENCOUNTER — Encounter (INDEPENDENT_AMBULATORY_CARE_PROVIDER_SITE_OTHER): Payer: Self-pay | Admitting: Student in an Organized Health Care Education/Training Program

## 2024-04-12 ENCOUNTER — Ambulatory Visit (INDEPENDENT_AMBULATORY_CARE_PROVIDER_SITE_OTHER): Admitting: Student in an Organized Health Care Education/Training Program

## 2024-04-12 VITALS — BP 113/67 | HR 60 | Temp 98.2°F | Wt 172.0 lb

## 2024-04-12 DIAGNOSIS — D500 Iron deficiency anemia secondary to blood loss (chronic): Secondary | ICD-10-CM

## 2024-04-12 DIAGNOSIS — R972 Elevated prostate specific antigen [PSA]: Secondary | ICD-10-CM

## 2024-04-12 DIAGNOSIS — I2510 Atherosclerotic heart disease of native coronary artery without angina pectoris: Secondary | ICD-10-CM

## 2024-04-12 NOTE — Progress Notes (Signed)
 [facility]  OFFICE VISIT               Chief Complaint   Patient presents with    Follow-up     Pt is here today f/up on from his specialist   urology        HPI: [name] [name] is a 68 y.o. with history of CAD s/p MI, enlarged prostate, HLD, presenting today for urology follow up. Seen by urology in Feb for elevated PSA. No red flag symptoms were noted but found to have a large prostate. Repeat PSA about 4 months later was WNL. Denies urinary symptoms like dysuria, decreased stream, increased frequency. Gets up 1-2 times at night. Has appt on 8/25 with urology to discuss the prostate further. Thinks he needs to have blood work done. Requesting blood work for prostate, iron levels and liver.    History of CAD. Follows with Dr. Claudene, cardiology. Angiogram done in 2023 s/p MI.  Plant to follow up with cardiology soon and would like them to see updated blood work. Currentl taking Toprol  XL 25 mg daily, ASA 81 mg daily, Brilinta  90 mg daily and Lipitor 40 mg. Denies chest pain, SOB, lightheadedness, dizziness.    ROS: See HPI.     Medical History[1]    EXAM:  BP 113/67 (BP Site: Right arm, Patient Position: Sitting, Cuff Size: Medium)   Pulse 60   Temp 98.2 F (36.8 C) (Temporal)   Wt 78 kg (172 lb)   SpO2 98%   BMI 26.68 kg/m     Physical Exam  Vitals and nursing note reviewed.   Constitutional:       Appearance: Normal appearance.   HENT:      Head: Normocephalic and atraumatic.      Right Ear: External ear normal.      Left Ear: External ear normal.      Nose: Nose normal.      Mouth/Throat:      Pharynx: Oropharynx is clear.   Eyes:      Conjunctiva/sclera: Conjunctivae normal.   Cardiovascular:      Rate and Rhythm: Normal rate.   Pulmonary:      Effort: Pulmonary effort is normal.   Neurological:      Mental Status: He is alert and oriented to person, place, and time.   Psychiatric:         Mood and Affect: Mood normal.         Behavior: Behavior normal.        IMAGING &  LABS:   Lab Results   Component Value Date    PSA CANCELED 01/14/2023    PSA 3.308 01/14/2023    PSA 4.680 (H) 10/11/2022     Lab Results   Component Value Date    WBC 6.36 02/02/2024    HGB 13.5 02/02/2024    HCT 40.5 02/02/2024    MCV 89.8 02/02/2024    PLT 203 02/02/2024     '  Chemistry        Component Value Date/Time    NA 137 02/02/2024 1445    NA 135 10/13/2023 0000    K 4.4 02/02/2024 1445    K 4.5 10/13/2023 0000    CL 104 02/02/2024 1445    CL 99 10/13/2023 0000    CO2 27 02/02/2024 1445    CO2 24 10/13/2023 0000    BUN 15 02/02/2024 1445    BUN 8 10/13/2023 0000    CREAT  0.8 02/02/2024 1445    CREAT 0.70 (L) 10/13/2023 0000    GLU 90 02/02/2024 1445    GLU 95 10/13/2023 0000    GLU 97 02/16/2023 0905        Component Value Date/Time    CA 9.6 02/02/2024 1445    CA 8.4 (L) 10/13/2023 0000    ALKPHOS 65 10/13/2023 0000    AST 22 10/13/2023 0000    ALT 19 10/13/2023 0000    BILITOTAL 0.7 10/13/2023 0000          ASSESSMENT and PLAN:  1. Elevated PSA (asymptomatic)  - Following with urology. Next app 05/07/24  - PSA previously ordered.     2. Iron deficiency anemia due to chronic blood loss  - Iron Profile; Future  - Ferritin; Future    3. Coronary artery disease involving native coronary artery of native heart without angina pectoris  - Comprehensive Metabolic Panel; Future     Keenon Leitzel O. Rayshaun Needle, DO, MPH         [1]   Past Medical History:  Diagnosis Date    Abnormal vision     Coagulation disorder     Coronary artery disease     Hearing loss     both ears    Hyperlipidemia     Myocardial infarction (CMS/HCC)     Seasonal allergic rhinitis     Vitamin D  deficiency

## 2024-04-12 NOTE — Progress Notes (Signed)
 Have you seen any specialist/other providers since your last visit with [place]?  Yes    Are you located in the state of [place]?  Yes

## 2024-04-13 LAB — IRON PROFILE
Iron Saturation: 34 % (ref 15–55)
Iron: 99 ug/dL (ref 38–169)
TIBC: 287 ug/dL (ref 250–450)
UIBC: 188 ug/dL (ref 111–343)

## 2024-04-13 LAB — COMPREHENSIVE METABOLIC PANEL
ALT: 31 IU/L (ref 0–44)
AST (SGOT): 29 IU/L (ref 0–40)
Albumin: 4.1 g/dL (ref 3.9–4.9)
Alkaline Phosphatase: 72 IU/L (ref 44–121)
BUN / Creatinine Ratio: 23 (ref 10–24)
BUN: 18 mg/dL (ref 8–27)
Bilirubin, Total: 0.9 mg/dL (ref 0.0–1.2)
CO2: 20 mmol/L (ref 20–29)
Calcium: 9 mg/dL (ref 8.6–10.2)
Chloride: 104 mmol/L (ref 96–106)
Creatinine: 0.78 mg/dL (ref 0.76–1.27)
Globulin, Total: 2.3 g/dL (ref 1.5–4.5)
Glucose: 86 mg/dL (ref 70–99)
Potassium: 4.2 mmol/L (ref 3.5–5.2)
Protein, Total: 6.4 g/dL (ref 6.0–8.5)
Sodium: 139 mmol/L (ref 134–144)
eGFR: 96 mL/min/1.73 (ref >59)

## 2024-04-13 LAB — PSA, TOTAL AND FREE
PSA, Free Pct: 13 %
PSA, Free: 0.48 ng/mL
Prostate Specific Antigen, Total: 3.7 ng/mL (ref 0.0–4.0)

## 2024-04-13 LAB — FERRITIN: Ferritin: 153 ng/mL (ref 30–400)

## 2024-04-16 ENCOUNTER — Ambulatory Visit (INDEPENDENT_AMBULATORY_CARE_PROVIDER_SITE_OTHER): Payer: Self-pay | Admitting: Student in an Organized Health Care Education/Training Program

## 2024-04-17 ENCOUNTER — Encounter (INDEPENDENT_AMBULATORY_CARE_PROVIDER_SITE_OTHER): Payer: Self-pay | Admitting: Cardiology

## 2024-04-17 NOTE — Progress Notes (Signed)
 THIS FORM IS FOR INTERNAL USE FOR CARDIOLOGY AND IS NOT A PROGRESS NOTE    Chart Prep Check List:  Completed Process Reviewed Notes   [x]  Review last office visit note. If "orders placed, ensure that the results are in the chart.    Refresh CareEverywhere- outside results may appear here.   If not in chart:         - Call the patient and clarify if they have done testing outside of [facility].             - If the patient has completed testing outside of [facility], request that the patient bring the results to their appointment or ask for facility information so you can call and request results to be faxed to clinic. Once faxed, scanned into chart.  - If unable to obtain, call patient and request for patient to bring physical copy to [facility] or provide in MyChart message.    If the patient has not completed testing, confirm with the provider if it is okay to proceed with the scheduled appointment or if the patient needs to schedule testing and follow up afterward. Contact designated PAA to reschedule appointment and diagnostic testing. ED 02/02/24: Lightheadedness, Dizziness   [x]  Check to make sure all recent labs are available/completed  If PCP is within [facility], will appear in Epic.   If outside of [facility], call to obtain records. Put in blue sticky note labs are scanned, specify lab, and date of labs.   If no PCP in chart, call patient to confirm no recent labs from outside facility. CMP 04/12/24, CBC, Lipid 10/13/23, LFT 04/28/23   [x]  Pend EKG, if needed  If EKG is completed, please provide date of last EKG in blue sticky note  last 02/02/24   []  If cardiac clearance:  Add physician/surgeon to care team  Document physician, name of procedure, and fax to the right and in  blue sticky note   Check media and concord for cardiac clearance request. If in concord, upload the request to chart.         Disclaimer: If calling patient, please ensure check list is completed prior to have all information/ questions readily available.

## 2024-05-02 ENCOUNTER — Encounter (INDEPENDENT_AMBULATORY_CARE_PROVIDER_SITE_OTHER): Payer: Self-pay | Admitting: Cardiology

## 2024-05-02 ENCOUNTER — Ambulatory Visit (INDEPENDENT_AMBULATORY_CARE_PROVIDER_SITE_OTHER): Admitting: Cardiology

## 2024-05-02 DIAGNOSIS — I2510 Atherosclerotic heart disease of native coronary artery without angina pectoris: Secondary | ICD-10-CM

## 2024-05-02 MED ORDER — METOPROLOL SUCCINATE ER 25 MG PO TB24
25.0000 mg | ORAL_TABLET | Freq: Every day | ORAL | 3 refills | Status: AC
Start: 2024-05-02 — End: ?

## 2024-05-02 MED ORDER — ASPIRIN 81 MG PO TBEC
81.0000 mg | DELAYED_RELEASE_TABLET | Freq: Every day | ORAL | 3 refills | Status: AC
Start: 2024-05-02 — End: 2025-04-27

## 2024-05-02 MED ORDER — ATORVASTATIN CALCIUM 40 MG PO TABS
40.0000 mg | ORAL_TABLET | Freq: Every evening | ORAL | 3 refills | Status: AC
Start: 2024-05-02 — End: 2025-04-27

## 2024-05-02 NOTE — Progress Notes (Signed)
 [facility] 68 y.o. with a history of coronary artery disease presents for cardiac follow-up of coronary artery disease.    Coronary artery disease: Inferior ST segment elevation myocardial infarction (09/11/2022) status post drug-eluting stent placement x 3 to the left circumflex artery 3.5 x 18, 3.5 x 18, 3 x 18 onyx [facility]).  He did undergo repeat cardiac cath (09/29/2022) for recurrent chest pain that noted patent left circumflex stent.  He is without chest pain or exertional dyspnea.  He is compliant with aspirin  81 mg daily,, Toprol -XL 25 mg daily and atorvastatin  40 mg nightly.      He denies shortness of breath, lower extreme edema, orthopnea or exertional chest pain.    Most recent cardiac workup is included    Echocardiogram (09/30/2022): Ejection fraction 49%.  Mild mitral regurgitation, tricuspid regurgitation and aortic regurgitation    Cardiac cath (09/11/2022)  LM       Angiographically normal  LAD     40% mid stenosis  LCx      20% proximal stenosis              100% mid stenosis with distal TIMI 0 flow  RCA     20% mid stenosis  LV        LVEF 50%; mid inferior wall hypokinesis; LVEDP 15 mmHg; no significant gradient across aortic valve    Fasting lipid profile (10/13/2023): Total cholesterol 101, HDL 40, LDL 46, triglycerides 69.  Currently on atorvastatin  40 mg daily          Current Outpatient Medications   Medication Sig Dispense Refill    fluticasone  (FLONASE ) 50 MCG/ACT nasal spray 2 sprays by Nasal route daily 16 g 5    Vitamin D3 (CHOLECALCIFEROL) 50 MCG (2000 UT) tablet Take 1 tablet (50 mcg) by mouth daily 90 tablet 3    aspirin  EC 81 MG EC tablet Take 1 tablet (81 mg) by mouth once daily 90 tablet 3    atorvastatin  (LIPITOR) 40 MG tablet Take 1 tablet (40 mg) by mouth once at bedtime 90 tablet 3    metoprolol  succinate XL (TOPROL -XL) 25 MG 24 hr tablet Take 1 tablet (25 mg) by mouth once daily 90 tablet 3     No current facility-administered medications for this visit.           PE:    Vitals:    05/02/24 1007   BP: 128/75   Pulse: 62   SpO2: 97%     Body mass index is 27.72 kg/m.    Physical Examination: General appearance - alert, well appearing, and in no distress  Mental status - normal mood, behavior, speech, dress, motor activity, and thought processes  Eyes - sclera anicteric  Chest - clear to auscultation, no wheezes, rales or rhonchi, symmetric air entry  Heart - normal rate and regular rhythm, S1 and S2 normal  Abdomen - not examined  Extremities - no pedal edema noted        Labs:  Lipid Panel   Cholesterol   Date/Time Value Ref Range Status   10/13/2023 12:00 AM 101 100 - 199 mg/dL Final     Triglycerides   Date/Time Value Ref Range Status   10/13/2023 12:00 AM 69 0 - 149 mg/dL Final   93/94/7975 90:94 AM 47 34 - 149 mg/dL Final     HDL   Date/Time Value Ref Range Status   10/13/2023 12:00 AM 40 >39 mg/dL Final       CMP:  Sodium   Date/Time Value Ref Range Status   04/12/2024 12:00 AM 139 134 - 144 mmol/L Final     Potassium   Date/Time Value Ref Range Status   04/12/2024 12:00 AM 4.2 3.5 - 5.2 mmol/L Final     Chloride   Date/Time Value Ref Range Status   04/12/2024 12:00 AM 104 96 - 106 mmol/L Final     CO2   Date/Time Value Ref Range Status   04/12/2024 12:00 AM 20 20 - 29 mmol/L Final     Glucose   Date/Time Value Ref Range Status   04/12/2024 12:00 AM 86 70 - 99 mg/dL Final   93/94/7975 90:94 AM 97 70 - 100 mg/dL Final     Comment:     ADA guidelines for diabetes mellitus:  Fasting:  Equal to or greater than 126 mg/dL  Random:   Equal to or greater than 200 mg/dL       BUN   Date/Time Value Ref Range Status   04/12/2024 12:00 AM 18 8 - 27 mg/dL Final     Protein, Total   Date/Time Value Ref Range Status   04/12/2024 12:00 AM 6.4 6.0 - 8.5 g/dL Final     Alkaline Phosphatase   Date/Time Value Ref Range Status   04/12/2024 12:00 AM 72 44 - 121 IU/L Final     AST (SGOT)   Date/Time Value Ref Range Status   04/12/2024 12:00 AM 29 0 - 40 IU/L Final     ALT   Date/Time  Value Ref Range Status   04/12/2024 12:00 AM 31 0 - 44 IU/L Final     Anion Gap   Date/Time Value Ref Range Status   02/02/2024 02:45 PM 6.0 5.0 - 15.0 Final     Comment:     Calculated Anion Gap = Na - (Cl + CO2)  Interpret with caution; calculated Anion Gap may not reflect patient's true clinical status.     This is a calculated value and platform-dependent. A value >12.0 has been recommended for the management of Hyperglycemic Crises: Diabetic Ketoacidosis and Hyperglycemic Hyperosmolar State.Med Clin [place]. 2017;101(3):587-606.doi,10.1016/j.mcna.2016.12.011   02/16/2023 09:05 AM 5.0 5.0 - 15.0 Final     Comment:     Calculated AGAP = Na - (CL + CO2)  Interpret with caution; calculated AGAP may not  reflect patient's true clinical status.  This is a calculated value and platform-dependent.  A value >12.0 has been recommended for the management of  Hyperglycemic Crises: Diabetic Ketoacidosis and Hyperglycemic  Hyperosmolar State. Med Clin [place]. 2017;101(3):587-606.  doi:10.1016/j.mcna.2016.12.011         CBC:   WBC   Date/Time Value Ref Range Status   02/02/2024 02:45 PM 6.36 3.10 - 9.50 x10 3/uL Final   10/13/2023 12:00 AM 4.4 3.4 - 10.8 x10E3/uL Final     RBC   Date/Time Value Ref Range Status   02/02/2024 02:45 PM 4.51 4.20 - 5.90 x10 6/uL Final   10/13/2023 12:00 AM 4.54 4.14 - 5.80 x10E6/uL Final     Hemoglobin   Date/Time Value Ref Range Status   02/02/2024 02:45 PM 13.5 12.5 - 17.1 g/dL Final   98/69/7974 87:99 AM 13.1 13.0 - 17.7 g/dL Final     Hematocrit   Date/Time Value Ref Range Status   02/02/2024 02:45 PM 40.5 37.6 - 49.6 % Final   10/13/2023 12:00 AM 41.9 37.5 - 51.0 % Final     MCV   Date/Time Value Ref Range Status  02/02/2024 02:45 PM 89.8 78.0 - 96.0 fL Final   10/13/2023 12:00 AM 92 79 - 97 fL Final     MCHC   Date/Time Value Ref Range Status   02/02/2024 02:45 PM 33.3 31.5 - 35.8 g/dL Final   98/69/7974 87:99 AM 31.3 (L) 31.5 - 35.7 g/dL Final     RDW   Date/Time Value Ref Range Status    02/02/2024 02:45 PM 13 11 - 15 % Final   10/13/2023 12:00 AM 12.6 11.6 - 15.4 % Final     Platelet Count   Date/Time Value Ref Range Status   02/02/2024 02:45 PM 203 142 - 346 x10 3/uL Final     Platelets   Date/Time Value Ref Range Status   10/13/2023 12:00 AM 163 150 - 450 x10E3/uL Final           Impression / plan    Coronary artery disease: Status post drug-eluting stent placement to the left circumflex x 3.  He is doing well from a cardiac standpoint.  Continue current regimen.  Follow-up with cardiology in 1 year          Katheren JAYSON Sharps, MD  05/02/2024

## 2024-05-07 ENCOUNTER — Ambulatory Visit (INDEPENDENT_AMBULATORY_CARE_PROVIDER_SITE_OTHER): Payer: No Typology Code available for payment source | Admitting: Urology

## 2024-05-07 ENCOUNTER — Encounter (INDEPENDENT_AMBULATORY_CARE_PROVIDER_SITE_OTHER): Payer: Self-pay | Admitting: Urology

## 2024-05-07 VITALS — BP 126/77 | HR 65 | Temp 97.8°F | Ht 64.96 in | Wt 174.0 lb

## 2024-05-07 DIAGNOSIS — R972 Elevated prostate specific antigen [PSA]: Secondary | ICD-10-CM

## 2024-05-07 LAB — URINALYSIS POCT
Urine Bilirubin POCT: NEGATIVE
Urine Blood POCT: NEGATIVE
Urine Glucose POCT: NEGATIVE mg/dL
Urine Ketones POCT: NEGATIVE mg/dL
Urine Nitrites POCT: NEGATIVE
Urine Protein POCT: NEGATIVE mg/dL
Urine Specific Gravity POCT: 1.01
Urine Urobilinogen POCT: 0.2 mg/dL (ref 0.2–2.0)
Urine pH POCT: 7 (ref 5.0–8.0)

## 2024-05-07 NOTE — Patient Instructions (Addendum)
 PSA has come down to normal range.  Based on your age and stable PSA I think we can follow-up in 1 year.  If your PSA is stable with your primary care provider you do not even need to come back and see us .

## 2024-05-07 NOTE — Progress Notes (Signed)
 Subjective:      Patient ID: [name] [name] is a 68 y.o. male     Chief Complaint: Used Farsi interpreter on video     1.  Elevated PSA.  PSA in January 2025 was 4.8, PSA was 4.6 in  jan 2024, no older PSAs available we rechecked his PSA in July 2025 and it was 3.7.  See below.    No family hx of prostate cancer.   No meds for BPH- good stream, nocturia x3-4  Daytime frequency but drinks 3 liters of fluid a day.      The following portions of the patient's history were reviewed and updated as appropriate: allergies, current medications, past family history, past medical history, past social history, past surgical history and problem list.    Review of Systems  Systems reviewed per the HPI and below:     History obtained from the patient     General ROS: no fevers, or chills     Gastrointestinal ROS: no abdominal pain, change in bowel habits     Musculoskeletal ROS:  no swelling to lower extremities, no back pain     Objective:   BP 126/77 (BP Site: Left arm, Patient Position: Sitting, Cuff Size: Medium)   Pulse 65   Temp 97.8 F (36.6 C) (Oral)   Ht 1.65 m (5' 4.96)   Wt 78.9 kg (174 lb)   BMI 28.99 kg/m    vital signs reviewed  Physical Exam   Constitutional: Pt is oriented to person, place, and time and well-developed, well-nourished, and in no distress.   Pulmonary/Chest: Effort normal.   Abdominal: Soft. Pt exhibits no distension and no mass. There is no tenderness. There is no rebound and no guarding.           Lab Review   Reviewed results as listed below. Discussed findings with patient.     Results for orders placed or performed in visit on 05/07/24 (from the past 24 hours)   Urinalysis POCT    Collection Time: 05/07/24 12:09 PM   Result Value    Urine Color POCT Yellow    Urine Clarity POCT Slightly Cloudy (A)    Urine pH POCT 7.0    Urine Leukocyte Esterase POCT Trace (A)    Urine Nitrites POCT Negative    Urine Protein POCT Negative    Urine Glucose POCT Negative    Urine Ketones POCT Negative     Urine Urobilinogen POCT 0.2    Urine Bilirubin POCT Negative    Urine Blood POCT Negative    Urine Specific Gravity POCT 1.010       Lab Results   Component Value Date    PSA 3.7 04/12/2024    PSA CANCELED 01/14/2023    PSA 3.308 01/14/2023    PSA 4.680 (H) 10/11/2022       Radiology Review   Reviewed results as listed below. Discussed results with the patient and answered questions to the best of my ability.         Assessment:     1. Elevated PSA               Plan:     Patient Instructions   PSA has come down to normal range.  Based on your age and stable PSA I think we can follow-up in 1 year.  If your PSA is stable with your primary care provider you do not even need to come back and see us .    Orders  Orders Placed This Encounter   Procedures    PSA, Total And Free     Standing Status:   Future     Expected Date:   11/07/2024     Expiration Date:   05/07/2025     Release to patient:   Immediate

## 2024-05-09 ENCOUNTER — Encounter (INDEPENDENT_AMBULATORY_CARE_PROVIDER_SITE_OTHER): Payer: Self-pay | Admitting: Urology

## 2024-05-09 DIAGNOSIS — R972 Elevated prostate specific antigen [PSA]: Secondary | ICD-10-CM

## 2024-05-16 ENCOUNTER — Other Ambulatory Visit (INDEPENDENT_AMBULATORY_CARE_PROVIDER_SITE_OTHER): Payer: Self-pay | Admitting: Family Medicine

## 2024-05-16 DIAGNOSIS — E559 Vitamin D deficiency, unspecified: Secondary | ICD-10-CM

## 2024-05-23 ENCOUNTER — Encounter (INDEPENDENT_AMBULATORY_CARE_PROVIDER_SITE_OTHER): Payer: Self-pay | Admitting: Family Medicine

## 2024-05-23 DIAGNOSIS — E559 Vitamin D deficiency, unspecified: Secondary | ICD-10-CM

## 2024-05-24 MED ORDER — VITAMIN D3 50 MCG (2000 UT) PO TABS: 1.0000 | ORAL_TABLET | Freq: Every day | ORAL | 1 refills | Status: DC

## 2024-06-15 ENCOUNTER — Other Ambulatory Visit (INDEPENDENT_AMBULATORY_CARE_PROVIDER_SITE_OTHER): Payer: Self-pay | Admitting: Family Medicine

## 2024-08-27 ENCOUNTER — Ambulatory Visit (INDEPENDENT_AMBULATORY_CARE_PROVIDER_SITE_OTHER): Admitting: Family Medicine

## 2024-08-27 ENCOUNTER — Encounter (INDEPENDENT_AMBULATORY_CARE_PROVIDER_SITE_OTHER): Payer: Self-pay | Admitting: Family Medicine

## 2024-08-27 VITALS — BP 128/79 | HR 60 | Temp 98.1°F | Resp 16 | Ht 66.73 in | Wt 171.4 lb

## 2024-08-27 DIAGNOSIS — R972 Elevated prostate specific antigen [PSA]: Secondary | ICD-10-CM | POA: Insufficient documentation

## 2024-08-27 DIAGNOSIS — Z139 Encounter for screening, unspecified: Secondary | ICD-10-CM

## 2024-08-27 DIAGNOSIS — Z758 Other problems related to medical facilities and other health care: Secondary | ICD-10-CM

## 2024-08-27 DIAGNOSIS — E559 Vitamin D deficiency, unspecified: Secondary | ICD-10-CM

## 2024-08-27 DIAGNOSIS — I429 Cardiomyopathy, unspecified: Secondary | ICD-10-CM

## 2024-08-27 DIAGNOSIS — H359 Unspecified retinal disorder: Secondary | ICD-10-CM | POA: Insufficient documentation

## 2024-08-27 DIAGNOSIS — Z603 Acculturation difficulty: Secondary | ICD-10-CM

## 2024-08-27 DIAGNOSIS — Z8615 Personal history of latent tuberculosis infection: Secondary | ICD-10-CM | POA: Insufficient documentation

## 2024-08-27 DIAGNOSIS — I2510 Atherosclerotic heart disease of native coronary artery without angina pectoris: Secondary | ICD-10-CM

## 2024-08-27 DIAGNOSIS — E1159 Type 2 diabetes mellitus with other circulatory complications: Secondary | ICD-10-CM | POA: Insufficient documentation

## 2024-08-27 MED ORDER — VITAMIN D3 50 MCG (2000 UT) PO TABS: 1.0000 | ORAL_TABLET | Freq: Every day | ORAL | 1 refills | Status: AC

## 2024-08-27 NOTE — Progress Notes (Signed)
 Language Spoken: English and dari  Interpreter ID#:  interpreter used    HPI:   This is a 68 y.o. male with hx of CAD s/p MI, elevated PSA/enlarged prostate, HLD who presents for:      Problem List Items Addressed This Visit       Coronary artery disease - Primary    Overview   05/02/24 Cards appt with Dr. 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 Cardiac cath 09/2022 for recurrent chest pain, patent stent at the time. Doing well, f/u 1 year, continue on atorvastatin  and aspirin , metop XL 25 mg daily         Cardiomyopathy, unspecified type (CMS/HCC)    History of latent tuberculosis    Overview   01/2023, prescribed isoniazid .         Elevated PSA    Overview   Urology 04/2024: has daytime urinary frequency, drinks 3L of fluid daily. Not on meds for BPH. PSA has normalized, f/u in 1 year         Vitamin D  deficiency    Overview   Vitamin D  25-Hydroxy (ng/mL)   Date Value   08/16/2022 23.7 (L)       Current regimen: 2000 IU daily           Relevant Medications    Cholecalciferol (Vitamin D3) 50 MCG (2000 UT) Tablet    Retina disorder    Overview   Sees [facility] [employer] [facility] , needs referral         Relevant Orders    Ambulatory referral to Ophthalmology (EXTERNAL)        Medical History[1]  Past Surgical History[2]  Family History[3]  Social History[4]    ALLERGIES:   Allergies[5]    MEDICATIONS:   Current Medications[6]    PHYSICAL EXAM:   BP 128/79 (BP Site: Right arm, Patient Position: Sitting, Cuff Size: Large)   Pulse 60   Temp 98.1 F (36.7 C) (Oral)   Resp 16   Ht 1.695 m (5' 6.73)   Wt 77.7 kg (171 lb 6.4 oz)   SpO2 99%   BMI 27.06 kg/m     Physical Exam    Constitutional: well-appearing, in no acute distress, sitting comfortably in the chair  HEENT: normocephalic/atraumatic. EOM grossly intact. MMM  NECK: supple  CARD: Regular rate  PULM: normal respiratory rate and effort.  GI: non-distended  MSK: moving all 4 extremities equally  SKIN: warm, dry. No visible rashes or  lesions  NEURO: awake and alert. Converses and answers questions appropriately. No gross deficits.  PSYCH: pleasant affect.     DIAGNOSTICS:     Lab Results   Component Value Date    WBC 6.36 02/02/2024    HGB 13.5 02/02/2024    HCT 40.5 02/02/2024    PLT 203 02/02/2024    CHOL 101 10/13/2023    TRIG 69 10/13/2023    HDL 40 10/13/2023    LDL 46 10/13/2023    ALT 31 04/12/2024    AST 29 04/12/2024    NA 139 04/12/2024    K 4.2 04/12/2024    CL 104 04/12/2024    CREAT 0.78 04/12/2024    BUN 18 04/12/2024    CO2 20 04/12/2024    PSA 3.7 04/12/2024    INR 1.1 09/29/2022    GLU 86 04/12/2024    HGBA1C 5.2 09/12/2022    ALKPHOS 72 04/12/2024       No results found.  ACTIVE PROBLEMS:   Problem List[7]    ASSESSMENT and PLAN:   68 y.o. male  with hx of CAD s/p MI, elevated PSA/enlarged prostate, HLD who presents for:      [name] [name] was seen today for establish care and annual exam.    Diagnoses and all orders for this visit:    Coronary artery disease involving native coronary artery of native heart without angina pectoris : chronic, stable, cont current med mgmt (see problem list/HPI for further detail)  - updated PL  - continue atorvastatin , aspriin, metop, does not need refills    Cardiomyopathy, unspecified type (CMS/HCC)    History of latent tuberculosis chronic, recurring.  - anticipatory guidance given (pt needed help finding notes to submit to [facility])    Retina disorder chronic, recurring. Unclear diagnosis, told by eye doctor they need referral to see ophtho  -     Ambulatory referral to Ophthalmology (EXTERNAL); Future    Elevated PSA chronic, recurring. Updated problem list    Vitamin D  deficiency chronic, recurring.  -     Cholecalciferol (Vitamin D3) 50 MCG (2000 UT) Tablet; Take 1 tablet by mouth once daily      Language barrier affecting healthcare  - Phone interpreter used as above or as documented by MA    The following SDOH drivers were screened within the last 14 days and results reviewedExercise,     Social,  Alcohol,Utilities,Literacy, Tobacco Depression,    .        Patient identified to have concerns in the following SDOH Drivers:    Transportation,Food,     Literacy,        .  Treatment course was impacted by these domains and the following interventions were performed to accommodate their impact: -  Tailored care-plan/instructions to accommodate SDOH needs           Return/ED precautions discussed. Reviewed medications prescribed including risks/benefits. Patient and/or parent of patient verbalized agreement and understanding of plan.    Follow up: Return in about 6 weeks (around 10/08/2024) for annual physical.     HCM: next visti (last annual physical 09/2023, will wait until new calendar year). Deferred vaccines today          [name] Miyamoto, MD  [facility]  August 27, 2024  11:25 AM           [1]   Past Medical History:  Diagnosis Date    Abnormal vision     Coagulation disorder     Coronary artery disease     Hearing loss     both ears    Hyperlipidemia     Myocardial infarction (CMS/HCC)     Seasonal allergic rhinitis     Vitamin D  deficiency    [2]   Past Surgical History:  Procedure Laterality Date    LEFT HEART CATH POSS PCI Left 09/11/2022    Procedure: Left Heart Cath Poss PCI;  Surgeon: Claudene Katheren BROCKS, MD;  Location: [facility];  Service: Cardiovascular;  Laterality: Left;    LEFT HEART CATH POSS PCI Left 09/29/2022    Procedure: Left Heart Cath Poss PCI;  Surgeon: Garry CHRISTELLA Better, MD;  Location: [facility];  Service: Cardiovascular;  Laterality: Left;    NOSE SURGERY     [3] No family history on file.  [4]   Social History  Tobacco Use    Smoking status: Never    Smokeless tobacco: Never   Vaping Use  Vaping status: Never Used   Substance Use Topics    Alcohol use: Not Currently    Drug use: Never   [5] No Known Allergies  [6]   Current Outpatient Medications:     aspirin  EC 81 MG EC tablet, Take 1 tablet (81 mg) by mouth once daily, Disp: 90 tablet, Rfl: 3     atorvastatin  (LIPITOR) 40 MG tablet, Take 1 tablet (40 mg) by mouth once at bedtime, Disp: 90 tablet, Rfl: 3    fluticasone  (FLONASE ) 50 MCG/ACT nasal spray, 2 sprays by Nasal route daily, Disp: 16 g, Rfl: 5    metoprolol  succinate XL (TOPROL -XL) 25 MG 24 hr tablet, Take 1 tablet (25 mg) by mouth once daily, Disp: 90 tablet, Rfl: 3    Cholecalciferol (Vitamin D3) 50 MCG (2000 UT) Tablet, Take 1 tablet by mouth once daily, Disp: 90 each, Rfl: 1  [7]   Patient Active Problem List  Diagnosis    Uses hearing aid    Bilateral chronic knee pain    Coronary artery disease    NSVT (nonsustained ventricular tachycardia) (CMS/HCC)    Bilateral sensorineural hearing loss    Cardiomyopathy, unspecified type (CMS/HCC)    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

## 2024-10-24 ENCOUNTER — Encounter (INDEPENDENT_AMBULATORY_CARE_PROVIDER_SITE_OTHER): Admitting: Family Medicine

## 2024-10-31 ENCOUNTER — Encounter (INDEPENDENT_AMBULATORY_CARE_PROVIDER_SITE_OTHER): Payer: No Typology Code available for payment source | Admitting: Family Medicine
